# Patient Record
Sex: Female | Born: 1950 | Race: Black or African American | Hispanic: No | Marital: Single | Smoking: Never smoker
Health system: Southern US, Community
[De-identification: ages and names within clinical notes are randomized; demographics above are authoritative.]

## PROBLEM LIST (undated history)

## (undated) DIAGNOSIS — I10 Essential (primary) hypertension: Secondary | ICD-10-CM

## (undated) DIAGNOSIS — B20 Human immunodeficiency virus [HIV] disease: Secondary | ICD-10-CM

---

## 2006-02-14 ENCOUNTER — Emergency Department: Payer: Self-pay | Admitting: Internal Medicine

## 2007-01-26 ENCOUNTER — Emergency Department: Payer: Self-pay | Admitting: Emergency Medicine

## 2007-03-27 ENCOUNTER — Emergency Department: Payer: Self-pay | Admitting: Emergency Medicine

## 2007-03-27 ENCOUNTER — Other Ambulatory Visit: Payer: Self-pay

## 2007-04-03 ENCOUNTER — Emergency Department: Payer: Self-pay | Admitting: Emergency Medicine

## 2007-12-12 ENCOUNTER — Emergency Department: Payer: Self-pay | Admitting: Emergency Medicine

## 2011-06-22 ENCOUNTER — Emergency Department: Payer: Self-pay | Admitting: *Deleted

## 2012-07-08 ENCOUNTER — Emergency Department: Payer: Self-pay | Admitting: Unknown Physician Specialty

## 2012-07-08 LAB — COMPREHENSIVE METABOLIC PANEL
Albumin: 2.5 g/dL — ABNORMAL LOW (ref 3.4–5.0)
Alkaline Phosphatase: 51 U/L (ref 50–136)
BUN: 4 mg/dL — ABNORMAL LOW (ref 7–18)
Calcium, Total: 8.9 mg/dL (ref 8.5–10.1)
Co2: 30 mmol/L (ref 21–32)
EGFR (African American): >60
EGFR (Non-African Amer.): >60
Glucose: 88 mg/dL (ref 65–99)
Osmolality: 265 (ref 275–301)
SGPT (ALT): 30 U/L (ref 12–78)
Sodium: 134 mmol/L — ABNORMAL LOW (ref 136–145)
Total Protein: 7.9 g/dL (ref 6.4–8.2)

## 2012-07-08 LAB — CBC
HGB: 14.2 g/dL (ref 12.0–16.0)
MCH: 33.6 pg (ref 26.0–34.0)
MCHC: 34.9 g/dL (ref 32.0–36.0)
MCV: 96 fL (ref 80–100)
Platelet: 208 10*3/uL (ref 150–440)
RBC: 4.23 10*6/uL (ref 3.80–5.20)
WBC: 4.4 10*3/uL (ref 3.6–11.0)

## 2012-07-08 LAB — LIPASE, BLOOD: Lipase: 239 U/L (ref 73–393)

## 2012-07-14 ENCOUNTER — Emergency Department: Payer: Self-pay | Admitting: Emergency Medicine

## 2012-07-14 LAB — CBC
HGB: 15.5 g/dL (ref 12.0–16.0)
Platelet: 284 10*3/uL (ref 150–440)
RBC: 4.68 10*6/uL (ref 3.80–5.20)

## 2012-07-14 LAB — COMPREHENSIVE METABOLIC PANEL
Alkaline Phosphatase: 55 U/L (ref 50–136)
Co2: 27 mmol/L (ref 21–32)
Creatinine: 0.93 mg/dL (ref 0.60–1.30)
EGFR (African American): >60
EGFR (Non-African Amer.): >60
Glucose: 81 mg/dL (ref 65–99)
Osmolality: 266 (ref 275–301)
Sodium: 135 mmol/L — ABNORMAL LOW (ref 136–145)
Total Protein: 8.7 g/dL — ABNORMAL HIGH (ref 6.4–8.2)

## 2012-11-14 ENCOUNTER — Inpatient Hospital Stay: Payer: Self-pay | Admitting: Internal Medicine

## 2012-11-14 LAB — DRUG SCREEN, URINE
Barbiturates, Ur Screen: NEGATIVE (ref ?–200)
Benzodiazepine, Ur Scrn: NEGATIVE (ref ?–200)
Cocaine Metabolite,Ur ~~LOC~~: POSITIVE (ref ?–300)
MDMA (Ecstasy)Ur Screen: NEGATIVE (ref ?–500)
Opiate, Ur Screen: NEGATIVE (ref ?–300)
Tricyclic, Ur Screen: NEGATIVE (ref ?–1000)

## 2012-11-14 LAB — COMPREHENSIVE METABOLIC PANEL
Alkaline Phosphatase: 60 U/L (ref 50–136)
Anion Gap: 8 (ref 7–16)
Bilirubin,Total: 1.9 mg/dL — ABNORMAL HIGH (ref 0.2–1.0)
Calcium, Total: 9.1 mg/dL (ref 8.5–10.1)
Chloride: 89 mmol/L — ABNORMAL LOW (ref 98–107)
Co2: 33 mmol/L — ABNORMAL HIGH (ref 21–32)
EGFR (Non-African Amer.): >60
Glucose: 105 mg/dL — ABNORMAL HIGH (ref 65–99)
Osmolality: 258 (ref 275–301)
Potassium: 2.2 mmol/L — CL (ref 3.5–5.1)
SGOT(AST): 99 U/L — ABNORMAL HIGH (ref 15–37)
Sodium: 130 mmol/L — ABNORMAL LOW (ref 136–145)

## 2012-11-14 LAB — URINALYSIS, COMPLETE
Bacteria: NONE SEEN
Blood: NEGATIVE
Glucose,UR: NEGATIVE mg/dL (ref 0–75)
Ketone: NEGATIVE
Leukocyte Esterase: NEGATIVE
Nitrite: NEGATIVE
Protein: NEGATIVE
RBC,UR: NONE SEEN /HPF (ref 0–5)
Specific Gravity: 1.004 (ref 1.003–1.030)
WBC UR: <1 /HPF (ref 0–5)

## 2012-11-14 LAB — TROPONIN I: Troponin-I: <0.02 ng/mL

## 2012-11-14 LAB — CBC
MCH: 32.7 pg (ref 26.0–34.0)
MCHC: 35.3 g/dL (ref 32.0–36.0)
RBC: 3.92 10*6/uL (ref 3.80–5.20)
WBC: 3.1 10*3/uL — ABNORMAL LOW (ref 3.6–11.0)

## 2012-11-14 LAB — DIFFERENTIAL
Eosinophil #: 0 10*3/uL (ref 0.0–0.7)
Lymphocyte %: 40 %
Monocyte #: 0.5 x10 3/mm (ref 0.2–0.9)
Neutrophil #: 1.3 10*3/uL — ABNORMAL LOW (ref 1.4–6.5)

## 2012-11-14 LAB — CK TOTAL AND CKMB (NOT AT ARMC): CK-MB: <0.5 ng/mL — ABNORMAL LOW (ref 0.5–3.6)

## 2012-11-15 LAB — COMPREHENSIVE METABOLIC PANEL
Alkaline Phosphatase: 55 U/L (ref 50–136)
Anion Gap: 6 — ABNORMAL LOW (ref 7–16)
Chloride: 94 mmol/L — ABNORMAL LOW (ref 98–107)
EGFR (African American): >60
Osmolality: 262 (ref 275–301)
Potassium: 2.4 mmol/L — CL (ref 3.5–5.1)
SGPT (ALT): 29 U/L (ref 12–78)
Sodium: 132 mmol/L — ABNORMAL LOW (ref 136–145)

## 2012-11-15 LAB — CBC WITH DIFFERENTIAL/PLATELET
Basophil #: 0 10*3/uL (ref 0.0–0.1)
Basophil %: 0.4 %
Eosinophil #: 0.1 10*3/uL (ref 0.0–0.7)
Lymphocyte #: 1.7 10*3/uL (ref 1.0–3.6)
Lymphocyte %: 47.6 %
MCHC: 34.9 g/dL (ref 32.0–36.0)
MCV: 94 fL (ref 80–100)
Monocyte #: 0.7 x10 3/mm (ref 0.2–0.9)
Neutrophil #: 1.1 10*3/uL — ABNORMAL LOW (ref 1.4–6.5)
Neutrophil %: 30.1 %
Platelet: 154 10*3/uL (ref 150–440)
RBC: 3.79 10*6/uL — ABNORMAL LOW (ref 3.80–5.20)
RDW: 13.4 % (ref 11.5–14.5)
WBC: 3.5 10*3/uL — ABNORMAL LOW (ref 3.6–11.0)

## 2012-11-16 LAB — BASIC METABOLIC PANEL
Anion Gap: 6 — ABNORMAL LOW (ref 7–16)
BUN: 2 mg/dL — ABNORMAL LOW (ref 7–18)
Co2: 29 mmol/L (ref 21–32)
Creatinine: 0.88 mg/dL (ref 0.60–1.30)
EGFR (African American): >60
EGFR (Non-African Amer.): >60
Glucose: 98 mg/dL (ref 65–99)
Osmolality: 270 (ref 275–301)
Potassium: 2.8 mmol/L — ABNORMAL LOW (ref 3.5–5.1)
Sodium: 137 mmol/L (ref 136–145)

## 2012-11-16 LAB — PHOSPHORUS: Phosphorus: 1.5 mg/dL — ABNORMAL LOW (ref 2.5–4.9)

## 2012-11-16 LAB — MAGNESIUM: Magnesium: 1.5 mg/dL — ABNORMAL LOW

## 2012-11-17 LAB — COMPREHENSIVE METABOLIC PANEL
Alkaline Phosphatase: 57 U/L (ref 50–136)
BUN: 3 mg/dL — ABNORMAL LOW (ref 7–18)
Bilirubin,Total: 1.5 mg/dL — ABNORMAL HIGH (ref 0.2–1.0)
Chloride: 102 mmol/L (ref 98–107)
Co2: 28 mmol/L (ref 21–32)
EGFR (Non-African Amer.): >60
Glucose: 107 mg/dL — ABNORMAL HIGH (ref 65–99)
Osmolality: 271 (ref 275–301)
Potassium: 3 mmol/L — ABNORMAL LOW (ref 3.5–5.1)
SGOT(AST): 51 U/L — ABNORMAL HIGH (ref 15–37)
Sodium: 137 mmol/L (ref 136–145)
Total Protein: 7.2 g/dL (ref 6.4–8.2)

## 2012-11-17 LAB — PHOSPHORUS: Phosphorus: 1.8 mg/dL — ABNORMAL LOW (ref 2.5–4.9)

## 2012-11-17 LAB — MAGNESIUM: Magnesium: 1.3 mg/dL — ABNORMAL LOW

## 2012-11-18 LAB — PHOSPHORUS: Phosphorus: 1.6 mg/dL — ABNORMAL LOW (ref 2.5–4.9)

## 2012-11-18 LAB — BASIC METABOLIC PANEL
Anion Gap: 6 — ABNORMAL LOW (ref 7–16)
Co2: 26 mmol/L (ref 21–32)
Creatinine: 0.87 mg/dL (ref 0.60–1.30)
EGFR (Non-African Amer.): >60
Sodium: 137 mmol/L (ref 136–145)

## 2012-11-18 LAB — MAGNESIUM: Magnesium: 1.4 mg/dL — ABNORMAL LOW

## 2012-11-18 LAB — DRUG SCREEN, URINE
Amphetamines, Ur Screen: NEGATIVE (ref ?–1000)
Barbiturates, Ur Screen: NEGATIVE (ref ?–200)
Cannabinoid 50 Ng, Ur ~~LOC~~: NEGATIVE (ref ?–50)
Opiate, Ur Screen: POSITIVE (ref ?–300)

## 2012-11-18 LAB — WBCS, STOOL

## 2012-11-19 LAB — CBC WITH DIFFERENTIAL/PLATELET
Eosinophil %: 2.9 %
HCT: 35 % (ref 35.0–47.0)
Lymphocyte #: 1.6 10*3/uL (ref 1.0–3.6)
Lymphocyte %: 48.3 %
MCH: 33.2 pg (ref 26.0–34.0)
Monocyte #: 0.5 x10 3/mm (ref 0.2–0.9)
Monocyte %: 16.6 %
Neutrophil #: 1 10*3/uL — ABNORMAL LOW (ref 1.4–6.5)
Neutrophil %: 31.9 %
Platelet: 137 10*3/uL — ABNORMAL LOW (ref 150–440)
RBC: 3.68 10*6/uL — ABNORMAL LOW (ref 3.80–5.20)

## 2012-11-19 LAB — BASIC METABOLIC PANEL
Anion Gap: 6 — ABNORMAL LOW (ref 7–16)
BUN: 2 mg/dL — ABNORMAL LOW (ref 7–18)
Chloride: 105 mmol/L (ref 98–107)
Co2: 25 mmol/L (ref 21–32)
Creatinine: 0.99 mg/dL (ref 0.60–1.30)
EGFR (African American): >60
EGFR (Non-African Amer.): >60
Glucose: 119 mg/dL — ABNORMAL HIGH (ref 65–99)
Potassium: 3.7 mmol/L (ref 3.5–5.1)
Sodium: 136 mmol/L (ref 136–145)

## 2012-11-19 LAB — PHOSPHORUS: Phosphorus: 1.9 mg/dL — ABNORMAL LOW (ref 2.5–4.9)

## 2012-11-19 LAB — CA 125: CA 125: 9.7 U/mL

## 2012-11-20 LAB — CBC WITH DIFFERENTIAL/PLATELET
Basophil #: 0 10*3/uL (ref 0.0–0.1)
Basophil %: 0.7 %
HGB: 11.8 g/dL — ABNORMAL LOW (ref 12.0–16.0)
Lymphocyte #: 1.9 10*3/uL (ref 1.0–3.6)
MCHC: 34.2 g/dL (ref 32.0–36.0)
MCV: 96 fL (ref 80–100)
Monocyte #: 0.7 x10 3/mm (ref 0.2–0.9)
Monocyte %: 18 %
Neutrophil %: 27.6 %
Platelet: 142 10*3/uL — ABNORMAL LOW (ref 150–440)

## 2012-11-20 LAB — BASIC METABOLIC PANEL
Calcium, Total: 7.9 mg/dL — ABNORMAL LOW (ref 8.5–10.1)
Chloride: 108 mmol/L — ABNORMAL HIGH (ref 98–107)
Co2: 24 mmol/L (ref 21–32)
Creatinine: 0.88 mg/dL (ref 0.60–1.30)
EGFR (African American): >60
Glucose: 90 mg/dL (ref 65–99)
Osmolality: 269 (ref 275–301)
Potassium: 4 mmol/L (ref 3.5–5.1)

## 2012-11-22 LAB — BASIC METABOLIC PANEL
Anion Gap: 9 (ref 7–16)
BUN: 2 mg/dL — ABNORMAL LOW (ref 7–18)
EGFR (African American): >60
Glucose: 79 mg/dL (ref 65–99)
Osmolality: 271 (ref 275–301)
Potassium: 4.4 mmol/L (ref 3.5–5.1)

## 2012-11-26 LAB — BASIC METABOLIC PANEL
BUN: 3 mg/dL — ABNORMAL LOW (ref 7–18)
Calcium, Total: 8.6 mg/dL (ref 8.5–10.1)
Chloride: 106 mmol/L (ref 98–107)
Co2: 25 mmol/L (ref 21–32)
Glucose: 98 mg/dL (ref 65–99)
Osmolality: 272 (ref 275–301)

## 2013-01-05 ENCOUNTER — Emergency Department: Payer: Self-pay | Admitting: Emergency Medicine

## 2013-01-05 LAB — BASIC METABOLIC PANEL
Anion Gap: 8 (ref 7–16)
BUN: 8 mg/dL (ref 7–18)
Calcium, Total: 9.9 mg/dL (ref 8.5–10.1)
Chloride: 100 mmol/L (ref 98–107)
Co2: 27 mmol/L (ref 21–32)
Creatinine: 0.93 mg/dL (ref 0.60–1.30)
EGFR (Non-African Amer.): >60
Osmolality: 268 (ref 275–301)
Potassium: 3 mmol/L — ABNORMAL LOW (ref 3.5–5.1)

## 2013-01-05 LAB — LIPASE, BLOOD: Lipase: 303 U/L (ref 73–393)

## 2013-01-05 LAB — CBC
HGB: 14.2 g/dL (ref 12.0–16.0)
MCH: 32.3 pg (ref 26.0–34.0)
MCHC: 33.9 g/dL (ref 32.0–36.0)
MCV: 95 fL (ref 80–100)
Platelet: 245 10*3/uL (ref 150–440)
RBC: 4.39 10*6/uL (ref 3.80–5.20)
WBC: 5.8 10*3/uL (ref 3.6–11.0)

## 2013-01-05 LAB — TROPONIN I: Troponin-I: <0.02 ng/mL

## 2013-01-06 LAB — DRUG SCREEN, URINE
Barbiturates, Ur Screen: NEGATIVE (ref ?–200)
Cannabinoid 50 Ng, Ur ~~LOC~~: NEGATIVE (ref ?–50)
Cocaine Metabolite,Ur ~~LOC~~: POSITIVE (ref ?–300)
MDMA (Ecstasy)Ur Screen: NEGATIVE (ref ?–500)
Methadone, Ur Screen: NEGATIVE (ref ?–300)
Phencyclidine (PCP) Ur S: NEGATIVE (ref ?–25)

## 2013-01-06 LAB — ETHANOL: Ethanol: 122 mg/dL

## 2013-01-09 ENCOUNTER — Ambulatory Visit: Payer: Self-pay | Admitting: Internal Medicine

## 2013-01-24 ENCOUNTER — Emergency Department: Payer: Self-pay | Admitting: Emergency Medicine

## 2013-01-24 LAB — URINALYSIS, COMPLETE
Bilirubin,UR: NEGATIVE
Ketone: NEGATIVE
Leukocyte Esterase: NEGATIVE
Nitrite: NEGATIVE
Ph: 7 (ref 4.5–8.0)
Protein: NEGATIVE
RBC,UR: 1 /HPF (ref 0–5)
Specific Gravity: 1.006 (ref 1.003–1.030)
Squamous Epithelial: 8
WBC UR: 1 /HPF (ref 0–5)

## 2013-01-24 LAB — COMPREHENSIVE METABOLIC PANEL
Albumin: 2.9 g/dL — ABNORMAL LOW (ref 3.4–5.0)
Alkaline Phosphatase: 68 U/L
BUN: 5 mg/dL — ABNORMAL LOW (ref 7–18)
Calcium, Total: 9.5 mg/dL (ref 8.5–10.1)
Chloride: 101 mmol/L (ref 98–107)
Co2: 31 mmol/L (ref 21–32)
Creatinine: 0.84 mg/dL (ref 0.60–1.30)
EGFR (African American): >60
EGFR (Non-African Amer.): >60
Glucose: 83 mg/dL (ref 65–99)
SGPT (ALT): 34 U/L (ref 12–78)
Sodium: 137 mmol/L (ref 136–145)
Total Protein: 8.6 g/dL — ABNORMAL HIGH (ref 6.4–8.2)

## 2013-01-24 LAB — DRUG SCREEN, URINE
Amphetamines, Ur Screen: NEGATIVE (ref ?–1000)
Barbiturates, Ur Screen: NEGATIVE (ref ?–200)
Benzodiazepine, Ur Scrn: NEGATIVE (ref ?–200)
Cannabinoid 50 Ng, Ur ~~LOC~~: NEGATIVE (ref ?–50)
MDMA (Ecstasy)Ur Screen: NEGATIVE (ref ?–500)
Methadone, Ur Screen: NEGATIVE (ref ?–300)
Opiate, Ur Screen: POSITIVE (ref ?–300)
Phencyclidine (PCP) Ur S: NEGATIVE (ref ?–25)

## 2013-01-24 LAB — CBC
HCT: 43.6 % (ref 35.0–47.0)
HGB: 14.7 g/dL (ref 12.0–16.0)
MCH: 32.3 pg (ref 26.0–34.0)
MCHC: 33.8 g/dL (ref 32.0–36.0)
Platelet: 199 10*3/uL (ref 150–440)

## 2013-02-01 ENCOUNTER — Inpatient Hospital Stay: Payer: Self-pay | Admitting: Internal Medicine

## 2013-02-01 LAB — URINALYSIS, COMPLETE
Bilirubin,UR: NEGATIVE
Hyaline Cast: 1
Ketone: NEGATIVE
Nitrite: NEGATIVE
Squamous Epithelial: 5
WBC UR: 2 /HPF (ref 0–5)

## 2013-02-01 LAB — CBC WITH DIFFERENTIAL/PLATELET
Eosinophil %: 3 %
HCT: 47.7 % — ABNORMAL HIGH (ref 35.0–47.0)
HGB: 16 g/dL (ref 12.0–16.0)
Lymphocyte #: 1 10*3/uL (ref 1.0–3.6)
MCH: 32.5 pg (ref 26.0–34.0)
MCHC: 33.7 g/dL (ref 32.0–36.0)
Neutrophil %: 55.6 %
Platelet: 215 10*3/uL (ref 150–440)
RBC: 4.94 10*6/uL (ref 3.80–5.20)
RDW: 14.1 % (ref 11.5–14.5)
WBC: 4 10*3/uL (ref 3.6–11.0)

## 2013-02-01 LAB — COMPREHENSIVE METABOLIC PANEL
Albumin: 3.2 g/dL — ABNORMAL LOW (ref 3.4–5.0)
Alkaline Phosphatase: 71 U/L
Anion Gap: 7 (ref 7–16)
Calcium, Total: 9.1 mg/dL (ref 8.5–10.1)
Chloride: 98 mmol/L (ref 98–107)
Creatinine: 0.93 mg/dL (ref 0.60–1.30)
EGFR (African American): >60
EGFR (Non-African Amer.): >60
Glucose: 73 mg/dL (ref 65–99)
Osmolality: 265 (ref 275–301)
SGPT (ALT): 31 U/L (ref 12–78)

## 2013-02-01 LAB — RAPID INFLUENZA A&B ANTIGENS

## 2013-02-01 LAB — CK TOTAL AND CKMB (NOT AT ARMC)
CK, Total: 199 U/L (ref 21–215)
CK-MB: <0.5 ng/mL — ABNORMAL LOW (ref 0.5–3.6)

## 2013-02-01 LAB — TROPONIN I: Troponin-I: <0.02 ng/mL

## 2013-02-01 LAB — LIPASE, BLOOD: Lipase: 107 U/L (ref 73–393)

## 2013-02-02 LAB — BASIC METABOLIC PANEL
Calcium, Total: 8.6 mg/dL (ref 8.5–10.1)
Co2: 30 mmol/L (ref 21–32)
EGFR (Non-African Amer.): >60
Glucose: 93 mg/dL (ref 65–99)
Osmolality: 268 (ref 275–301)
Potassium: 2.5 mmol/L — CL (ref 3.5–5.1)

## 2013-02-02 LAB — CBC WITH DIFFERENTIAL/PLATELET
Basophil %: 0.5 %
Eosinophil %: 2.8 %
HCT: 38.8 % (ref 35.0–47.0)
Lymphocyte #: 0.8 10*3/uL — ABNORMAL LOW (ref 1.0–3.6)
Lymphocyte %: 24.7 %
MCV: 96 fL (ref 80–100)
Neutrophil %: 43.6 %
Platelet: 169 10*3/uL (ref 150–440)
RBC: 4.04 10*6/uL (ref 3.80–5.20)

## 2013-02-02 LAB — RAPID INFLUENZA A&B ANTIGENS

## 2013-02-03 LAB — CBC WITH DIFFERENTIAL/PLATELET
Basophil #: 0 10*3/uL (ref 0.0–0.1)
HCT: 40.1 % (ref 35.0–47.0)
Lymphocyte %: 32.6 %
MCH: 33.3 pg (ref 26.0–34.0)
MCHC: 34.6 g/dL (ref 32.0–36.0)
Monocyte %: 24.6 %
Neutrophil #: 1.5 10*3/uL (ref 1.4–6.5)
Neutrophil %: 41.1 %
Platelet: 157 10*3/uL (ref 150–440)
WBC: 3.6 10*3/uL (ref 3.6–11.0)

## 2013-02-03 LAB — BASIC METABOLIC PANEL
Anion Gap: 3 — ABNORMAL LOW (ref 7–16)
BUN: 5 mg/dL — ABNORMAL LOW (ref 7–18)
Calcium, Total: 8.3 mg/dL — ABNORMAL LOW (ref 8.5–10.1)
Co2: 35 mmol/L — ABNORMAL HIGH (ref 21–32)
Creatinine: 1.28 mg/dL (ref 0.60–1.30)
EGFR (Non-African Amer.): 45 — ABNORMAL LOW
Glucose: 92 mg/dL (ref 65–99)
Osmolality: 263 (ref 275–301)
Sodium: 133 mmol/L — ABNORMAL LOW (ref 136–145)

## 2013-02-03 LAB — CLOSTRIDIUM DIFFICILE(ARMC)

## 2013-02-03 LAB — POTASSIUM: Potassium: 2.9 mmol/L — ABNORMAL LOW (ref 3.5–5.1)

## 2013-02-03 LAB — MAGNESIUM: Magnesium: 1.5 mg/dL — ABNORMAL LOW

## 2013-02-04 LAB — COMPREHENSIVE METABOLIC PANEL
Alkaline Phosphatase: 44 U/L — ABNORMAL LOW
BUN: 8 mg/dL (ref 7–18)
Bilirubin,Total: 0.8 mg/dL (ref 0.2–1.0)
Chloride: 99 mmol/L (ref 98–107)
Co2: 31 mmol/L (ref 21–32)
EGFR (Non-African Amer.): 59 — ABNORMAL LOW
Glucose: 91 mg/dL (ref 65–99)
Potassium: 2.8 mmol/L — ABNORMAL LOW (ref 3.5–5.1)
SGOT(AST): 80 U/L — ABNORMAL HIGH (ref 15–37)
Sodium: 134 mmol/L — ABNORMAL LOW (ref 136–145)
Total Protein: 7.1 g/dL (ref 6.4–8.2)

## 2013-02-04 LAB — VANCOMYCIN, TROUGH: Vancomycin, Trough: 8 ug/mL — ABNORMAL LOW (ref 10–20)

## 2013-02-04 LAB — CBC WITH DIFFERENTIAL/PLATELET
Eosinophil %: 0 %
HCT: 39.5 % (ref 35.0–47.0)
HGB: 13.4 g/dL (ref 12.0–16.0)
Lymphocyte %: 30.3 %
MCHC: 34 g/dL (ref 32.0–36.0)
RDW: 13.3 % (ref 11.5–14.5)

## 2013-02-04 LAB — PHOSPHORUS: Phosphorus: 2.5 mg/dL (ref 2.5–4.9)

## 2013-02-05 LAB — BASIC METABOLIC PANEL
Anion Gap: 6 — ABNORMAL LOW (ref 7–16)
Calcium, Total: 8.5 mg/dL (ref 8.5–10.1)
Co2: 32 mmol/L (ref 21–32)
EGFR (African American): >60
EGFR (Non-African Amer.): >60
Glucose: 160 mg/dL — ABNORMAL HIGH (ref 65–99)
Potassium: 2.9 mmol/L — ABNORMAL LOW (ref 3.5–5.1)

## 2013-02-05 LAB — CBC WITH DIFFERENTIAL/PLATELET
Basophil %: 0.1 %
Eosinophil #: 0 10*3/uL (ref 0.0–0.7)
HCT: 42.7 % (ref 35.0–47.0)
HGB: 14.4 g/dL (ref 12.0–16.0)
MCHC: 33.8 g/dL (ref 32.0–36.0)
MCV: 96 fL (ref 80–100)
Monocyte #: 0.5 x10 3/mm (ref 0.2–0.9)
Monocyte %: 11.8 %
Neutrophil #: 2.7 10*3/uL (ref 1.4–6.5)
RBC: 4.46 10*6/uL (ref 3.80–5.20)
RDW: 13.6 % (ref 11.5–14.5)

## 2013-02-06 LAB — BASIC METABOLIC PANEL
BUN: 12 mg/dL (ref 7–18)
Chloride: 90 mmol/L — ABNORMAL LOW (ref 98–107)
Co2: 31 mmol/L (ref 21–32)
Glucose: 143 mg/dL — ABNORMAL HIGH (ref 65–99)
Osmolality: 267 (ref 275–301)

## 2013-02-06 LAB — CREATININE, SERUM
Creatinine: 1.06 mg/dL (ref 0.60–1.30)
EGFR (African American): >60
EGFR (Non-African Amer.): 57 — ABNORMAL LOW

## 2013-02-07 LAB — VANCOMYCIN, TROUGH: Vancomycin, Trough: 2 ug/mL — ABNORMAL LOW (ref 10–20)

## 2013-02-09 LAB — BASIC METABOLIC PANEL
Anion Gap: 5 — ABNORMAL LOW (ref 7–16)
BUN: 15 mg/dL (ref 7–18)
Calcium, Total: 7.9 mg/dL — ABNORMAL LOW (ref 8.5–10.1)
Chloride: 104 mmol/L (ref 98–107)
Co2: 25 mmol/L (ref 21–32)
Creatinine: 0.9 mg/dL (ref 0.60–1.30)
EGFR (African American): >60
EGFR (Non-African Amer.): >60
Glucose: 132 mg/dL — ABNORMAL HIGH (ref 65–99)
Osmolality: 271 (ref 275–301)
Potassium: 4.2 mmol/L (ref 3.5–5.1)
Sodium: 134 mmol/L — ABNORMAL LOW (ref 136–145)

## 2013-02-09 LAB — MAGNESIUM: Magnesium: 2 mg/dL

## 2013-02-10 LAB — BASIC METABOLIC PANEL
Anion Gap: 5 — ABNORMAL LOW (ref 7–16)
BUN: 16 mg/dL (ref 7–18)
CREATININE: 0.86 mg/dL (ref 0.60–1.30)
Calcium, Total: 8.6 mg/dL (ref 8.5–10.1)
Chloride: 102 mmol/L (ref 98–107)
Co2: 27 mmol/L (ref 21–32)
EGFR (Non-African Amer.): >60
GLUCOSE: 150 mg/dL — AB (ref 65–99)
OSMOLALITY: 272 (ref 275–301)
Potassium: 4.8 mmol/L (ref 3.5–5.1)
Sodium: 134 mmol/L — ABNORMAL LOW (ref 136–145)

## 2013-03-12 ENCOUNTER — Ambulatory Visit: Payer: Self-pay | Admitting: Internal Medicine

## 2014-06-01 NOTE — Consult Note (Signed)
Chief Complaint:  Subjective/Chief Complaint patient states abdominal pain is better today, mild nausea, no emesis, still loose stool.   VITAL SIGNS/ANCILLARY NOTES: **Vital Signs.:   09-Oct-14 00:58  Vital Signs Type Q 4hr  Temperature Temperature (F) 97.6  Celsius 36.4  Pulse Pulse 79  Respirations Respirations 18  Systolic BP Systolic BP 99  Diastolic BP (mmHg) Diastolic BP (mmHg) 65  Mean BP 76  Pulse Ox % Pulse Ox % 95  Pulse Ox Activity Level  At rest  Oxygen Delivery Room Air/ 21 %    06:03  Vital Signs Type Routine  Temperature Temperature (F) 97.5  Celsius 36.3  Temperature Source axillary  Pulse Pulse 82  Respirations Respirations 18  Systolic BP Systolic BP 419  Diastolic BP (mmHg) Diastolic BP (mmHg) 72  Mean BP 84  Pulse Ox % Pulse Ox % 94  Pulse Ox Activity Level  At rest  Oxygen Delivery Room Air/ 21 %    08:04  Telemetry pattern Cardiac Rhythm Normal sinus rhythm; pattern reported by Telemetry Clerk; 78    14:15  Vital Signs Type Routine  *Intake and Output.:   09-Oct-14 03:21  Stool  large watery stool    06:20  Stool  small watery    09:54  Stool  large watery stool    10:52  Stool  med loose watery stool   Brief Assessment:  Cardiac Regular   Respiratory clear BS   Gastrointestinal details normal Soft  Nondistended  Bowel sounds normal  minimal diffuse discomfort   Lab Results: Hepatic:  09-Oct-14 06:20   Bilirubin, Total  1.5  Alkaline Phosphatase 57  SGPT (ALT) 23  SGOT (AST)  51  Total Protein, Serum 7.2  Albumin, Serum  2.0  Routine Chem:  09-Oct-14 06:20   Glucose, Serum  107  BUN  3  Creatinine (comp) 0.94  Sodium, Serum 137  Potassium, Serum  3.0  Chloride, Serum 102  CO2, Serum 28  Calcium (Total), Serum  8.0  Osmolality (calc) 271  eGFR (African American) >60  eGFR (Non-African American) >60 (eGFR values <56m/min/1.73 m2 may be an indication of chronic kidney disease (CKD). Calculated eGFR is useful in patients  with stable renal function. The eGFR calculation will not be reliable in acutely ill patients when serum creatinine is changing rapidly. It is not useful in  patients on dialysis. The eGFR calculation may not be applicable to patients at the low and high extremes of body sizes, pregnant women, and vegetarians.)  Anion Gap 7  Phosphorus, Serum  1.8 (Result(s) reported on 17 Nov 2012 at 07:04AM.)  Magnesium, Serum  1.3 (1.8-2.4 THERAPEUTIC RANGE: 4-7 mg/dL TOXIC: > 10 mg/dL  -----------------------)   Radiology Results: [place]    09-Oct-14 14:36, [place] Exam with Transvaginal - NON[place]OB  [place] Ultras[place] Exam with Transvaginal - NON-OB   REASON FOR EXAM:    left ovarian mass  COMMENTS:       PROCEDURE: [place] - [place]PELVIS EXAM W/TRANSVAGINAL  - Nov 17 2012  2:36PM     RESULT: There is a history of previous hysterectomy. Transabdominal and   endovaginal imaging are performed. The kidneys appear to be grossly   normal. Is a complex area in the posterior midline region posterior to   the bladder measuring 3.79 x 2.53 x 3.5 to centimeters no blood flow is   demonstrated within this structure. The ovaries are not demonstrated. Is   a complex structure with internal echoes of  a mosaic a heterogeneous   pattern smooth margins.    IMPRESSION:  Relatively hypoechoic complex mass posterior to the bladder   in the midline as described. Etiology of this is uncertain. Normal     appearing ovaries are not seen. The uterus has been removed. When   compared to the previous CT of 11/14/2012, the calcifications seen   peripherally on the CT is not definitely appreciated. Gynecologic   consultation and followup is recommended.    Dictation Site:2        Verified By: Sundra Aland, M.D., MD   Assessment/Plan:  Assessment/Plan:  Assessment 1) diarrhea, chronic, etiology uncertain.  of note, CT uninformative re lymphadenopathy, biliary disease, but showing heptic steatosis c/w  fatty liver/etoh use, cholelithiasis, evidence of "mesenteritis".   awaiting results of colonoscopy recently done at [facility].  stool cultures negative, o+p pending.  possibility of diarrhea complicated by stribild side effect.   Plan 1) continue course of abx for 7-10 days.   2) stool O+P times 2-3.  The cd 4 count is high enough that many atypical organisms are not as much a consideration 3) awaiting colonoscopy results from [facility] 4) symptomatic treatment with loperamide or lomotil.   Electronic Signatures: Loistine Simas (MD)  (Signed 09-Oct-14 16:12)  Authored: Chief Complaint, VITAL SIGNS/ANCILLARY NOTES, Brief Assessment, Lab Results, Radiology Results, Assessment/Plan   Last Updated: 09-Oct-14 16:12 by Loistine Simas (MD)

## 2014-06-01 NOTE — H&P (Signed)
PATIENT NAME:  [name], GLAUS MR#:  [id number] DATE OF BIRTH:  [birth year]  DATE OF ADMISSION:  11/14/2012  DISCHARGE DIAGNOSIS: 11/22/2012 (Anticipated transfer)  ADMITTING DIAGNOSES: 1.  Syncope. 2.  Diarrhea.  3.  Hypotension.  4.  Hypokalemia.  5.  Hypomagnesemia.  6.  Severe dehydration.  7.  Leukopenia.  8.  Complex pelvic mass status post GYN evaluation.  9.  Human immunodeficiency virus with good control, CD4 count of 650.  10.  History of polysubstance abuse including cocaine.  11.  History of chronic hepatitis B as well as C.  12.  History of abnormal liver function tests. 13.  Abnormal cortisol level but appropriate ACTH stim test.   PERTINENT LABS AND EVALUATIONS: Admitting glucose was 105, BUN 4, creatinine 0.96, sodium 130, potassium 2.2, chloride 89, CO2 33, calcium 9.1. Magnesium 1.3. LFTs showed a total protein of 7.9, albumin of 2.4, bili total 1.9, alk phos 60, AST 99 and ALT 39. Troponin less than 0.05 x 3. TUDS were positive for cocaine. Most recent potassium is 4.4. CBC showed a WBC of 3.1, hemoglobin 12.8 and platelet count 170. The patient's stool for C. diff was negative. Stool comprehensive showed no Salmonella, no shigellae, no RBCs, no WBCs noted. Stool for Crypto spore: None seen. Stool for Cryptosporidium smear as well as isospore smear: None seen. Micro sporidia: Results are still pending. Giardia is negative. CD4 count is 300. Cortisol baseline 5.9. Subsequent with ACTH stim at 30 minutes was 15.6, 60 minutes was 16.8.  HIV-1 RNA by PCR was 640 copies. CMV IgM was less than 30. CT scan of the abdomen showed nonspecific mesenteric fat stranding as can be seen with vascular congestion versus enteritis versus mesenteric panniculitis. Mildly complex cystic left ovarian mass. Differential included ovarian neoplasm. Cholelithiasis and hepatic steatosis was also noted. Pelvis ultrasound showed relatively hypoechoic complex mass posterior to bladder in the midline. Normal  appearing ovaries are not seen. Bladder in the midline.   CONSULTANTS: Dr. Gustavo Lah.   HOSPITAL COURSE: Please refer to H and P done by the admitting physician. The patient is a 64 year old African American female with history of HIV who is currently on treatment with CD4 count of 300. Was brought into the hospital after she had collapsed with fainting x 2 today.  She was complaining of significant weight loss and also has complained of diarrhea that has been going on. She was admitted for further evaluation and treatment. She was noted to have hyponatremia, severe hypokalemia and hypomagnesemia, which were treated. The patient, because of her diarrhea and hypotension, was seen in consultation by GI. She underwent an extensive work-up with studies. No real source was found. The patient had a sigmoidoscopy. The plan was to do a colonoscopy, but anesthesiology stated that they would not do a colonoscopy in light of the patient having cocaine in her system, would not give her anesthesia. Therefore, a sigmoidoscopy was done and the findings on the sigmoidoscopy were negative. No biopsies were taken. The patient continues to have significant diarrhea despite being on empiric antibiotics with Flagyl and Levaquin. No real source is identified. At this time, we have no ID available here; therefore, she is being transferred to [facility] where she has followed up in the past. Her HIV medication has also been held. The patient also was noted to have a pelvic mass. She was seen by GYN. They recommended CA-125 levels. However, these were never ordered. There is an unclear cause, etiology for this mass. This will need  to be further worked up. Due to the patient also having persistent hypotension when the fluids are discontinued, she had a cortisol level checked which was relatively low in light of her hypotension, but ACTH stimulation was normal. At this time, I have made arrangements for her to be transferred to [facility] due to the  patient not showing much improvement.   MEDICATIONS: At the time of transfer. 1.  Tylenol 650 mg q. 4 p.r.n. 2.  Heparin 5000 sub-Q q. 8. 3.  Ambien 5 mg at bedtime p.r.n. 4.  Zofran 4 mg q. 6 p.r.n.  5.  Mag oxide 400 mg q. 8. 6.  Oxycodone 5 mg q. 4 p.r.n.  7.  KCl 40 mEq q. 8. 8.  Potassium phosphate 1 packet q. 12. 9.  Loperamide 2 mg q.i.d., scheduled.  DISCHARGE DIET:  Low fat.  TIME SPENT: 45 minutes. ____________________________ Alisha Mosses Posey Pronto, MD shp:sb D: 11/22/2012 11:52:09 ET T: 11/22/2012 12:15:01 ET JOB#: [id number]  cc: Keller Mikels H. Posey Pronto, MD, <Dictator>

## 2014-06-01 NOTE — H&P (Signed)
PATIENT NAME:  [name], [name] DATE OF BIRTH:  Apr 30, 1950  DATE OF ADMISSION:  11/14/2012  PRIMARY CARE PHYSICIAN: [facility].    HISTORY OF PRESENT ILLNESS:  The patient is a 64 year old African American female whose past medical history is significant for a history of HIV, history of chronic hepatitis B as well as C, liver enzyme abnormalities, leukopenia, who presents to the hospital with complaints of abdominal pain, as well as fainting twice today.  According to the patient, she was doing well up until today. When she was walking to the bathroom, she felt lightheaded as well as dizzy, felt presyncopal and she, in fact, syncopized at least twice today.  She admits to having some cold and hot chills. She feels very tired. She has been losing weight. She does not know exactly how much she lost, but she says that she lost from 250 down to less than 200 in the past few months. She has been having diarrhea, poor oral intake, as well as lower abdominal pain which, according to the patient, started in the Emergency Room. However, looking back into her past medical history, it appears that she, in fact, had some abdominal pains in the past. Admits to having some blurring of vision earlier today prior to syncope. Also admits to cough with whitish thick phlegm, admits to palpitations intermittently and 2 syncopal episodes earlier today, nausea and vomiting intermittently at home, also diarrhea, as mentioned above, for the past few months. In the past, she was constipated. She was given some medications to help her with constipation; however, now she has been having problems with diarrhea. Admits of having some cramps in her hands.   REVIEW OF SYSTEMS: GENERAL:  Denies any fevers, weight gain. No gastritis.  EYES:  Denies any double vision, glaucoma, cataracts.  ENT: Denies any tinnitus, allergies, epistaxis, sinus pain, dentures or difficulty swallowing.  RESPIRATORY:  Denies any wheezes, asthma, COPD.    CARDIOVASCULAR:  Denies any chest pain, orthopnea, arrhythmias, palpitations.   GASTROINTESTINAL:  Denies any hematemesis, rectal bleeding, change in bowel habits.  GENITOURINARY: Denies dysuria, hematuria, frequency or incontinence.  ENDOCRINOLOGY: Denies polydipsia, nocturia, thyroid problems, heat or cold intolerance or thirst.  HEMATOLOGY: Denies anemia, easy bruising, bleeding, swollen glands.   SKIN:  Denies any acne, rashes, change in moles.  MUSCULOSKELETAL: Denies arthritis, cramps, swelling. NEUROLOGIC: No numbness, epilepsy or tremor.  PSYCHIATRIC: Denies anxiety, insomnia.    PHYSICAL EXAMINATION VITAL SIGNS:  On arrival to the hospital: Temperature was 98.1, pulse was 64, respiration rate was 18, blood pressure 108/59, saturation was 95% on room air. However, the patient's vital signs were rechecked and her blood pressure dropped down to 95/55 with a stable pulse of 68 later in the day.  GENERAL: This is a well-developed, well-nourished African American female in moderate distress. She is moaning and groaning, lying on the stretcher.  HEENT: Pupils equal, reactive to light. Extraocular movements are intact. No icterus or conjunctivitis. Has normal hearing. No pharyngeal erythema. Mucosa is very dry. Poor dentition, no upper plate was noted NECK:  No masses. Supple. Nontender. Thyroid not enlarged. No adenopathy. No JVD or carotid bruits bilaterally. Full range of motion.  LUNGS: Clear to auscultation in all fields. No rales, rhonchi, diminished breath sounds. No wheezing. No labored inspirations, increased effort, dullness to percussion or overt respiratory distress.  CARDIOVASCULAR: S1, S2 appreciated. (Dictation Anomaly) Rythm is regular.  CHEST: Nontender to palpation.  EXTREMITIES: 1+ pedal pulses. No lower extremity edema, calf tenderness  or cyanosis was noted.  ABDOMEN: Soft. Tender diffusely, but mostly on the left side. Some voluntary guarding was noted on the left side,  but not on the right. No hepatosplenomegaly or masses were noted. Bowel sounds were present, though diminished.   RECTAL: Deferred.    MUSCLE STRENGTH: Able to move all extremities. No sign of degenerative joint disease. SKIN:  No evidence of rashes, lesions, erythema, nodularity or induration. It was warm and dry to palpation.  LYMPHATIC: No adenopathy in the cervical region.  NEUROLOGIC: Cranial nerves grossly intact. Sensory is intact. No dysarthria or aphasia. The patient is alert. Oriented to time, person and place. Cooperative. Memory is somewhat impaired. She is not confused, however, intermittently she is poorly cooperative and she just moans and does not answer questions.   ELECTROCARDIOGRAM: The patient's EKG showed normal sinus rhythm at 63 beats per minute, normal axis, T wave abnormality; consider anterior ischemia according to EKG criteria. Prolonged QTc to 605 msec. No EKG to compare with.   LABORATORY STUDIES: BMP showed a sodium of 130, potassium 2.2, bicarbonate level elevated to 33, magnesium 1.3, glucose 105; otherwise, BMP was unremarkable. The patient's liver enzymes revealed albumin level of 2.4, which is about the same as it has been in the past. Total bilirubin 1.9. AST elevated to 99. The patient's cardiac enzymes were negative. White blood cell count was low at 3.1, hemoglobin was 12.8 and platelet count 170; differential was not done.   RADIOLOGIC STUDIES:  Chest x-ray, PA and lateral, 11/14/2012, showed bilateral lung base subsegmental atelectasis.   ASSESSMENT AND PLAN: 1.  Hypotension, very likely due to diarrhea. However, cannot rule out related to sepsis; so, will admit the patient to the medical floor, continue IV fluids for now, follow her blood pressure readings, hold her blood pressure medications.  2.  Hypokalemia, supplement IV.  3.  Hypomagnesemia, supplement IV.  4.  Chronic diarrhea. We will get stool cultures as well as Clostridium difficile and (Dictation  Anomaly) cryptosporidium as the patient has a history of human immunodeficiency virus. Will also get CT scan of abdomen done.  5.  Dehydration. We will continue IV fluids. We will continue clear liquid diet.  6.  Leukopenia, chronic, likely related to human immunodeficiency virus per medical records. We will follow.  7.  History of cocaine abuse. Get a urine drug screen.  8.  Questionable pneumonia with abnormal sputum, increasing sputum according to the patient, which is white and thick. We will get sputum cultures and will start the patient on antibiotic therapy with Rocephin and Zithromax.  9.  Human immunodeficiency virus.  We will continue the patient's medications. We will also get a CD4 count, as well as viral load.   TIME SPENT:  One hour.    ____________________________ Katharina Caper, MD rv:cs D: 11/14/2012 16:37:00 ET T: 11/14/2012 18:13:41 ET JOB#: [id number]  cc: Olga Millers, MD, <Dictator>   Diangelo Radel MD ELECTRONICALLY SIGNED 12/27/2012 19:23

## 2014-06-01 NOTE — Consult Note (Signed)
Chief Complaint:  Subjective/Chief Complaint Please see full GI consult and brief consult note./  Patietn seen and examined.  Patietn admittedn with hypotension, electrolyte derangement and diarrhea, with syncopal/presyncopal symptoms.   Patietn is HIV+, hep c and b, with ongoing polysubstance abuse.  Followed at [facility] for infectious dz.  Stated diarrhea is at least 3 months, and had her HIV meds chaged to all-in-one stribild at about that time?   Of note the more common GI s/e of this agent is diarrhea.  Currently on abx, cultures negative. Agree with doing O+P times three for better statistical validity.   She states she had a colonoscopy at [facility] within the past 4-5 months and I have requested records.  Likely the proceedure was due to the diarrhea and knowlege of that evaluation and w/u would be helpful.  Further recs to follow.   VITAL SIGNS/ANCILLARY NOTES: **Vital Signs.:   08-Oct-14 15:07  Vital Signs Type Routine  Temperature Temperature (F) 98.4  Celsius 36.8  Temperature Source axillary  Pulse Pulse 81  Respirations Respirations 20  Systolic BP Systolic BP 465  Diastolic BP (mmHg) Diastolic BP (mmHg) 75  Mean BP 84  Pulse Ox % Pulse Ox % 96  Pulse Ox Activity Level  At rest  Oxygen Delivery Room Air/ 21 %   Brief Assessment:  Cardiac Regular   Respiratory clear BS  intermittant coarse rhonchi bilateral   Gastrointestinal details normal Soft  Nontender  Nondistended  Bowel sounds normal   Lab Results: Hepatic:  06-Oct-14 13:09   Bilirubin, Total  1.9  Alkaline Phosphatase 60  SGPT (ALT) 39  SGOT (AST)  99  Total Protein, Serum 7.9  Albumin, Serum  2.4  07-Oct-14 04:56   Bilirubin, Total  2.1  Alkaline Phosphatase 55  SGPT (ALT) 29  SGOT (AST)  71  Total Protein, Serum 7.3  Albumin, Serum  2.1  Routine Micro:  06-Oct-14 23:29   Micro Text Report STOOL COMPREHENSIVE   COMMENT                   HOLDING FOR POSSIBLE PATHOGEN   COMMENT                   NO PATHOGENIC  E.COLI DETECTED   COMMENT                   NO CAMPYLOBACTER ANTIGEN DETECTED   ANTIBIOTIC                        Micro Text Report CLOS.DIFF ASSAY, RT-PCR   COMMENT                   NEGATIVE-CLOS.DIFFICILE TOXIN NOT DETECTED BY PCR   ANTIBIOTIC                       General Ref:  08-Oct-14 09:13   Cytology, PCP Stain ========== TEST NAME ==========  ========= RESULTS =========  = REFERENCE RANGE =  PCP,SPECIMEN STAIN,CYTO   Routine Chem:  07-Oct-14 04:56   Sodium, Serum  132  Potassium, Serum  2.4  Chloride, Serum  94  CO2, Serum 32  Anion Gap  6  08-Oct-14 06:10   Glucose, Serum 98  BUN  2  Creatinine (comp) 0.88  Sodium, Serum 137  Potassium, Serum  2.8  Chloride, Serum 102  CO2, Serum 29  Calcium (Total), Serum  8.3  Anion Gap  6  Osmolality (  calc) 270  eGFR (African American) >60  eGFR (Non-African American) >60 (eGFR values <55m/min/1.73 m2 may be an indication of chronic kidney disease (CKD). Calculated eGFR is useful in patients with stable renal function. The eGFR calculation will not be reliable in acutely ill patients when serum creatinine is changing rapidly. It is not useful in  patients on dialysis. The eGFR calculation may not be applicable to patients at the low and high extremes of body sizes, pregnant women, and vegetarians.)  Magnesium, Serum  1.5 (1.8-2.4 THERAPEUTIC RANGE: 4-7 mg/dL TOXIC: > 10 mg/dL  -----------------------)  Phosphorus, Serum  1.5 (Result(s) reported on 16 Nov 2012 at [facility])    09:13   Result Comment PCP, CYTOLOGY - CANCEL - REORDERED IN SCM. VERIFIED IN  - ERROR FOR WRONG SPECIMEN.  Result(s) reported on 16 Nov 2012 at 01:28PM.  Urine Drugs:  06-Oct-14 18:25   Cocaine Metabolite, Urine Qual. POSITIVE   Radiology Results: CT:    06-Oct-14 16:39, CT Abdomen and Pelvis Without Contrast  CT Abdomen and Pelvis Without Contrast   REASON FOR EXAM:    (1) hypotension, dehydration, L sided abdominal pain   , 2 months  long diarrhea, w  COMMENTS:       PROCEDURE: CT  - CT ABDOMEN AND PELVIS W0  - Nov 14 2012  4:39PM     RESULT: Indication: Hypotension, dehydration    Comparison: 07/14/2012    Technique: Multiple axial images from the lung bases to the symphysis   pubis were obtained without oral and without intravenous contrast.    Findings:  The lung bases are clear. There is no pleural or pericardial effusions.    No renal, ureteral, or bladder calculi. No obstructive uropathy. No   perinephric stranding is seen. The kidneys are symmetric in size without   evidence for exophytic mass. The bladder is unremarkable.    The liver is diffusely low in attenuation likely secondary to hepatic   steatosis. There are cholelithiasis. The spleen demonstrates no focal   abnormality. The adrenal glands and pancreas are normal. There is a 4.2   cm left ovarian mass of indeterminate etiology.    The unopacified stomach, duodenum, small intestine, and large intestine   are unremarkable, but evaluation is limited by lack of oral contrast.   There is non-specific mesenteric fat stranding. There is no   pneumoperitoneum, pneumatosis, or portal venous gas. There is no     abdominal or pelvic free fluid. There is no lymphadenopathy.     The abdominal aorta is normal in caliber with atherosclerosis. There are   renal collateral vessels present 2    The osseous structures are unremarkable.    IMPRESSION:     1. Nonspecific mesenteric fat stranding as can be seen with vascular   congestion and versus with enteritis versus mesenteric panniculitis.    2. There is a mildly complex cystic left ovarian mass. Differential   considerations include ovarian neoplasm. Recommend GYN consultation.  3. Cholelithiasis.    4. Hepatic steatosis.    Dictation Site: 1        Verified By: HJennette Banker M.D., MD   Assessment/Plan:  Assessment/Plan:  Assessment as above.   Electronic Signatures: SLoistine Simas (MD)  (Signed 08-Oct-14 17:59)  Authored: Chief Complaint, VITAL SIGNS/ANCILLARY NOTES, Brief Assessment, Lab Results, Radiology Results, Assessment/Plan   Last Updated: 08-Oct-14 17:59 by SLoistine Simas(MD)

## 2014-06-01 NOTE — Discharge Summary (Signed)
PATIENT NAME:  [name], [name] MR#:  [id number] DATE OF BIRTH:  12/19/1950  DATE OF ADMISSION:  11/14/2012 DATE OF DISCHARGE:  11/26/2012  Please refer to interim discharge dictation done by Dr. Allena KatzPatel.  DIAGNOSES AT THE TIME OF DISCHARGE:  1. Hypotension due to diarrhea, dehydration.  2. Severe hypokalemia.  3. Hypomagnesemia.  4. Diarrhea with negative stool studies.  5. Human immunodeficiency virus.  6. Hypophosphatemia.  7. Left leg pain.  8. Gastritis.   HOSPITAL COURSE: This is a 64 year old female with a history of HIV. Currently, the CD4 count is 300, came because she had collapse at home, 2 times and the patient noted to have diarrhea ongoing. Admitted to the hospitalist service because of hyponatremia, hypokalemia and hypomagnesemia with diarrhea seen by GI. Extensive workup done with stool studies and sigmoidoscopy. The patient's sigmoidoscope was negative. Stool studies were negative for cryptosporidia, isospore, C. diff; Giardia also is negative. The patient's HIV RNA count 640 by PCR. Abdominal CAT scan is negative, except, some enteritis versus panniculitis. The patient's potassium on admission 2.2, magnesium 1.3, repeat potassium 4.4. The patient's C. diff has been negative. No Salmonella, no Shigella. The patient was given trial dose of Imodium and that helped her diarrhea, but still had hypotension so ACTH stimulation was done, and the patient was started on Florinef for her hypotension. The patient had normal ACTH levels with stimulation. The patient improved with Florinef and did receive a lot of IV fluids because of hypotension. After starting Florinef bp  started to improve slowly so I did decrease her IV fluids. [facility], that has been arranged  by Dr. Auburn BilberryShreyang  Patel.    TIME SPENT ON DISCHARGE PREPARATION: More than 30 minutes.   ____________________________ Katha HammingSnehalatha Merion Caton, MD sk:sg D: 11/30/2012 08:58:00 ET T: 11/30/2012 10:45:38  ET JOB#: [id number]  cc: Katha HammingSnehalatha Elazar Argabright, MD, <Dictator> Katha HammingSNEHALATHA Orlando Devereux MD ELECTRONICALLY SIGNED 12/10/2012 22:16

## 2014-06-01 NOTE — Consult Note (Signed)
Chief Complaint:  Subjective/Chief Complaint Immediately before proceedure, labs were reviewed again, and noted to be very close to neutrapenia 3 days ago with decline from previous.  As such proceeded with careful sedated flex sig, with intent of bx if grossly abnormal.  Evaluation to 50 cm from the anal verge was normal in appearance.  No biopsies taken.   Anesthesia assistance needed due to h/o multidrung abuse.  Discussed with Dr Hilton SinclairWeiting.   Electronic Signatures: Barnetta ChapelSkulskie, Martin (MD)  (Signed 10-Oct-14 16:21)  Authored: Chief Complaint   Last Updated: 10-Oct-14 16:21 by Barnetta ChapelSkulskie, Martin (MD)

## 2014-06-01 NOTE — Consult Note (Signed)
Chief Complaint:  Subjective/Chief Complaint multiple attempts to obtain records of her colonoscopy unsuccessful, and nurse called to get information, [facility].  When patietn asked about this she had nursing call her formeer boyfriend, who stated she has never had a colonoscopy.  As such will arrange for sedated flex sig versus colonsoscopy tomorrow pm for mucosal biopsies and further evaluation.  I have discussed the risks benefits and complications of these proceedures to incluse not limited to bleedign infection perforation and sedation and she wishes to proceed.   Electronic Signatures: Barnetta ChapelSkulskie, Jayci Ellefson (MD)  (Signed 09-Oct-14 17:08)  Authored: Chief Complaint   Last Updated: 09-Oct-14 17:08 by Barnetta ChapelSkulskie, [name] (MD)

## 2014-06-01 NOTE — Consult Note (Signed)
PATIENT NAME:  [name], Terilyn MR#:  [id number] DATE OF BIRTH:  08/15/1950  DATE OF CONSULTATION:  11/16/2012  REFERRING PHYSICIAN:  Alford Highlandichard Wieting, MD CONSULTING PROVIDER:  Keturah Barrehristiane H. Kismet Facemire, NP  REASON FOR CONSULTATION: Evaluation of diarrhea.   HISTORY OF PRESENT ILLNESS: I appreciate the consult for this 64 year old African American woman with a history of HIV, polysubstance abuse, hepatitis B and C for evaluation of diarrhea. The patient is a difficult historian, states many stools daily that are yellow or brown but otherwise unable to further describe. Reports mid abdominal pain at 8/10 that increases with stress and improves when her "man comes home."   Reports weight loss of about 50 pounds. States she is not eating well. States she drinks too much alcohol. Ex-partner is in the room. States at one time she drank a case of beer every p.m. The patient will not quantify how much she currently drinks of alcohol. Did test positive for cocaine on urine drug screen. States she also dips snuff. No other GI complaints.   So far C. difficile and stool culture negative. Other stool occult studies are pending. Evidently per patient report, has not had EGD or colonoscopy at present. She did have findings of left ovarian 4.2 cm mass on CT scan with recommendations for GYN eval. They report the patient has not had any kind of GYN evaluation for over 6 years. Do note mild elevation of AST and ALT. Normal platelet count. CD count of 300. Viral load of 640.   She is unsure of the meds she takes. Her ex-partner is unsure of the medications that she takes. There may be some ibuprofen, Prilosec, and gabapentin. She does normally [facility] by the name of Dr. Berton MountQuinlivan. Her most recent viral load for her HIV was 640. Has a follow-up appointment next month with this physician.  REVIEW OF SYSTEMS: Ten systems reviewed. Significant for lower back pain. Otherwise  unremarkable.   MEDICATIONS: The patient is not entirely sure; however, could be hydrochlorothiazide 25 mg p.o. daily, Nystatin powder to affected area once a day, ramipril 10 mg p.o. daily, Stribild 1 tab daily. This consists of elvitegravir, tenofovir, Cobicistat, emtricitabine, but they are unsure if this is actually what she is taking.   PAST MEDICAL HISTORY: Chronic back pain, hypertension, HIV, C-section, hepatitis B, hepatitis C.   SOCIAL HISTORY: Endorses the use of tobacco, cigarettes, and dipping snuff daily. Alcohol also. Will not quantify. She will not discuss substances, however did test positive for urine drug screen.   FAMILY HISTORY: Unable to determine at this time.   PHYSICAL EXAMINATION:  VITAL SIGNS: Most recent: Temp 98.3, pulse 83, respiratory rate 22, blood pressure 120/73, SaO2 93%.  GENERAL: Somewhat disheveled middle-aged woman lying in bed, well-nourished appearing, in no acute distress.  HEENT: Normocephalic, atraumatic. No redness, drainage, or inflammation to the eyes or the nares.  NECK: Supple. No masses. Nontender.  LUNGS: Respirations eupneic. Lungs CTAB.  CARDIAC: S1, S2, RRR. No MRG. Peripheral pulses 2+ radially. No significant edema.  ABDOMEN: Somewhat rounded. Bowel sounds x4. No guarding. Nontender. Very soft. No rigidity, rebound, tenderness, peritoneal signs, hepatosplenomegaly or other abnormalities noted.  SKIN: Warm, dry, pink. No erythema, lesion or rash.  MUSCULOSKELETAL: MAEW x4. Strength five out of five. Sensation appears to be intact.  NEUROLOGIC: Alert, oriented x3. Cranial nerves II through XII intact.  PSYCHIATRIC: Rather limited judgment. Poor insight to health care behaviors. Cooperative.   MOST RECENT LABORATORIES: Glucose  98, BUN 2, creatinine 0.88, sodium 137, potassium 2.8, chloride 102, GFR greater than 60, phosphorus 1.5, magnesium 1.5, total protein 7.3, albumin 2.1, total bilirubin 2.1, ALP 55, AST 71, ALT 29. WBC 3.5, hemoglobin  12.5, hematocrit 35.7, platelet count 154. Red cells are normocytic. C. diff negative.   Stool culture negative. Cocaine positive. CD4 300 viral load for HIV 640. Stools for ova parasite: Cryptosporidia, CMV. Isospore and microsporidia are pending.   CT of the abdomen and pelvis with findings of 4.2 cm mass to the left ovary, fatty liver; otherwise, unremarkable.   Chest x-ray demonstrating atelectasis.   IMPRESSION AND PLAN:  1.  Diarrhea stool test pending. Of note, diarrhea is listed as a frequent side effect of Stribild.  Broad differential. Further recommendations to follow.  2.  Abdominal pain, broad differential. Possibly functional. Of note, she is completely nontender on exam. Did discuss possible gastrointestinal effects of her substance abuse.  3.  Elevated AST, likely alcohol related. Recommended cessation.  4.  Left ovarian mass, recommend gynecological evaluation.  5.  Poor historian. Will need to obtain records from [facility] in order to establish baseline medications and help determine further evaluation needs.   These services were provided by Vevelyn Pat, MSN, [facility], in collaboration with Barnetta Chapel, M.D. with whom I have discussed this patient in full.   Thank you very much for this consult.    ____________________________ Keturah Barre, NP chl:np D: 11/16/2012 15:11:17 ET T: 11/16/2012 15:44:35 ET JOB#: [id number]  cc: Keturah Barre, NP, <Dictator> Eustaquio Maize Passion Lavin FNP ELECTRONICALLY SIGNED 11/30/2012 13:58

## 2014-06-01 NOTE — H&P (Signed)
PATIENT NAME:  [name], Aquanetta MR#:  [id number] DATE OF BIRTH:  February 07, 1951  DATE OF ADMISSION:  02/01/2013  Addendum  REVIEW OF SYSTEMS:   CONSTITUTIONAL: Positive fever, fatigue, weakness.  EYES: No blurred or double vision, glaucoma or cataracts.  EAR, NOSE, THROAT: No ear pain, hearing loss. Positive snoring. No seasonal allergies.  RESPIRATORY:  Positive cough. No wheezing. No hemoptysis, dyspnea or painful respirations.  CARDIOVASCULAR: No chest pain, orthopnea, edema, arrhythmia, dyspnea on exertion, palpitations or syncope. GASTROINTESTINAL:  Positive nausea, vomiting and diarrhea. Positive abdominal pain, but no melena or ulcers.  GENITOURINARY: No dysuria or hematuria.  ENDOCRINE: No thyroid problems, increased sweating.  HEMATOLOGIC AND LYMPHATICS:  Positive easy bruising.  SKIN: No rash or lesions.  MUSCULOSKELETAL: Positive pain in the shoulders and back.  NEUROLOGIC:  No history of CVA, TIA or seizure.  PSYCHIATRIC: No history of anxiety or depression.   Tobacco dependence. The patient was counseled for tobacco dependence. She smokes 1 cigarette every week. The patient is not interested in quitting that 1 cigarette. The patient was counseled for 3 minutes.   Upon further review, I will not order a CD4 count, as the patient just recently had a CD4 count as mentioned in October. I will go ahead and order d-dimer. If this d-dimer is normal, it is unlikely that the patient does have a PE and her hypoxia may be driven just by atelectasis, and I have also written for an incentive spirometer.   We do not have the patient's full medications actually.   The ones previously dictated may not be correct. I have asked nursing to obtain the med list.   ____________________________ Stalin Gruenberg P. Juliene PinaMody, MD spm:dmm D: 02/01/2013 22:08:27 ET T: 02/01/2013 22:58:26 ET JOB#: [id number]  cc: Lamone Ferrelli P. Juliene PinaMody, MD, <Dictator> Janyth ContesSITAL P Romaldo Saville MD ELECTRONICALLY SIGNED 02/02/2013 2:11

## 2014-06-01 NOTE — Consult Note (Signed)
Chief Complaint:  Subjective/Chief Complaint seen for diarrhea.  patient states still having diarrhea and abdominal pain, but  both are less than last week.   VITAL SIGNS/ANCILLARY NOTES: **Vital Signs.:   13-Oct-14 14:28  Vital Signs Type Routine  Temperature Temperature (F) 98.1  Celsius 36.7  Temperature Source oral  Pulse Pulse 73  Respirations Respirations 22  Systolic BP Systolic BP 528  Diastolic BP (mmHg) Diastolic BP (mmHg) 75  Mean BP 88  Pulse Ox % Pulse Ox % 100  Pulse Ox Activity Level  At rest  Oxygen Delivery Room Air/ 21 %   Brief Assessment:  Cardiac Regular   Respiratory clear BS   Gastrointestinal details normal Soft  Nondistended  Bowel sounds normal  No rebound tenderness  mild diffuse discomfort to palpatient   Lab Results: Routine Chem:  12-Oct-14 05:10   Glucose, Serum 90  BUN  1  Creatinine (comp) 0.88  Sodium, Serum 137  Potassium, Serum 4.0  Chloride, Serum  108  CO2, Serum 24  Calcium (Total), Serum  7.9  Anion Gap  5  Osmolality (calc) 269  eGFR (African American) >60  eGFR (Non-African American) >60 (eGFR values <64m/min/1.73 m2 may be an indication of chronic kidney disease (CKD). Calculated eGFR is useful in patients with stable renal function. The eGFR calculation will not be reliable in acutely ill patients when serum creatinine is changing rapidly. It is not useful in  patients on dialysis. The eGFR calculation may not be applicable to patients at the low and high extremes of body sizes, pregnant women, and vegetarians.)  Routine Hem:  12-Oct-14 05:10   WBC (CBC) 3.7  RBC (CBC)  3.59  Hemoglobin (CBC)  11.8  Hematocrit (CBC)  34.6  Platelet Count (CBC)  142  MCV 96  MCH 33.0  MCHC 34.2  RDW 14.1  Neutrophil % 27.6  Lymphocyte % 50.4  Monocyte % 18.0  Eosinophil % 3.3  Basophil % 0.7  Neutrophil #  1.0  Lymphocyte # 1.9  Monocyte # 0.7  Eosinophil # 0.1  Basophil # 0.0 (Result(s) reported on 20 Nov 2012 at  05:50AM.)   Radiology Results: [place]    09-Oct-14 14:36, [place] Ultrasound Exam with Transvaginal - NON-OB  [place]vis Ultrasound Exam with Transvaginal - NON-OB   REASON FOR EXAM:    left ovarian mass  COMMENTS:       PROCEDURE: [place] - [place]LVIS EXAM W/TRANSVAGINAL  - Nov 17 2012  2:36PM     RESULT: There is a history of previous hysterectomy. Transabdominal and   endovaginal imaging are performed. The kidneys appear to be grossly   normal. Is a complex area in the posterior midline region posterior to   the bladder measuring 3.79 x 2.53 x 3.5 to centimeters no blood flow is   demonstrated within this structure. The ovaries are not demonstrated. Is   a complex structure with internal echoes of a mosaic a heterogeneous   pattern smooth margins.    IMPRESSION:  Relatively hypoechoic complex mass posterior to the bladder   in the midline as described. Etiology of this is uncertain. Normal     appearing ovaries are not seen. The uterus has been removed. When   compared to the previous CT of 11/14/2012, the calcifications seen   peripherally on the CT is not definitely appreciated. Gynecologic   consultation and followup is recommended.    Dictation Site:2        Verified By: GVernice Jefferson  BROWNE, M.D., MD   Assessment/Plan:  Assessment/Plan:  Assessment 1) diarrhea, in the setting of HIV.   multiple stool labs still pending.  mild improvement?   Plan 1) awaiting labs.  following, continue current.   Electronic Signatures: Loistine Simas (MD)  (Signed 13-Oct-14 15:56)  Authored: Chief Complaint, VITAL SIGNS/ANCILLARY NOTES, Brief Assessment, Lab Results, Radiology Results, Assessment/Plan   Last Updated: 13-Oct-14 15:56 by Loistine Simas (MD)

## 2014-06-01 NOTE — Consult Note (Signed)
Brief Consult Note: Diagnosis: Syncope.   Patient was seen by consultant.   Consult note dictated.   Comments: Appreciate consult for 64 y/o [place] woman with history of HIV, polysubstance abuse, Hepatitis B and C, for evaluation of diarrhea. Patient is difficult historian, states many stools daily that are yellow or brown, but otherwise unable to further describe. Reports mid abdominal pain, 8/10, that increases with stress and improves "when her man comes home". Reports weight loss of about 50lb, states she is not eating well. States she drinks "too much" etoh. Ex-partner in room, states at one time she drank a case of beer qpm- patient will not quantify current etoh intake. Did test positive for cocaine on UDS. Also dips snuff.  No other GI complaints. So far c-diff and stool culture negative. Other stool studies pending. Evidently has not had EGD or colonscopy per report at present. Did have findings of left ovarian 4.2cm mass on CT scan with recommendation for gyn eval. They report no gyn eval in over 7375yr. Do note mild elevation of AST on lfts, normal platelet count. CD4 count 300. Viral load of HIV 640.  Of note, her abdomen is soft, nondistened and absolutely nontender to palpation.  Had 4 stools yesterday, one today. Unsure of the meds she takes. Ex- partner reports some intermittent ibuprofen, prilosec, and gabapentin.   Impression and plan: Diarrhea: stool tests pending. Of note, diarrhea is listed as a freqent side effect of Stribild. Broad differential.                                 Abdominal pain: broad diff, possibly functional- of note, she is completely nontender on exam. Did discuss possible GI effects of her substance abuse.                                Elevated AST: likely etoh related: recommend cessation                                Left Ovarian mass: recommend gyn eval                                Poor historian: [facility] to [facility]  baseline. meds, and help determine further evaluation needs.    [facility], next appt next month.  Electronic Signatures: Vevelyn PatLondon, Emoree Sasaki H (NP)  (Signed 08-Oct-14 15:01)  Authored: Brief Consult Note   Last Updated: 08-Oct-14 15:01 by Keturah BarreLondon, Ajeenah Heiny H (NP)

## 2014-06-01 NOTE — Consult Note (Signed)
PATIENT NAME:  [name], [name] MR#:  [id number] DATE OF BIRTH:  05-22-1950  DATE OF CONSULTATION:  11/18/2012  REQUESTING PHYSICIAN:  Dr. Renae GlossWieting   CONSULTING PHYSICIAN:  Suzy Bouchardhomas J. Schermerhorn, MD  REASON FOR CONSULTATION: Pelvic cyst.   HISTORY OF PRESENT ILLNESS: This is a 64 year old gravida 4, para 2 patient admitted to [facility] on 11/14/2012 for history of syncopal episodes, diarrhea and general malaise. The patient was admitted to the hospital and underwent a CT scan that demonstrated a central pelvic mass measuring approximately 4 cm. The patient then underwent pelvic ultrasound dated 11/17/2012 that showed posterior midline complex cyst measuring 3.7 x 2.5 x 3.5 cm with absent blood flow. The patient is status post abdominal hysterectomy and she is not sure if she had her ovaries removed. This was in her 4540s. The patient is unsure of the indication of surgery. She is an extremely poor historian. The patient was sexually active up to 2 months ago. Denied dyspareunia. The patient states that she has been having intermittent abdominal pain, bilateral for some time. Other findings on CT scan showed cholelithiasis. Mesenteric fat stranding, hepatic steatosis and no free fluid noted in the abdominal or pelvic cavity. The patient's pain is mainly on the left. She has been seen by gastroenterology now twice and work-up thus far is negative. She is scheduled for a flexible sigmoidoscopy tonight by Dr. Marva PandaSkulskie.    PAST MEDICAL HISTORY:  Again, HIV, polysubstance abuse, hepatitis B and C, chronic diarrhea.   FAMILY HISTORY: No gynecologic cancers.   MEDICATIONS:  Hydrochlorothiazide, elvitegravir,  cobicistat, emtricitabine.    ALLERGIES: No allergies.   SOCIAL HISTORY:  The patient engages in recreational drugs and is cocaine positive on admission.   PHYSICAL EXAMINATION: GENERAL: Well-developed, well-nourished black female in no acute distress.  VITAL SIGNS:  107/74,  pulse 84 temperature 98.3.  LUNGS: Clear to auscultation.  CARDIOVASCULAR: Regular rate and rhythm.  ABDOMEN: Soft, slight tenderness to palpation left lower quadrant.  BIMANUAL BEDSIDE PELVIC EXAM: Vagina: No lesions. Cuff slightly tender. No definitive mass palpated. Adnexa: No mass, nontender.  RECTOVAGINAL EXAM: Is deferred.   ASSESSMENT:  1.  Left lower quadrant pain. Most consistent with gastrointestinal etiology.  2.  Midline pelvic mass, complex in nature seen on ultrasound and also verified on CT scan appears to be potentially left ovarian in nature. Differential includes malignancy versus benign pathology.   PLAN: We will have the patient undergo a CA-125 level to look for potential epithelial carcinoma of the ovary although this would be less likely given no other significant findings on CT scan and no free fluid noted on a pelvic ultrasound. If within normal limits, would  recommend the patient be worked up as an outpatient once her GI issues resolve. I did talk to her briefly about diagnostic laparoscopy with possible removal of the mass at the time.     ____________________________ Suzy Bouchardhomas J. Schermerhorn, MD tjs:dp D: 11/18/2012 13:31:00 ET T: 11/18/2012 13:57:44 ET JOB#: [id number]  cc: Suzy Bouchardhomas J. Schermerhorn, MD, <Dictator> Herschell Dimesichard J. Renae GlossWieting, MD  Suzy BouchardHOMAS J SCHERMERHORN MD ELECTRONICALLY SIGNED 11/28/2012 22:10

## 2014-06-01 NOTE — Consult Note (Signed)
Chief Complaint:  Subjective/Chief Complaint seen for diarrhea, denies abdominalpain, n/v or shortness of breath.   VITAL SIGNS/ANCILLARY NOTES: **Vital Signs.:   10-Oct-14 13:55  Vital Signs Type Routine  Temperature Temperature (F) 98.4  Celsius 36.8  Temperature Source oral  Pulse Pulse 73  Respirations Respirations 20  Systolic BP Systolic BP 038  Diastolic BP (mmHg) Diastolic BP (mmHg) 69  Mean BP 81  Pulse Ox % Pulse Ox % 95  Pulse Ox Activity Level  At rest  Oxygen Delivery Room Air/ 21 %   Brief Assessment:  Cardiac Regular   Respiratory clear BS   Gastrointestinal details normal Soft  Nondistended  No masses palpable  Bowel sounds normal  No rebound tenderness  tender to palpation lower abdomen and right abdomen.   Lab Results: General Ref:  09-Oct-14 06:20   CMV Antibodies, IgM Quant ========== TEST NAME ==========  ========= RESULTS =========  = REFERENCE RANGE =  CMV ANTIBODIES, IGM QUAN  Cytomegalovirus (CMV) Ab, IgM Cytomegalovirus (CMV) Ab, IgM   [   <30.0 AU/mL          ]          0.0-29.9                   Negative         <30.0                                                Equivocal  30.0 - 34.9                                                Positive         >34.9                A positive result is generally indicative of acute                infection, reactivation or persistent IgM production.               [facility]            No: [id number]           [address] [place], [place] [address]           Lindon Romp, MD         [id number]   Result(s) reported on 18 Nov 2012 at 06:48AM.  Routine Chem:  10-Oct-14 06:19   Magnesium, Serum  1.4 (1.8-2.4 THERAPEUTIC RANGE: 4-7 mg/dL TOXIC: > 10 mg/dL  -----------------------)  Phosphorus, Serum  1.6 (Result(s) reported on 18 Nov 2012 at 06:58AM.)  Glucose, Serum 94  BUN  1  Creatinine (comp) 0.87  Sodium, Serum 137  Potassium, Serum  3.2  Chloride, Serum 105  CO2,  Serum 26  Calcium (Total), Serum  7.6  Anion Gap  6  Osmolality (calc) 269  eGFR (African American) >60  eGFR (Non-African American) >60 (eGFR values <65m/min/1.73 m2 may be an indication of chronic kidney disease (CKD). Calculated eGFR is useful in patients with stable renal function. The eGFR calculation will not be reliable in acutely ill patients when serum creatinine is changing rapidly. It is not useful in  patients on  dialysis. The eGFR calculation may not be applicable to patients at the low and high extremes of body sizes, pregnant women, and vegetarians.)   Radiology Results: [place]:    09-Oct-14 14:36, US Pelvis Ultrasound Exam with Transvaginal - NON-OB  US Pelvis Ultrasound Exam with Transvaginal - NON-OB   REASON FOR EXAM:    left ovarian mass  COMMENTS:       PROCEDURE: [place]  - US PELVIS EXAM W/TRANSVAGINAL  - Nov 17 2012  2:36PM     RESULT: There is a history of previous hysterectomy. Transabdominal and   endovaginal imaging are performed. The kidneys appear to be grossly   normal. Is a complex area in the posterior midline region posterior to   the bladder measuring 3.79 x 2.53 x 3.5 to centimeters no blood flow is   demonstrated within this structure. The ovaries are not demonstrated. Is   a complex structure with internal echoes of a mosaic a heterogeneous   pattern smooth margins.    IMPRESSION:  Relatively hypoechoic complex mass posterior to the bladder   in the midline as described. Etiology of this is uncertain. Normal     appearing ovaries are not seen. The uterus has been removed. When   compared to the previous CT of 11/14/2012, the calcifications seen   peripherally on the CT is not definitely appreciated. Gynecologic   consultation and followup is recommended.    Dictation Site:2        Verified By: Sundra Aland, M.D., MD  CT:    06-Oct-14 16:39, CT Abdomen and Pelvis Without Contrast  CT Abdomen and Pelvis Without Contrast   REASON FOR  EXAM:    (1) hypotension, dehydration, L sided abdominal pain   , 2 months long diarrhea, w  COMMENTS:       PROCEDURE: CT  - CT ABDOMEN AND PELVIS W0  - Nov 14 2012  4:39PM     RESULT: Indication: Hypotension, dehydration    Comparison: 07/14/2012    Technique: Multiple axial images from the lung bases to the symphysis   pubis were obtained without oral and without intravenous contrast.    Findings:  The lung bases are clear. There is no pleural or pericardial effusions.    No renal, ureteral, or bladder calculi. No obstructive uropathy. No   perinephric stranding is seen. The kidneys are symmetric in size without   evidence for exophytic mass. The bladder is unremarkable.    The liver is diffusely low in attenuation likely secondary to hepatic   steatosis. There are cholelithiasis. The spleen demonstrates no focal   abnormality. The adrenal glands and pancreas are normal. There is a 4.2   cm left ovarian mass of indeterminate etiology.    The unopacified stomach, duodenum, small intestine, and large intestine   are unremarkable, but evaluation is limited by lack of oral contrast.   There is non-specific mesenteric fat stranding. There is no   pneumoperitoneum, pneumatosis, or portal venous gas. There is no     abdominal or pelvic free fluid. There is no lymphadenopathy.     The abdominal aorta is normal in caliber with atherosclerosis. There are   renal collateral vessels present 2    The osseous structures are unremarkable.    IMPRESSION:     1. Nonspecific mesenteric fat stranding as can be seen with vascular   congestion and versus with enteritis versus mesenteric panniculitis.    2. There is a mildly complex cystic left ovarian  mass. Differential   considerations include ovarian neoplasm. Recommend GYN consultation.  3. Cholelithiasis.    4. Hepatic steatosis.    Dictation Site: 1        Verified By: Jennette Banker, M.D., MD   Assessment/Plan:   Assessment/Plan:  Assessment 1) diarrhea-ude.  cultures negative, o+p pending.  2) HIV+, CD4 400 3) polysubstance abuse   Plan 1) flex-sig versus colonoscopy today.  I have discussed the risks benefits and complications of colonoscopy to include not limited top bleeding infection perforation and sdation and she wishes to proceed.   Electronic Signatures: Loistine Simas (MD)  (Signed 10-Oct-14 15:37)  Authored: Chief Complaint, VITAL SIGNS/ANCILLARY NOTES, Brief Assessment, Lab Results, Radiology Results, Assessment/Plan   Last Updated: 10-Oct-14 15:37 by Loistine Simas (MD)

## 2014-06-01 NOTE — Discharge Summary (Signed)
PATIENT NAME:  [name], HYUN MR#:  [id number] DATE OF BIRTH:  1950-04-22  DATE OF ADMISSION:  11/14/2012 DATE OF DISCHARGE:  11/22/2012 (Anticipated transfer)  ADMITTING DIAGNOSES: 1.  Syncope. 2.  Diarrhea.  3.  Hypotension.  4.  Hypokalemia.  5.  Hypomagnesemia.  6.  Severe dehydration.  7.  Leukopenia.  8.  Complex pelvic mass status post GYN evaluation.  9.  Human immunodeficiency virus with good control, CD4 count of 650.  10.  History of polysubstance abuse including cocaine.  11.  History of chronic hepatitis B as well as C.  12.  History of abnormal liver function tests. 13.  Abnormal cortisol level but appropriate ACTH stim test.   PERTINENT LABS AND EVALUATIONS: Admitting glucose was 105, BUN 4, creatinine 0.96, sodium 130, potassium 2.2, chloride 89, CO2 33, calcium 9.1. Magnesium 1.3. LFTs showed a total protein of 7.9, albumin of 2.4, bili total 1.9, alk phos 60, AST 99 and ALT 39. Troponin less than 0.05 x 3. TUDS were positive for cocaine. Most recent potassium is 4.4. CBC showed a WBC of 3.1, hemoglobin 12.8 and platelet count 170. The patient's stool for C. diff was negative. Stool comprehensive showed no Salmonella, no shigellae, no RBCs, no WBCs noted. Stool for Crypto spore: None seen. Stool for Cryptosporidium smear as well as isospore smear: None seen. Micro sporidia: Results are still pending. Giardia is negative. CD4 count is 300. Cortisol baseline 5.9. Subsequent with ACTH stim at 30 minutes was 15.6, 60 minutes was 16.8.  HIV-1 RNA by PCR was 640 copies. CMV IgM was less than 30. CT scan of the abdomen showed nonspecific mesenteric fat stranding as can be seen with vascular congestion versus enteritis versus mesenteric panniculitis. Mildly complex cystic left ovarian mass. Differential included ovarian neoplasm. Cholelithiasis and hepatic steatosis was also noted. Pelvis ultrasound showed relatively hypoechoic complex mass posterior to bladder in the midline. Normal  appearing ovaries are not seen. Bladder in the midline.   CONSULTANTS: Dr. Gustavo Lah.   HOSPITAL COURSE: Please refer to H and P done by the admitting physician. The patient is a 64 year old African American female with history of HIV who is currently on treatment with CD4 count of 300. Was brought into the hospital after she had collapsed with fainting x 2 today.  She was complaining of significant weight loss and also has complained of diarrhea that has been going on. She was admitted for further evaluation and treatment. She was noted to have hyponatremia, severe hypokalemia and hypomagnesemia, which were treated. The patient, because of her diarrhea and hypotension, was seen in consultation by GI. She underwent an extensive work-up with studies. No real source was found. The patient had a sigmoidoscopy. The plan was to do a colonoscopy, but anesthesiology stated that they would not do a colonoscopy in light of the patient having cocaine in her system, would not give her anesthesia. Therefore, a sigmoidoscopy was done and the findings on the sigmoidoscopy were negative. No biopsies were taken. The patient continues to have significant diarrhea despite being on empiric antibiotics with Flagyl and Levaquin. No real source is identified. At this time, we have no ID available here; therefore, she is being transferred to [facility] where she has followed up in the past. Her HIV medication has also been held. The patient also was noted to have a pelvic mass. She was seen by GYN. They recommended CA-125 levels. However, these were never ordered. There is an unclear cause, etiology for this mass. This will  need to be further worked up. Due to the patient also having persistent hypotension when the fluids are discontinued, she had a cortisol level checked which was relatively low in light of her hypotension, but ACTH stimulation was normal. At this time, I have made arrangements for her to be transferred to [facility] due to the  patient not showing much improvement.   MEDICATIONS: At the time of transfer. 1.  Tylenol 650 mg q. 4 p.r.n. 2.  Heparin 5000 sub-Q q. 8. 3.  Ambien 5 mg at bedtime p.r.n. 4.  Zofran 4 mg q. 6 p.r.n.  5.  Mag oxide 400 mg q. 8. 6.  Oxycodone 5 mg q. 4 p.r.n.  7.  KCl 40 mEq q. 8. 8.  Potassium phosphate 1 packet q. 12. 9.  Loperamide 2 mg q.i.d., scheduled.  DISCHARGE DIET:  Low fat.  TIME SPENT: 45 minutes.  ____________________________ [name], MD shp:sb D: 11/22/2012 11:52:00 ET T: 11/22/2012 12:15:01 ET JOB#: [id number]  cc: Said Rueb H. Posey Pronto, MD, <Dictator>  Alric Seton MD ELECTRONICALLY SIGNED 12/03/2012 10:26

## 2014-06-01 NOTE — H&P (Signed)
PATIENT NAME:  [name], FORBIS MR#:  [id number] DATE OF BIRTH:  29-Oct-1950  DATE OF ADMISSION:  02/01/2013  PRIMARY CARE PROVIDER: At [facility].   CHIEF COMPLAINT: "I'm sick since yesterday."  HISTORY OF PRESENT ILLNESS: A 64 year old female with HIV, her last CD4 count was 300 in October presents with above complaint.  The patient says that over the past day she has had body aches, coughing, fevers, abdominal pain, nausea, vomiting and diarrhea. It appears actually in October, she had very similar symptoms. At that time, she was hypotensive and she had stool cultures which were negative. She also was noted to have a fever at home of 101. She was noted here to be hypoxic, with room air sats 88%. Chest x-ray did not show any acute infection, and the CT scan did show a tiny questionable pulmonary embolus with atelectasis.   PAST MEDICAL HISTORY:  1.  The patient was admitted from October 16 through November 26, 2012 for hypotension and diarrhea. She was transferred to [facility] due to persistent hypotension. She had stool cultures including cryptosporidia and C. diff and Giardia which were all negative. At that time, she had HIV RNA, which was 640 by PCR. For hypotension, ACTH stimulation was done. She was on Florinef for hypertension but she had normal ACTH levels with stimulation.  2.  HIV with a CD4 count of 300 as per October 2014.  3.  History of chronic hepatitis B and C.  4.  She says she has a diagnosis of hypertension.   MEDICATIONS:  unknown what the patient is taking she does say she is taking BP meds adn meds for HIV  ALLERGIES: No known drug allergies.   PAST SURGICAL HISTORY: None.  SOCIAL HISTORY: The patient says she smokes 1 cigarette every once in a while. Occasional alcohol use.   FAMILY HISTPRY: UNKNOWN  PHYSICAL EXAMINATION:  VITAL SIGNS: Temperature 99. Pulse is 89. Respirations 25, blood pressure 117/72, 94% on room air.  GENERAL: Alert. She has a very odd affect. Does not appear  to be in acute distress. She is coughing.  HEENT: Head is atraumatic. Pupils are normoactive. Sclerae are anicteric. Mucous membranes are dry.  OROPHARYNX: Clear. There are no exudates.  NECK: Supple without JVD, carotid bruit or enlarged thyroid.  CARDIOVASCULAR: Tachycardia. No murmurs, gallops, or rubs. PMI is not displaced.  LUNGS: Clear to auscultation without crackles, rales, rhonchi or wheezing. Normal percussion.  BACK: No CVA or vertebral tenderness.  ABDOMEN: Bowel sounds are positive.  Nontender, nondistended. She has no hepatosplenomegaly. No guarding or rebound.  EXTREMITIES: No clubbing, cyanosis or edema.  NEUROLOGIC: Cranial nerves II through XII are grossly intact. There are no focal deficits.  SKIN: Without rash or lesions.   HISTORY OF PRESENT ILLNESS: White blood cells 4, hemoglobin 16, hematocrit 48. Platelets are 215. Sodium 134, potassium 2.6, chloride 98, bicarb 29, BUN 7, creatinine 0.93, glucose 73, bilirubin 1.9, alk phos 71, ALT 31, AST 53, and total protein is 9.7. Albumin is 3.2. Urinalysis with 2+ LCE with 2 white blood cells. Influenza A and B are negative.   Chest x-ray shows no acute cardiopulmonary disease.   CT PE protocol shows a single tiny questionable pulmonary embolus versus artifact or sequela of remote emboli in left lower pulmonary artery with scattered atelectasis in bilateral extremities.   Abdominal ultrasound shows gallstones without evidence of cholecystitis.   ASSESSMENT AND PLAN: A 64 year old female, with a history of human immunodeficiency virus with CD4 count in October of  300, who presents initially with nausea, vomiting, diarrhea, abdominal pain, body aches. Negative influenza A and B. Had a full gastrointestinal workup during her last hospitalization with the exception of a colonoscopy, apparently, because she had cocaine in her system,  who is found to have hypoxia and a pulmonary embolism.  1.  Hypoxia with fever. The patient was 88% on  room air. She has what seems to be a probable small pulmonary embolism. This is the only etiology of her hypoxia as her chest x-ray is clear with any evidence of infection. I have spoken to the patient about Xarelto including side effects, alternatives, benefits and risks, major side effects including bleeding, dark-colored stools, vomiting blood. The patient wants to start Xarelto, so we will go ahead and start that. Case management has been consulted for disposition.  Her influenza is negative. BLood cx have been ordered. We can consider repeating a CXR in am. 2.  Abdominal pain, nausea and vomiting. Her abdominal ultrasound showed no evidence of acute cholecystitis. It appear that she has had nausea, vomiting, abdominal pain, diarrhea during her last hospitalization ultimately a negative workup. She never had a colonoscopy. It does not appear to be so. She was seen by gastroenterology at that time. We will go ahead and reorder the stool cultures including Clostridium difficile and, if needed, will consult gastroenterology. She did not have a colonoscopy, but she did have a flexible sigmoidoscopy on October 10 which essentially was normal.  3.  History of human immunodeficiency virus. Will continue her medications once we know what she is taking.  4.  Hypertension. We will continue her outpatient medications once we have figured out what medications she is taking.  5. UTI: I will start Rocephin CODE STATUS: FULL CODE STATUS.   TIME SPENT: Approximately 50 minutes.   ____________________________ Donell Beers. Benjie Karvonen, MD spm:np D: 02/01/2013 21:44:23 ET T: 02/01/2013 22:24:38 ET JOB#: [id number]  cc: Reba Hulett P. Benjie Karvonen, MD, <Dictator> [facility] Eulogio Requena P Euan Wandler MD ELECTRONICALLY SIGNED 02/02/2013 2:10

## 2014-06-02 NOTE — Discharge Summary (Signed)
PATIENT NAME:  [name], [name] MR#:  [id number] DATE OF BIRTH:  Jul 03, 1950  PRIMARY CARE PHYSICIAN:  Nonlocal  DATE OF ADMISSION:  02/01/2013  DATE OF DISCHARGE:  02/12/2013  This is an addendum. For detailed Discharge Summary, please refer to the dictation by Dr. Juliene PinaMody.    CONSULTATIONS:  Dr. Sampson GoonFitzgerald, Dr. Belia HemanKasa.    DISCHARGE DIAGNOSES: 1.  Sepsis, PCP pneumonia.  2.  Acute respiratory failure.  3.  Urinary tract infection.  4.  HIV, with CD4 174.  5.  Chronic hepatitis B and C.  6.  Hypertension.   CODE STATUS: FULL CODE.   CONDITION: Stable.   HOME MEDICATIONS: [facility] physician discharge instruction medication reconciliation list. The patient needs prednisone taper for 14 days. In addition, patient needs Bactrim 2 tablets every 6 hours for 14 days. Also, patient needs continued HAART treatment.   INSTRUCTIONS:  Diet: Low sodium diet. Activity as tolerated. Follow up care: Follow with PCP within 1 to 2 weeks. Follow up with Dr. Sampson GoonFitzgerald within 1 to 2 weeks.   HOSPITAL COURSE: 1.  Sepsis with PCP pneumonia. The patient has been treated with Bactrim, which was started on December 28. In addition, patient was on Levaquin, which was discontinued today. Per Dr. Jarrett AblesFitzgerald's recommendation, We will continue Bactrim for additional 14 days, with total of 21 days. We will discontinue Levaquin before discharge.   2.  For acute respiratory failure, patient was on high flow 2 days ago. Has been on oxygen by nasal cannula for the past 2 days, and today patient has O2 saturation at 91%, without oxygen. The patient has no complaints.   Physical examination is unremarkable. Vital signs are stable. The patient is clinically stable, will be discharged to home today. I discussed the patient's discharge plan with patient and nurse.   TIME SPENT: About 36 minutes.    ____________________________ Shaune PollackQing Talma Aguillard, MD qc:mr D: 02/12/2013 15:24:00 ET T: 02/12/2013 19:06:38  ET JOB#: [id number]  cc: Shaune PollackQing Earle Burson, MD, <Dictator> Shaune PollackQING Clotee Schlicker MD ELECTRONICALLY SIGNED 02/13/2013 16:52

## 2014-06-02 NOTE — Consult Note (Signed)
PATIENT NAME:  [name], MANUKYAN MR#:  [id number] DATE OF BIRTH:  [birth year]  DATE OF CONSULTATION:  02/06/2013  REFERRING PHYSICIAN:  Radene Gunning, MD CONSULTING PHYSICIAN:  Stann Mainland. Sampson Goon, MD  REASON FOR CONSULTATION: HIV and pneumonia.   HISTORY OF PRESENT ILLNESS: This is a 64 year old with known HIV for the last 14 years. She gets her care at [facility] and has been on Stribild. She reports a history of intermittent use and her daughter, who is with her in the room, reports that she occasionally uses alcohol, gets drunk and then decides not to take her meds. She was admitted December 24th with shortness of breath, cough, fevers, abdominal pain, nausea, vomiting and diarrhea. When she came to the Emergency Room, she was noted to be hypoxic with sats 88%. CT scan showed a possible question of pulmonary embolism. This was unclear.   Her last CD4 count in October of 2014 was 300.  Based on this, the patient was admitted, treated with broad-spectrum antibiotics; however, her hypoxia has worsened and she is on high-flow oxygen now. She continues to have cough. Her CD4 has come back and is less than 200. The patient has been seen by palliative care and she has stated she would like intubation if  necessary. She does admit to only take her medications probably 2 times a week. She cannot recall when she last saw Dr. Meredith Staggers at [facility].   PAST MEDICAL HISTORY: 1.  HIV diagnosed approximately 14 years ago and she was also diagnosed with pulmonary TB. She states she has been treated for the TB. She has been following off and on at [facility]. Most recently, she has been on Stribild but cannot how long she has been on it.  2.  Prior TB, status post treatment per the patient.   3.  Polysubstance abuse with cocaine and alcohol and potentially opiates.  4.  Chronic hep B and C.  5.  Hypertension.   MEDICATIONS: Currently, she is on:  1.  Stribild.  2.  Tylenol.  3.  Hydrochlorothiazide.  4.  Zofran.  5.   Pantoprazole.  6.  Ramipril.  7.  Tylenol with hydrocodone.  8.  Cepacol. 9.  Ambien.  10.  Levofloxacin.  11.  Magnesium.  12.  Potassium.  13.  Afrin nasal spray.  14.  Bactrim 2 double-strength tablets every 6 hours.  15.  Lovenox.  16.  Methylprednisolone 80 mg q.8.   SOCIAL HISTORY: The patient currently lives alone but her daughter is involved in her care. She uses alcohol and cocaine.   FAMILY HISTORY:  Noncontributory.   REVIEW OF SYSTEMS: Eleven systems reviewed and negative except as per HPI.  Specifically, she has no headache, dysphagia, thrush, diarrhea, abdominal pain or nausea or vomiting. She denies any rash.    PHYSICAL EXAMINATION: VITAL SIGNS: T-max 98.4. She has had no fevers except for 1 on admission, December 24th, at 101.1. Pulse is 81, blood pressure 92/59, pulse ox 86% on high-flow O2.  HEENT: Pupils equal, round, reactive to light and accommodation. Extraocular movements are intact. Sclerae are anicteric. No pallor. Her oropharynx shows no thrush or oral lesions.  NECK: Supple.  HEART: Regular.  LUNGS: Coarse bilateral breath sounds with some mild wheeze.  ABDOMEN: Soft, nontender, nondistended. No hepatosplenomegaly.  EXTREMITIES: No clubbing, cyanosis or edema.  NEURO:  She is alert and oriented x 3. Grossly nonfocal neuro exam.   LABORATORY, DIAGNOSTIC AND RADIOLOGICAL DATA: Her white count on admission is 4.0, hemoglobin 16.0,  platelets 215. Lipase was negative. Basic metabolic panel was negative except for slightly elevated T-bili at 1.9, AST was 53, albumin was 3.2. Troponin was negative. Blood cultures x 2 were negative. Ultrasound of her abdomen showed multiple small mobile gallstones with fatty infiltration of the liver but no cholecystitis. CD4 count was 172/21%.  Strep pneumonia is negative. Legionella urine antigen negative. Stool culture negative. Repeat CT scan done December 28th shows new extensive interstitial ground-glass opacity throughout the  perihilar regions bilaterally as well as in both lower lobes, lingula and the right middle lobe. Some patchy areas of ground-glass opacity in the upper lobe is slightly more on the right. The previously-noted area of consolidation of the anterior segment of the left lower lobe is less prominent than the previous study. There is a new focus of airspace consolidation in the posterior segment of the lower lobe. There is no appreciable thoracic adenopathy.   IMPRESSION: A 64 year old with human immunodeficiency virus poorly controlled, likely due to noncompliance as well as polysubstance abuse with cocaine and alcohol, presenting with a fever and pneumonia symptoms. She is profoundly hypoxic now. CT scan shows diffuse ground-glass opacities. 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 At this point, I would recommend continuing levofloxacin. Would also continue the Bactrim at high dose which was started yesterday. She will need a 21-day course of this. I have adjusted her steroids to 40 mg IV twice a day and can do a 21-day taper for Pneumocystis carinii pneumonia. I have stopped her vancomycin. We will send sputum induced for Pneumocystis carinii pneumonia if the patient is able to tolerate it. If she is intubated, we could proceed with tracheal aspiration for the procedure.   I will contact [facility] regarding her further history. At this point, I would continue the Stribild but given the history of noncompliance, she is at risk of developing recurrent resistance.   Thank you for the consult. I will be glad to follow with you.   ____________________________ Stann Mainlandavid P. Sampson GoonFitzgerald, MD dpf:cs D: 02/06/2013 20:32:00 ET T: 02/06/2013 20:56:10 ET JOB#: [id number]  cc: Stann Mainlandavid P. Sampson GoonFitzgerald, MD, <Dictator> Abdalla Naramore Sampson GoonFITZGERALD MD ELECTRONICALLY SIGNED 02/09/2013 13:01

## 2015-12-04 ENCOUNTER — Other Ambulatory Visit: Payer: Self-pay | Admitting: Otolaryngology

## 2015-12-04 DIAGNOSIS — M542 Cervicalgia: Secondary | ICD-10-CM

## 2015-12-04 DIAGNOSIS — B20 Human immunodeficiency virus [HIV] disease: Secondary | ICD-10-CM

## 2015-12-10 ENCOUNTER — Ambulatory Visit: Admission: RE | Admit: 2015-12-10 | Payer: Medicaid Other | Source: Ambulatory Visit

## 2015-12-12 ENCOUNTER — Other Ambulatory Visit: Payer: Self-pay | Admitting: Otolaryngology

## 2015-12-12 DIAGNOSIS — R221 Localized swelling, mass and lump, neck: Secondary | ICD-10-CM

## 2015-12-12 DIAGNOSIS — M542 Cervicalgia: Secondary | ICD-10-CM

## 2015-12-20 ENCOUNTER — Ambulatory Visit: Payer: Medicaid Other

## 2016-07-04 ENCOUNTER — Encounter: Payer: Self-pay | Admitting: Emergency Medicine

## 2016-07-04 ENCOUNTER — Inpatient Hospital Stay
Admission: EM | Admit: 2016-07-04 | Discharge: 2016-07-13 | DRG: 974 | Disposition: A | Discharge status: Home / Self Care | Payer: Medicaid Other | Attending: Internal Medicine | Admitting: Internal Medicine

## 2016-07-04 ENCOUNTER — Emergency Department: Payer: Medicaid Other

## 2016-07-04 DIAGNOSIS — J189 Pneumonia, unspecified organism: Secondary | ICD-10-CM

## 2016-07-04 DIAGNOSIS — J440 Chronic obstructive pulmonary disease with acute lower respiratory infection: Secondary | ICD-10-CM | POA: Diagnosis present

## 2016-07-04 DIAGNOSIS — E119 Type 2 diabetes mellitus without complications: Secondary | ICD-10-CM | POA: Diagnosis present

## 2016-07-04 DIAGNOSIS — I10 Essential (primary) hypertension: Secondary | ICD-10-CM | POA: Diagnosis present

## 2016-07-04 DIAGNOSIS — E871 Hypo-osmolality and hyponatremia: Secondary | ICD-10-CM | POA: Diagnosis present

## 2016-07-04 DIAGNOSIS — J209 Acute bronchitis, unspecified: Secondary | ICD-10-CM | POA: Diagnosis present

## 2016-07-04 DIAGNOSIS — E669 Obesity, unspecified: Secondary | ICD-10-CM | POA: Diagnosis present

## 2016-07-04 DIAGNOSIS — Z9119 Patient's noncompliance with other medical treatment and regimen: Secondary | ICD-10-CM | POA: Diagnosis not present

## 2016-07-04 DIAGNOSIS — Z79899 Other long term (current) drug therapy: Secondary | ICD-10-CM | POA: Diagnosis not present

## 2016-07-04 DIAGNOSIS — R0600 Dyspnea, unspecified: Secondary | ICD-10-CM

## 2016-07-04 DIAGNOSIS — Z6841 Body Mass Index (BMI) 40.0 and over, adult: Secondary | ICD-10-CM

## 2016-07-04 DIAGNOSIS — T380X5A Adverse effect of glucocorticoids and synthetic analogues, initial encounter: Secondary | ICD-10-CM | POA: Diagnosis not present

## 2016-07-04 DIAGNOSIS — B59 Pneumocystosis: Principal | ICD-10-CM | POA: Diagnosis present

## 2016-07-04 DIAGNOSIS — F329 Major depressive disorder, single episode, unspecified: Secondary | ICD-10-CM | POA: Diagnosis present

## 2016-07-04 DIAGNOSIS — R0781 Pleurodynia: Secondary | ICD-10-CM

## 2016-07-04 DIAGNOSIS — R410 Disorientation, unspecified: Secondary | ICD-10-CM | POA: Diagnosis not present

## 2016-07-04 DIAGNOSIS — J9601 Acute respiratory failure with hypoxia: Secondary | ICD-10-CM | POA: Diagnosis present

## 2016-07-04 DIAGNOSIS — E876 Hypokalemia: Secondary | ICD-10-CM | POA: Diagnosis present

## 2016-07-04 DIAGNOSIS — R0902 Hypoxemia: Secondary | ICD-10-CM

## 2016-07-04 DIAGNOSIS — B20 Human immunodeficiency virus [HIV] disease: Secondary | ICD-10-CM | POA: Diagnosis present

## 2016-07-04 HISTORY — DX: Essential (primary) hypertension: I10

## 2016-07-04 HISTORY — DX: Human immunodeficiency virus (HIV) disease: B20

## 2016-07-04 LAB — COMPREHENSIVE METABOLIC PANEL
ALBUMIN: 3.3 g/dL — AB (ref 3.5–5.0)
ALT: 61 U/L — AB (ref 14–54)
AST: 117 U/L — AB (ref 15–41)
Alkaline Phosphatase: 72 U/L (ref 38–126)
Anion gap: 10 (ref 5–15)
BUN: 15 mg/dL (ref 6–20)
CHLORIDE: 101 mmol/L (ref 101–111)
CO2: 24 mmol/L (ref 22–32)
CREATININE: 1.26 mg/dL — AB (ref 0.44–1.00)
Calcium: 8.8 mg/dL — ABNORMAL LOW (ref 8.9–10.3)
GFR calc Af Amer: 51 mL/min — ABNORMAL LOW (ref >60)
GFR, EST NON AFRICAN AMERICAN: 44 mL/min — AB (ref >60)
Glucose, Bld: 105 mg/dL — ABNORMAL HIGH (ref 65–99)
POTASSIUM: 3 mmol/L — AB (ref 3.5–5.1)
SODIUM: 135 mmol/L (ref 135–145)
Total Bilirubin: 1.7 mg/dL — ABNORMAL HIGH (ref 0.3–1.2)
Total Protein: 8.7 g/dL — ABNORMAL HIGH (ref 6.5–8.1)

## 2016-07-04 LAB — CBC WITH DIFFERENTIAL/PLATELET
BASOS ABS: 0 10*3/uL (ref 0–0.1)
Basophils Relative: 0 %
EOS PCT: 6 %
Eosinophils Absolute: 0.3 10*3/uL (ref 0–0.7)
HEMATOCRIT: 42.1 % (ref 35.0–47.0)
Hemoglobin: 14.5 g/dL (ref 12.0–16.0)
LYMPHS ABS: 1.3 10*3/uL (ref 1.0–3.6)
LYMPHS PCT: 29 %
MCH: 31.3 pg (ref 26.0–34.0)
MCHC: 34.3 g/dL (ref 32.0–36.0)
MCV: 91.2 fL (ref 80.0–100.0)
MONO ABS: 0.8 10*3/uL (ref 0.2–0.9)
Monocytes Relative: 17 %
NEUTROS ABS: 2.2 10*3/uL (ref 1.4–6.5)
Neutrophils Relative %: 48 %
Platelets: 139 10*3/uL — ABNORMAL LOW (ref 150–440)
RBC: 4.62 MIL/uL (ref 3.80–5.20)
RDW: 15.7 % — ABNORMAL HIGH (ref 11.5–14.5)
WBC: 4.6 10*3/uL (ref 3.6–11.0)

## 2016-07-04 LAB — MAGNESIUM: MAGNESIUM: 1.6 mg/dL — AB (ref 1.7–2.4)

## 2016-07-04 LAB — BRAIN NATRIURETIC PEPTIDE: B NATRIURETIC PEPTIDE 5: 22 pg/mL (ref 0.0–100.0)

## 2016-07-04 LAB — FIBRIN DERIVATIVES D-DIMER (ARMC ONLY): Fibrin derivatives D-dimer (ARMC): 588.45 — ABNORMAL HIGH (ref 0.00–499.00)

## 2016-07-04 MED ORDER — IPRATROPIUM-ALBUTEROL 0.5-2.5 (3) MG/3ML IN SOLN
3.0000 mL | Freq: Once | RESPIRATORY_TRACT | Status: AC
  Administered 2016-07-04: 3 mL via RESPIRATORY_TRACT

## 2016-07-04 MED ORDER — IPRATROPIUM-ALBUTEROL 0.5-2.5 (3) MG/3ML IN SOLN
3.0000 mL | Freq: Once | RESPIRATORY_TRACT | Status: AC
  Administered 2016-07-04: 3 mL via RESPIRATORY_TRACT
  Filled 2016-07-04: qty 3

## 2016-07-04 MED ORDER — HYDROCODONE-ACETAMINOPHEN 5-325 MG PO TABS
1.0000 | ORAL_TABLET | Freq: Four times a day (QID) | ORAL | Status: DC | PRN
  Administered 2016-07-04 – 2016-07-07 (×6): 1 via ORAL
  Filled 2016-07-04 (×6): qty 1

## 2016-07-04 MED ORDER — IOPAMIDOL (ISOVUE-370) INJECTION 76%
75.0000 mL | Freq: Once | INTRAVENOUS | Status: AC | PRN
  Administered 2016-07-04: 75 mL via INTRAVENOUS

## 2016-07-04 MED ORDER — ONDANSETRON HCL 4 MG/2ML IJ SOLN: 4.0000 mg | Freq: Four times a day (QID) | INTRAMUSCULAR | Status: DC | PRN

## 2016-07-04 MED ORDER — IPRATROPIUM-ALBUTEROL 0.5-2.5 (3) MG/3ML IN SOLN
RESPIRATORY_TRACT | Status: AC
  Filled 2016-07-04: qty 3

## 2016-07-04 MED ORDER — RAMIPRIL 10 MG PO CAPS
10.0000 mg | ORAL_CAPSULE | Freq: Every evening | ORAL | Status: DC
  Administered 2016-07-04 – 2016-07-12 (×9): 10 mg via ORAL
  Filled 2016-07-04 (×8): qty 1
  Filled 2016-07-04: qty 5

## 2016-07-04 MED ORDER — AZITHROMYCIN 500 MG PO TABS
500.0000 mg | ORAL_TABLET | Freq: Every day | ORAL | Status: AC
  Administered 2016-07-04: 500 mg via ORAL
  Filled 2016-07-04: qty 1

## 2016-07-04 MED ORDER — AZITHROMYCIN 250 MG PO TABS
250.0000 mg | ORAL_TABLET | Freq: Every day | ORAL | Status: DC
  Administered 2016-07-05 – 2016-07-06 (×2): 250 mg via ORAL
  Filled 2016-07-04 (×2): qty 1

## 2016-07-04 MED ORDER — METHYLPREDNISOLONE SODIUM SUCC 40 MG IJ SOLR
40.0000 mg | Freq: Two times a day (BID) | INTRAMUSCULAR | Status: DC
  Administered 2016-07-04 – 2016-07-08 (×8): 40 mg via INTRAVENOUS
  Filled 2016-07-04 (×8): qty 1

## 2016-07-04 MED ORDER — GUAIFENESIN-DM 100-10 MG/5ML PO SYRP
5.0000 mL | ORAL_SOLUTION | ORAL | Status: DC | PRN
  Administered 2016-07-04 – 2016-07-06 (×3): 5 mL via ORAL
  Filled 2016-07-04 (×3): qty 5

## 2016-07-04 MED ORDER — BUDESONIDE 0.5 MG/2ML IN SUSP
0.5000 mg | Freq: Two times a day (BID) | RESPIRATORY_TRACT | Status: DC
  Administered 2016-07-04 – 2016-07-07 (×6): 0.5 mg via RESPIRATORY_TRACT
  Filled 2016-07-04 (×6): qty 2

## 2016-07-04 MED ORDER — ENOXAPARIN SODIUM 40 MG/0.4ML ~~LOC~~ SOLN
40.0000 mg | SUBCUTANEOUS | Status: DC
  Administered 2016-07-04: 21:00:00 40 mg via SUBCUTANEOUS
  Filled 2016-07-04: qty 0.4

## 2016-07-04 MED ORDER — IPRATROPIUM-ALBUTEROL 0.5-2.5 (3) MG/3ML IN SOLN
3.0000 mL | Freq: Four times a day (QID) | RESPIRATORY_TRACT | Status: DC
  Administered 2016-07-04 – 2016-07-05 (×5): 3 mL via RESPIRATORY_TRACT
  Filled 2016-07-04 (×5): qty 3

## 2016-07-04 MED ORDER — DOLUTEGRAVIR SODIUM 50 MG PO TABS
50.0000 mg | ORAL_TABLET | Freq: Every day | ORAL | Status: DC
  Administered 2016-07-05 – 2016-07-07 (×3): 50 mg via ORAL
  Filled 2016-07-04 (×3): qty 1

## 2016-07-04 MED ORDER — POTASSIUM CHLORIDE CRYS ER 20 MEQ PO TBCR
20.0000 meq | EXTENDED_RELEASE_TABLET | Freq: Two times a day (BID) | ORAL | Status: AC
  Administered 2016-07-04 – 2016-07-06 (×4): 20 meq via ORAL
  Filled 2016-07-04 (×4): qty 1

## 2016-07-04 MED ORDER — ACETAMINOPHEN 325 MG PO TABS
650.0000 mg | ORAL_TABLET | Freq: Four times a day (QID) | ORAL | Status: DC | PRN
  Administered 2016-07-04: 650 mg via ORAL
  Filled 2016-07-04: qty 2

## 2016-07-04 MED ORDER — ONDANSETRON HCL 4 MG PO TABS: 4.0000 mg | ORAL_TABLET | Freq: Four times a day (QID) | ORAL | Status: DC | PRN

## 2016-07-04 MED ORDER — HYDROCHLOROTHIAZIDE 25 MG PO TABS
25.0000 mg | ORAL_TABLET | Freq: Every day | ORAL | Status: DC
  Administered 2016-07-05 – 2016-07-08 (×4): 25 mg via ORAL
  Filled 2016-07-04 (×4): qty 1

## 2016-07-04 MED ORDER — ACETAMINOPHEN 650 MG RE SUPP: 650.0000 mg | Freq: Four times a day (QID) | RECTAL | Status: DC | PRN

## 2016-07-04 MED ORDER — DARUNAVIR-COBICISTAT 800-150 MG PO TABS: 1.0000 | ORAL_TABLET | Freq: Every day | ORAL | Status: DC

## 2016-07-04 MED ORDER — AMITRIPTYLINE HCL 10 MG PO TABS
25.0000 mg | ORAL_TABLET | Freq: Every day | ORAL | Status: DC
  Administered 2016-07-04 – 2016-07-12 (×9): 25 mg via ORAL
  Filled 2016-07-04 (×3): qty 3
  Filled 2016-07-04: qty 2
  Filled 2016-07-04 (×2): qty 3
  Filled 2016-07-04: qty 1
  Filled 2016-07-04 (×3): qty 3

## 2016-07-04 NOTE — ED Provider Notes (Signed)
[facility] Emergency Department Provider Note   ____________________________________________   First MD Initiated Contact with Patient 07/04/16 1336     (approximate)  I have reviewed the triage vital signs and the nursing notes.   HISTORY  Chief Complaint Cough    HPI [name] is a 66 y.o. female who complains of 2 days of increasing cough and shortness of breath and feeling bad. She is congested and stuffy nose. She feels hot and cold and thinks she is running a fever off and on. She has multiple medical problems and infectious diseases including HIV and is taking her medicines as directed. She says her chest aches and hurts especially when she coughs.  } Past Medical History:  Diagnosis Date  . HIV (human immunodeficiency virus infection) (HCC)   . Hypertension     There are no active problems to display for this patient.   History reviewed. No pertinent surgical history.  Prior to Admission medications   Not on File    Allergies Patient has no known allergies.  History reviewed. No pertinent family history.  Social History Social History  Substance Use Topics  . Smoking status: Never Smoker  . Smokeless tobacco: Never Used  . Alcohol use Yes    Review of Systems  Constitutional:  fever/chills Eyes: No visual changes. ENT: No sore throat. Cardiovascular:See history of present illness Respiratory:  shortness of breath. Gastrointestinal: No abdominal pain.  No nausea, no vomiting.  No diarrhea.  No constipation. Genitourinary: Negative for dysuria. Musculoskeletal: Negative for back pain. Skin: Negative for rash. Neurological: Negative for headaches, focal weakness or numbness.   ____________________________________________   PHYSICAL EXAM:  VITAL SIGNS: ED Triage Vitals  Enc Vitals Group     BP 07/04/16 1311 129/82     Pulse Rate 07/04/16 1311 95     Resp 07/04/16 1311 20     Temp 07/04/16 1311 99 F (37.2  C)     Temp Source 07/04/16 1311 Oral     SpO2 07/04/16 1311 92 %     Weight 07/04/16 1312 212 lb (96.2 kg)     Height 07/04/16 1312 5\' 1"  (1.549 m)     Head Circumference --      Peak Flow --      Pain Score 07/04/16 1311 8     Pain Loc --      Pain Edu? --      Excl. in GC? --     Constitutional: Alert and oriented. Patient is coughing and moaning and calling Jesus Eyes: Conjunctivae are normal.  Head: Atraumatic. Nose: No congestion/rhinnorhea. Mouth/Throat: Mucous membranes are moist.  Oropharynx non-erythematous. Neck: No stridor. Cardiovascular: Normal rate, regular rhythm. Grossly normal heart sounds.  Good peripheral circulation. Respiratory: Normal respiratory effort.  No retractions. Lungs Some rhonchi Gastrointestinal: Soft and nontender. No distention. No abdominal bruits. No CVA tenderness. Musculoskeletal: No lower extremity tenderness no pitting edema although her lower legs are puffy.  No joint effusions. Neurologic:  Normal speech and language. No gross focal neurologic deficits are appreciated.  Skin:  Skin is warm, dry and intact. No rash noted.   ____________________________________________   LABS (all labs ordered are listed, but only abnormal results are displayed)  Labs Reviewed  COMPREHENSIVE METABOLIC PANEL - Abnormal; Notable for the following:       Result Value   Potassium 3.0 (*)    Glucose, Bld 105 (*)    Creatinine, Ser 1.26 (*)    Calcium 8.8 (*)  Total Protein 8.7 (*)    Albumin 3.3 (*)    AST 117 (*)    ALT 61 (*)    Total Bilirubin 1.7 (*)    GFR calc non Af Amer 44 (*)    GFR calc Af Amer 51 (*)    All other components within normal limits  CBC WITH DIFFERENTIAL/PLATELET - Abnormal; Notable for the following:    RDW 15.7 (*)    Platelets 139 (*)    All other components within normal limits  FIBRIN DERIVATIVES D-DIMER ([facility] ONLY) - Abnormal; Notable for the following:    Fibrin derivatives D-dimer ([facility]) 588.45 (*)    All  other components within normal limits  BRAIN NATRIURETIC PEPTIDE   ____________________________________________  EKG  EKG read and interpreted by me shows normal sinus rhythm rate of 89 left axis no acute ST-T wave changes ____________________________________________  RADIOLOGY  Study Result   CLINICAL DATA:  Cough and congestion for several days  EXAM: CHEST  2 VIEW  COMPARISON:  02/03/2013  FINDINGS: Cardiac shadow is mildly enlarged. Mild platelike atelectasis is noted in the bases bilaterally. Mild vascular congestion with mild interstitial edema is seen. No sizable effusion or infiltrate is noted. Degenerative changes of the thoracic spine are seen.  IMPRESSION: Mild left basilar atelectasis.  Mild vascular congestion interstitial edema.   Electronically Signed   By: Alcide CleverMark  Lukens M.D.    ____________________________________________   PROCEDURES  Procedure(s) performed: Procedures  Critical Care performed:   ____________________________________________   INITIAL IMPRESSION / ASSESSMENT AND PLAN / ED COURSE  Pertinent labs & imaging results that were available during my care of the patient were reviewed by me and considered in my medical decision making (see chart for details).    patient desaturates to 88 and below when off oxygen. Especially if she exerts herself. I've been unable to find a cause for this however as she is D satting in HIV positive we will admit her.   ____________________________________________   FINAL CLINICAL IMPRESSION(S) / ED DIAGNOSES  Final diagnoses:  Dyspnea  Hypoxia  Dyspnea, unspecified type      NEW MEDICATIONS STARTED DURING THIS VISIT:  New Prescriptions   No medications on file     Note:  This document was prepared using Dragon voice recognition software and may include unintentional dictation errors.    Arnaldo NatalMalinda, Graeson Nouri F, MD 07/04/16 [phone]

## 2016-07-04 NOTE — Progress Notes (Signed)
Patient's home medications are stored in the pharmacy

## 2016-07-04 NOTE — Progress Notes (Signed)
Pt states no SOB but severe pleuritic pain from coughing

## 2016-07-04 NOTE — H&P (Signed)
[employer] -  at [facility]   PATIENT NAME: [name]    MR#:  [id number]  DATE OF BIRTH:  02/14/1950  DATE OF ADMISSION:  07/04/2016  PRIMARY CARE PHYSICIAN: [facility]   REQUESTING/REFERRING PHYSICIAN: Dr. Dorothea Glassman  CHIEF COMPLAINT:   Chief Complaint  Patient presents with  . Cough    HISTORY OF PRESENT ILLNESS:  [name]  is a 66 y.o. female with a known history of HIV, hypertension who presents to the hospital due to cough, shortness of breath ongoing for the past few days. Patient says that she has not been feeling well over the past week or so. She's had a cough which isn't productive of clear sputum associated with some exertional dyspnea. She admits to some chills but no documented fever. She admits to sick contacts with her next-door neighbor having a cold. She says that her HIV medications have recently been changed but she cannot recall why. She denies having any weight loss, hemoptysis, chest pains, abdominal pains, headache dizziness, melena hematochezia or any other associated symptoms. Patient presented to the ER and was noted to be in acute respiratory failure with hypoxia. Hospitalist services were contacted further treatment and evaluation.  PAST MEDICAL HISTORY:   Past Medical History:  Diagnosis Date  . HIV (human immunodeficiency virus infection) (HCC)   . Hypertension     PAST SURGICAL HISTORY:  History reviewed. No pertinent surgical history.  SOCIAL HISTORY:   Social History  Substance Use Topics  . Smoking status: Never Smoker  . Smokeless tobacco: Never Used  . Alcohol use Yes    FAMILY HISTORY:   Family History  Problem Relation Age of Onset  . Adopted: Yes    DRUG ALLERGIES:  No Known Allergies  REVIEW OF SYSTEMS:   Review of Systems  Constitutional: Negative for fever and weight loss.  HENT: Negative for congestion, nosebleeds and tinnitus.   Eyes: Negative for blurred  vision, double vision and redness.  Respiratory: Positive for cough, sputum production and shortness of breath. Negative for hemoptysis.   Cardiovascular: Negative for chest pain, orthopnea, leg swelling and PND.  Gastrointestinal: Negative for abdominal pain, diarrhea, melena, nausea and vomiting.  Genitourinary: Negative for dysuria, hematuria and urgency.  Musculoskeletal: Negative for falls and joint pain.  Neurological: Positive for weakness. Negative for dizziness, tingling, sensory change, focal weakness, seizures and headaches.  Endo/Heme/Allergies: Negative for polydipsia. Does not bruise/bleed easily.  Psychiatric/Behavioral: Negative for depression and memory loss. The patient is not nervous/anxious.     MEDICATIONS AT HOME:   Prior to Admission medications   Not on File      VITAL SIGNS:  Blood pressure (!) 171/87, pulse 100, temperature 99 F (37.2 C), temperature source Oral, resp. rate (!) 30, height 5\' 1"  (1.549 m), weight 96.2 kg (212 lb), SpO2 94 %.  PHYSICAL EXAMINATION:  Physical Exam  GENERAL:  66 y.o.-year-old patient lying in bed in mild Resp. Distress.  EYES: Pupils equal, round, reactive to light and accommodation. No scleral icterus. Extraocular muscles intact.  HEENT: Head atraumatic, normocephalic. Oropharynx and nasopharynx clear. No oropharyngeal erythema, moist oral mucosa  NECK:  Supple, no jugular venous distention. No thyroid enlargement, no tenderness.  LUNGS: Prolonged inspiratory and expiratory phase, end expiratory wheezing and rhonchi bilaterally. No use of accessory muscles of respiration.  CARDIOVASCULAR: S1, S2 RRR. No murmurs, rubs, gallops, clicks.  ABDOMEN: Soft, nontender, nondistended. Bowel sounds present. No organomegaly or mass.  EXTREMITIES: No pedal edema,  cyanosis, or clubbing. + 2 pedal & radial pulses b/l.   NEUROLOGIC: Cranial nerves II through XII are intact. No focal Motor or sensory deficits appreciated b/l PSYCHIATRIC: The  patient is alert and oriented x 3. SKIN: No obvious rash, lesion, or ulcer.   LABORATORY PANEL:   CBC  Recent Labs Lab 07/04/16 1413  WBC 4.6  HGB 14.5  HCT 42.1  PLT 139*   ------------------------------------------------------------------------------------------------------------------  Chemistries   Recent Labs Lab 07/04/16 1413  NA 135  K 3.0*  CL 101  CO2 24  GLUCOSE 105*  BUN 15  CREATININE 1.26*  CALCIUM 8.8*  AST 117*  ALT 61*  ALKPHOS 72  BILITOT 1.7*   ------------------------------------------------------------------------------------------------------------------  Cardiac Enzymes No results for input(s): TROPONINI in the last 168 hours. ------------------------------------------------------------------------------------------------------------------  RADIOLOGY:  Dg Chest 2 View  Result Date: 07/04/2016 CLINICAL DATA:  Cough and congestion for several days EXAM: CHEST  2 VIEW COMPARISON:  02/03/2013 FINDINGS: Cardiac shadow is mildly enlarged. Mild platelike atelectasis is noted in the bases bilaterally. Mild vascular congestion with mild interstitial edema is seen. No sizable effusion or infiltrate is noted. Degenerative changes of the thoracic spine are seen. IMPRESSION: Mild left basilar atelectasis. Mild vascular congestion interstitial edema. Electronically Signed   By: Alcide CleverMark  Lukens M.D.   On: 07/04/2016 14:08   Ct Angio Chest Pe W Or Wo Contrast  Result Date: 07/04/2016 CLINICAL DATA:  Cough and congestion for several days EXAM: CT ANGIOGRAPHY CHEST WITH CONTRAST TECHNIQUE: Multidetector CT imaging of the chest was performed using the standard protocol during bolus administration of intravenous contrast. Multiplanar CT image reconstructions and MIPs were obtained to evaluate the vascular anatomy. CONTRAST:  75 mL Isovue 370. COMPARISON:  Plain film from earlier in the same day. FINDINGS: Cardiovascular: Atherosclerotic calcifications of the aorta are  noted without aneurysmal dilatation or dissection. Pulmonary artery demonstrates a normal branching pattern. No filling defects to suggest pulmonary emboli are identified. No significant cardiac enlargement is seen. No coronary calcifications are noted. Mediastinum/Nodes: No significant hilar or mediastinal adenopathy is noted. The thoracic inlet is within normal limits. Lungs/Pleura: Lungs are well aerated bilaterally. No focal infiltrate or sizable effusion is seen. Platelike atelectasis is noted in the left lower lobe similar to that seen on recent plain film examination. No sizable effusion or pneumothorax is noted. Upper Abdomen: Mild fatty infiltration of the liver is seen. The remainder the upper abdomen is within normal limits. Musculoskeletal: Degenerative changes of the thoracic spine are noted. No acute bony abnormality is seen. Review of the MIP images confirms the above findings. IMPRESSION: No evidence of pulmonary emboli. Mild left lower lobe atelectasis similar to that seen on prior plain film examination. No focal confluent infiltrate is seen. Electronically Signed   By: Alcide CleverMark  Lukens M.D.   On: 07/04/2016 15:47     IMPRESSION AND PLAN:   66 year old female with past medical history of HIV, hypertension who presents to the hospital due to shortness of breath and cough and noted to be in acute respiratory failure with hypoxemia.   1. Acute respiratory failure with hypoxia-etiology unclear presently but suspected be secondary to acute bronchitis/pneumonia. -CT chest negative for pulmonary embolism but showing some left lower lobe atelectasis. -We'll continue O2 supplementation and treat patient for underlying bronchitis with steroids, DuoNeb's Pulmicort nebs and a Z-Pak.  2. Acute bronchitis-suspected source of patient's hypoxia. We'll treat the patient with IV steroids, scheduled DuoNeb's, Pulmicort nebs, Z-Pak.  3. History of HIV-we will check viral load,  CD4 count. - cont. HAART.    4. Hypokalemia - will place on oral Potasium supplements and repeat level in a.m.  - check Mg. Level.   5. Abnormal LFT's - etiology unclear.  Follow LFT's.  - check hepatitis profile.   All the records are reviewed and case discussed with ED provider. Management plans discussed with the patient, family and they are in agreement.  CODE STATUS: Full code  TOTAL TIME TAKING CARE OF THIS PATIENT: 45 minutes.    Houston Siren M.D on 07/04/2016 at 4:19 PM  Between 7am to 6pm - Pager - [phone]  After 6pm go to [email or web address] - password [email or web address] [facility]  [facility]  Office  [phone]  CC: Primary care physician; [facility]

## 2016-07-04 NOTE — ED Notes (Signed)
Patient's sats dropped to 85% on room air when ambulating in the room off of O2,

## 2016-07-04 NOTE — ED Triage Notes (Signed)
Pt to ed with c/o cough, congestion x 3 days. Pt with congested cough noted at triage sats 92% on RA.  Pt reports increased sob with ambulation or activity over the past few days. HX of HIV

## 2016-07-05 LAB — CBC
HEMATOCRIT: 41.5 % (ref 35.0–47.0)
HEMOGLOBIN: 14.2 g/dL (ref 12.0–16.0)
MCH: 31.8 pg (ref 26.0–34.0)
MCHC: 34.1 g/dL (ref 32.0–36.0)
MCV: 93.1 fL (ref 80.0–100.0)
Platelets: 124 10*3/uL — ABNORMAL LOW (ref 150–440)
RBC: 4.46 MIL/uL (ref 3.80–5.20)
RDW: 15.7 % — ABNORMAL HIGH (ref 11.5–14.5)
WBC: 3 10*3/uL — ABNORMAL LOW (ref 3.6–11.0)

## 2016-07-05 LAB — COMPREHENSIVE METABOLIC PANEL
ALBUMIN: 3.2 g/dL — AB (ref 3.5–5.0)
ALT: 58 U/L — ABNORMAL HIGH (ref 14–54)
ANION GAP: 12 (ref 5–15)
AST: 120 U/L — ABNORMAL HIGH (ref 15–41)
Alkaline Phosphatase: 60 U/L (ref 38–126)
BUN: 20 mg/dL (ref 6–20)
CO2: 23 mmol/L (ref 22–32)
Calcium: 8.8 mg/dL — ABNORMAL LOW (ref 8.9–10.3)
Chloride: 100 mmol/L — ABNORMAL LOW (ref 101–111)
Creatinine, Ser: 1.48 mg/dL — ABNORMAL HIGH (ref 0.44–1.00)
GFR calc Af Amer: 42 mL/min — ABNORMAL LOW (ref >60)
GFR calc non Af Amer: 37 mL/min — ABNORMAL LOW (ref >60)
GLUCOSE: 284 mg/dL — AB (ref 65–99)
POTASSIUM: 3.2 mmol/L — AB (ref 3.5–5.1)
SODIUM: 135 mmol/L (ref 135–145)
Total Bilirubin: 1.1 mg/dL (ref 0.3–1.2)
Total Protein: 8.6 g/dL — ABNORMAL HIGH (ref 6.5–8.1)

## 2016-07-05 MED ORDER — ENOXAPARIN SODIUM 40 MG/0.4ML ~~LOC~~ SOLN
40.0000 mg | Freq: Two times a day (BID) | SUBCUTANEOUS | Status: DC
  Administered 2016-07-05 – 2016-07-13 (×16): 40 mg via SUBCUTANEOUS
  Filled 2016-07-05 (×16): qty 0.4

## 2016-07-05 MED ORDER — NYSTATIN 100000 UNIT/ML MT SUSP
5.0000 mL | Freq: Four times a day (QID) | OROMUCOSAL | Status: DC
  Administered 2016-07-05 – 2016-07-13 (×30): 500000 [IU] via ORAL
  Filled 2016-07-05 (×31): qty 5

## 2016-07-05 MED ORDER — MAGNESIUM OXIDE 400 (241.3 MG) MG PO TABS
800.0000 mg | ORAL_TABLET | ORAL | Status: AC
  Administered 2016-07-05 (×2): 800 mg via ORAL
  Filled 2016-07-05 (×2): qty 2

## 2016-07-05 NOTE — Progress Notes (Signed)
Anticoagulation monitoring(Lovenox):  65yo  F ordered Lovenox 40 mg Q24h  Filed Weights   07/04/16 1312  Weight: 212 lb (96.2 kg)   BMI 40.1   Lab Results  Component Value Date   CREATININE 1.48 (H) 07/05/2016   CREATININE 1.26 (H) 07/04/2016   CREATININE 0.86 02/10/2013   Estimated Creatinine Clearance: 41.3 mL/min (A) (by C-G formula based on SCr of 1.48 mg/dL (H)). Hemoglobin & Hematocrit     Component Value Date/Time   HGB 14.2 07/05/2016 0430   HGB 14.4 02/05/2013 0608   HCT 41.5 07/05/2016 0430   HCT 42.7 02/05/2013 0608     Per Protocol for Patient with estCrcl > 30 ml/min and BMI > 40, will transition to Lovenox 40 mg Q12h.      Bari MantisKristin Zemirah Krasinski PharmD Clinical Pharmacist 07/05/2016

## 2016-07-05 NOTE — Progress Notes (Signed)
O2 sat 89 on 2 L. Increased to 3 L with 92% sat.

## 2016-07-05 NOTE — Progress Notes (Signed)
[facility] - [place] at [facility]   PATIENT NAME: [name]    MR#:  [id number]  DATE OF BIRTH:  [birth year]  SUBJECTIVE:  CHIEF COMPLAINT:   Chief Complaint  Patient presents with  . Cough   Still has cough, congestion and wheezing with SOB. Afebrile. Weak  REVIEW OF SYSTEMS:    Review of Systems  Constitutional: Positive for malaise/fatigue. Negative for chills and fever.  HENT: Negative for sore throat.   Eyes: Negative for blurred vision, double vision and pain.  Respiratory: Positive for cough, shortness of breath and wheezing. Negative for hemoptysis.   Cardiovascular: Negative for chest pain, palpitations, orthopnea and leg swelling.  Gastrointestinal: Negative for abdominal pain, constipation, diarrhea, heartburn, nausea and vomiting.  Genitourinary: Negative for dysuria and hematuria.  Musculoskeletal: Negative for back pain and joint pain.  Skin: Negative for rash.  Neurological: Positive for weakness. Negative for sensory change, speech change, focal weakness and headaches.  Endo/Heme/Allergies: Does not bruise/bleed easily.  Psychiatric/Behavioral: Negative for depression. The patient is not nervous/anxious.     DRUG ALLERGIES:  No Known Allergies  VITALS:  Blood pressure (!) 101/46, pulse 88, temperature 98 F (36.7 C), temperature source Oral, resp. rate 20, height 5\' 1"  (1.549 m), weight 96.2 kg (212 lb), SpO2 95 %.  PHYSICAL EXAMINATION:   Physical Exam  GENERAL:  66 y.o.-year-old patient lying in the bed with no acute distress.  EYES: Pupils equal, round, reactive to light and accommodation. No scleral icterus. Extraocular muscles intact.  HEENT: Head atraumatic, normocephalic. Oropharynx and nasopharynx clear.  NECK:  Supple, no jugular venous distention. No thyroid enlargement, no tenderness.  LUNGS: Bilateral wheezing and poor air entry CARDIOVASCULAR: S1, S2 normal. No murmurs, rubs, or gallops.  ABDOMEN: Soft, nontender,  nondistended. Bowel sounds present. No organomegaly or mass.  EXTREMITIES: No cyanosis, clubbing or edema b/l.    NEUROLOGIC: Cranial nerves II through XII are intact. No focal Motor or sensory deficits b/l.   PSYCHIATRIC: The patient is alert and oriented x 3.  SKIN: No obvious rash, lesion, or ulcer.   LABORATORY PANEL:   CBC  Recent Labs Lab 07/05/16 0430  WBC 3.0*  HGB 14.2  HCT 41.5  PLT 124*   ------------------------------------------------------------------------------------------------------------------ Chemistries   Recent Labs Lab 07/04/16 1413 07/05/16 0430  NA 135 135  K 3.0* 3.2*  CL 101 100*  CO2 24 23  GLUCOSE 105* 284*  BUN 15 20  CREATININE 1.26* 1.48*  CALCIUM 8.8* 8.8*  MG 1.6*  --   AST 117* 120*  ALT 61* 58*  ALKPHOS 72 60  BILITOT 1.7* 1.1   ------------------------------------------------------------------------------------------------------------------  Cardiac Enzymes No results for input(s): TROPONINI in the last 168 hours. ------------------------------------------------------------------------------------------------------------------  RADIOLOGY:  Dg Chest 2 View  Result Date: 07/04/2016 CLINICAL DATA:  Cough and congestion for several days EXAM: CHEST  2 VIEW COMPARISON:  02/03/2013 FINDINGS: Cardiac shadow is mildly enlarged. Mild platelike atelectasis is noted in the bases bilaterally. Mild vascular congestion with mild interstitial edema is seen. No sizable effusion or infiltrate is noted. Degenerative changes of the thoracic spine are seen. IMPRESSION: Mild left basilar atelectasis. Mild vascular congestion interstitial edema. Electronically Signed   By: Alcide Clever M.D.   On: 07/04/2016 14:08   Ct Angio Chest Pe W Or Wo Contrast  Result Date: 07/04/2016 CLINICAL DATA:  Cough and congestion for several days EXAM: CT ANGIOGRAPHY CHEST WITH CONTRAST TECHNIQUE: Multidetector CT imaging of the chest was performed using the standard  protocol  during bolus administration of intravenous contrast. Multiplanar CT image reconstructions and MIPs were obtained to evaluate the vascular anatomy. CONTRAST:  75 mL Isovue 370. COMPARISON:  Plain film from earlier in the same day. FINDINGS: Cardiovascular: Atherosclerotic calcifications of the aorta are noted without aneurysmal dilatation or dissection. Pulmonary artery demonstrates a normal branching pattern. No filling defects to suggest pulmonary emboli are identified. No significant cardiac enlargement is seen. No coronary calcifications are noted. Mediastinum/Nodes: No significant hilar or mediastinal adenopathy is noted. The thoracic inlet is within normal limits. Lungs/Pleura: Lungs are well aerated bilaterally. No focal infiltrate or sizable effusion is seen. Platelike atelectasis is noted in the left lower lobe similar to that seen on recent plain film examination. No sizable effusion or pneumothorax is noted. Upper Abdomen: Mild fatty infiltration of the liver is seen. The remainder the upper abdomen is within normal limits. Musculoskeletal: Degenerative changes of the thoracic spine are noted. No acute bony abnormality is seen. Review of the MIP images confirms the above findings. IMPRESSION: No evidence of pulmonary emboli. Mild left lower lobe atelectasis similar to that seen on prior plain film examination. No focal confluent infiltrate is seen. Electronically Signed   By: Alcide CleverMark  Lukens M.D.   On: 07/04/2016 15:47     ASSESSMENT AND PLAN:   66 year old female with past medical history of HIV, hypertension who presents to the hospital due to shortness of breath and cough and noted to be in acute respiratory failure with hypoxemia.   * Acute respiratory failure with hypoxia due to acute bronchitis -CT chest negative for pulmonary embolism but showing some left lower lobe atelectasis. -IV steroids, Antibiotics - Scheduled Nebulizers - Inhalers -Wean O2 as tolerated - Consult  pulmonary if no improvement  * History of HIV-we will check viral load, CD4 count. - cont. HAART.   * Hypokalemia  Replace orally  * Abnormal LFT's , mild Follow labs  All the records are reviewed and case discussed with Care Management/Social Workerr. Management plans discussed with the patient, family and they are in agreement.  CODE STATUS: FULL CODE  DVT Prophylaxis: SCDs  TOTAL TIME TAKING CARE OF THIS PATIENT: 30 minutes.   POSSIBLE D/C IN 2-3 DAYS, DEPENDING ON CLINICAL CONDITION.  Milagros LollSudini, Lessa Huge R M.D on 07/05/2016 at 9:45 AM  Between 7am to 6pm - Pager - [phone]  After 6pm go to [email or web address] - password [email or web address]  [facility]  Office  [phone]  CC: Primary care physician; [facility]  Note: This dictation was prepared with Dragon dictation along with smaller phrase technology. Any transcriptional errors that result from this process are unintentional.

## 2016-07-06 LAB — CBC WITH DIFFERENTIAL/PLATELET
BASOS PCT: 0 %
Basophils Absolute: 0 10*3/uL (ref 0–0.1)
EOS ABS: 0 10*3/uL (ref 0–0.7)
Eosinophils Relative: 0 %
HCT: 40 % (ref 35.0–47.0)
Hemoglobin: 13.5 g/dL (ref 12.0–16.0)
Lymphocytes Relative: 7 %
Lymphs Abs: 0.7 10*3/uL — ABNORMAL LOW (ref 1.0–3.6)
MCH: 31.6 pg (ref 26.0–34.0)
MCHC: 33.7 g/dL (ref 32.0–36.0)
MCV: 93.6 fL (ref 80.0–100.0)
MONO ABS: 0.8 10*3/uL (ref 0.2–0.9)
MONOS PCT: 8 %
Neutro Abs: 9.1 10*3/uL — ABNORMAL HIGH (ref 1.4–6.5)
Neutrophils Relative %: 85 %
Platelets: 130 10*3/uL — ABNORMAL LOW (ref 150–440)
RBC: 4.28 MIL/uL (ref 3.80–5.20)
RDW: 15.5 % — AB (ref 11.5–14.5)
WBC: 10.6 10*3/uL (ref 3.6–11.0)

## 2016-07-06 LAB — HELPER T-LYMPH-CD4 (ARMC ONLY)
% CD 4 POS. LYMPH.: 12.4 % — AB (ref 30.8–58.5)
Absolute CD 4 Helper: 87 /uL — ABNORMAL LOW (ref 359–1519)
Basophils Absolute: 0 10*3/uL (ref 0.0–0.2)
Basos: 0 %
EOS (ABSOLUTE): 0 10*3/uL (ref 0.0–0.4)
Eos: 0 %
Hematocrit: 41.4 % (ref 34.0–46.6)
Hemoglobin: 13.8 g/dL (ref 11.1–15.9)
IMMATURE GRANS (ABS): 0 10*3/uL (ref 0.0–0.1)
IMMATURE GRANULOCYTES: 0 %
LYMPHS: 24 %
Lymphocytes Absolute: 0.7 10*3/uL (ref 0.7–3.1)
MCH: 31.1 pg (ref 26.6–33.0)
MCHC: 33.3 g/dL (ref 31.5–35.7)
MCV: 93 fL (ref 79–97)
Monocytes Absolute: 0.1 10*3/uL (ref 0.1–0.9)
Monocytes: 4 %
NEUTROS PCT: 72 %
Neutrophils Absolute: 2.1 10*3/uL (ref 1.4–7.0)
Platelets: 122 10*3/uL — ABNORMAL LOW (ref 150–379)
RBC: 4.44 x10E6/uL (ref 3.77–5.28)
RDW: 15.7 % — AB (ref 12.3–15.4)
WBC: 3 10*3/uL — ABNORMAL LOW (ref 3.4–10.8)

## 2016-07-06 LAB — COMPREHENSIVE METABOLIC PANEL
ALT: 46 U/L (ref 14–54)
ANION GAP: 7 (ref 5–15)
AST: 84 U/L — ABNORMAL HIGH (ref 15–41)
Albumin: 3 g/dL — ABNORMAL LOW (ref 3.5–5.0)
Alkaline Phosphatase: 57 U/L (ref 38–126)
BILIRUBIN TOTAL: 1 mg/dL (ref 0.3–1.2)
BUN: 25 mg/dL — ABNORMAL HIGH (ref 6–20)
CO2: 27 mmol/L (ref 22–32)
Calcium: 9.4 mg/dL (ref 8.9–10.3)
Chloride: 98 mmol/L — ABNORMAL LOW (ref 101–111)
Creatinine, Ser: 1.32 mg/dL — ABNORMAL HIGH (ref 0.44–1.00)
GFR calc Af Amer: 49 mL/min — ABNORMAL LOW (ref >60)
GFR calc non Af Amer: 42 mL/min — ABNORMAL LOW (ref >60)
GLUCOSE: 352 mg/dL — AB (ref 65–99)
POTASSIUM: 3.7 mmol/L (ref 3.5–5.1)
SODIUM: 132 mmol/L — AB (ref 135–145)
TOTAL PROTEIN: 7.9 g/dL (ref 6.5–8.1)

## 2016-07-06 LAB — HEPATITIS PANEL, ACUTE
HCV AB: 0.6 {s_co_ratio} (ref 0.0–0.9)
HEP B S AG: NEGATIVE
Hep A IgM: NEGATIVE
Hep B C IgM: NEGATIVE

## 2016-07-06 LAB — HIV-1 RNA QUANT-NO REFLEX-BLD
HIV 1 RNA Quant: 200 copies/mL
LOG10 HIV-1 RNA: 2.301 {Log_copies}/mL

## 2016-07-06 MED ORDER — IPRATROPIUM-ALBUTEROL 0.5-2.5 (3) MG/3ML IN SOLN: 3.0000 mL | Freq: Four times a day (QID) | RESPIRATORY_TRACT | Status: DC | PRN

## 2016-07-06 MED ORDER — IPRATROPIUM-ALBUTEROL 0.5-2.5 (3) MG/3ML IN SOLN
3.0000 mL | Freq: Three times a day (TID) | RESPIRATORY_TRACT | Status: DC
  Administered 2016-07-06 – 2016-07-13 (×22): 3 mL via RESPIRATORY_TRACT
  Filled 2016-07-06 (×22): qty 3

## 2016-07-06 NOTE — Progress Notes (Signed)
[facility] - [place] at [facility]   PATIENT NAME: [name]    MR#:  [id number]  DATE OF BIRTH:  1950-09-07  SUBJECTIVE:  CHIEF COMPLAINT:   Chief Complaint  Patient presents with  . Cough   Still has cough, shortness of breath and wheezing. Pain in right lower chest with coughing. Afebrile. On 3 L oxygen.  Significant shortness of breath on minimal movement.  REVIEW OF SYSTEMS:    Review of Systems  Constitutional: Positive for malaise/fatigue. Negative for chills and fever.  HENT: Negative for sore throat.   Eyes: Negative for blurred vision, double vision and pain.  Respiratory: Positive for cough, shortness of breath and wheezing. Negative for hemoptysis.   Cardiovascular: Negative for chest pain, palpitations, orthopnea and leg swelling.  Gastrointestinal: Negative for abdominal pain, constipation, diarrhea, heartburn, nausea and vomiting.  Genitourinary: Negative for dysuria and hematuria.  Musculoskeletal: Negative for back pain and joint pain.  Skin: Negative for rash.  Neurological: Positive for weakness. Negative for sensory change, speech change, focal weakness and headaches.  Endo/Heme/Allergies: Does not bruise/bleed easily.  Psychiatric/Behavioral: Negative for depression. The patient is not nervous/anxious.     DRUG ALLERGIES:  No Known Allergies  VITALS:  Blood pressure (!) 111/59, pulse 93, temperature 97.6 F (36.4 C), temperature source Oral, resp. rate (!) 22, height 5\' 1"  (1.549 m), weight 96.2 kg (212 lb), SpO2 90 %.  PHYSICAL EXAMINATION:   Physical Exam  GENERAL:  66 y.o.-year-old patient lying in the bed with no acute distress.  EYES: Pupils equal, round, reactive to light and accommodation. No scleral icterus. Extraocular muscles intact.  HEENT: Head atraumatic, normocephalic. Oropharynx and nasopharynx clear.  NECK:  Supple, no jugular venous distention. No thyroid enlargement, no tenderness.  LUNGS: Bilateral  wheezing and poor air entry CARDIOVASCULAR: S1, S2 normal. No murmurs, rubs, or gallops.  ABDOMEN: Soft, nontender, nondistended. Bowel sounds present. No organomegaly or mass.  EXTREMITIES: No cyanosis, clubbing or edema b/l.    NEUROLOGIC: Cranial nerves II through XII are intact. No focal Motor or sensory deficits b/l.   PSYCHIATRIC: The patient is alert and oriented x 3.  SKIN: No obvious rash, lesion, or ulcer.   LABORATORY PANEL:   CBC  Recent Labs Lab 07/06/16 0454  WBC 10.6  HGB 13.5  HCT 40.0  PLT 130*   ------------------------------------------------------------------------------------------------------------------ Chemistries   Recent Labs Lab 07/04/16 1413  07/06/16 0454  NA 135  < > 132*  K 3.0*  < > 3.7  CL 101  < > 98*  CO2 24  < > 27  GLUCOSE 105*  < > 352*  BUN 15  < > 25*  CREATININE 1.26*  < > 1.32*  CALCIUM 8.8*  < > 9.4  MG 1.6*  --   --   AST 117*  < > 84*  ALT 61*  < > 46  ALKPHOS 72  < > 57  BILITOT 1.7*  < > 1.0  < > = values in this interval not displayed. ------------------------------------------------------------------------------------------------------------------  Cardiac Enzymes No results for input(s): TROPONINI in the last 168 hours. ------------------------------------------------------------------------------------------------------------------  RADIOLOGY:  Dg Chest 2 View  Result Date: 07/04/2016 CLINICAL DATA:  Cough and congestion for several days EXAM: CHEST  2 VIEW COMPARISON:  02/03/2013 FINDINGS: Cardiac shadow is mildly enlarged. Mild platelike atelectasis is noted in the bases bilaterally. Mild vascular congestion with mild interstitial edema is seen. No sizable effusion or infiltrate is noted. Degenerative changes of the thoracic spine are seen.  IMPRESSION: Mild left basilar atelectasis. Mild vascular congestion interstitial edema. Electronically Signed   By: Alcide CleverMark  Lukens M.D.   On: 07/04/2016 14:08   Ct Angio Chest  Pe W Or Wo Contrast  Result Date: 07/04/2016 CLINICAL DATA:  Cough and congestion for several days EXAM: CT ANGIOGRAPHY CHEST WITH CONTRAST TECHNIQUE: Multidetector CT imaging of the chest was performed using the standard protocol during bolus administration of intravenous contrast. Multiplanar CT image reconstructions and MIPs were obtained to evaluate the vascular anatomy. CONTRAST:  75 mL Isovue 370. COMPARISON:  Plain film from earlier in the same day. FINDINGS: Cardiovascular: Atherosclerotic calcifications of the aorta are noted without aneurysmal dilatation or dissection. Pulmonary artery demonstrates a normal branching pattern. No filling defects to suggest pulmonary emboli are identified. No significant cardiac enlargement is seen. No coronary calcifications are noted. Mediastinum/Nodes: No significant hilar or mediastinal adenopathy is noted. The thoracic inlet is within normal limits. Lungs/Pleura: Lungs are well aerated bilaterally. No focal infiltrate or sizable effusion is seen. Platelike atelectasis is noted in the left lower lobe similar to that seen on recent plain film examination. No sizable effusion or pneumothorax is noted. Upper Abdomen: Mild fatty infiltration of the liver is seen. The remainder the upper abdomen is within normal limits. Musculoskeletal: Degenerative changes of the thoracic spine are noted. No acute bony abnormality is seen. Review of the MIP images confirms the above findings. IMPRESSION: No evidence of pulmonary emboli. Mild left lower lobe atelectasis similar to that seen on prior plain film examination. No focal confluent infiltrate is seen. Electronically Signed   By: Alcide CleverMark  Lukens M.D.   On: 07/04/2016 15:47     ASSESSMENT AND PLAN:   66 year old female with past medical history of HIV, hypertension who presents to the hospital due to shortness of breath and cough and noted to be in acute respiratory failure with hypoxemia.   * Acute respiratory failure with  hypoxia due to acute bronchitis -CT chest negative for pulmonary embolism but showing some left lower lobe atelectasis. -IV steroids, Antibiotics - Scheduled Nebulizers - Inhalers -Wean O2 as tolerated - Consult pulmonary if no improvement Continues to have wheezing and shortness of breath. On 3 L oxygen.  * History of HIV-we will check viral load, CD4 count Ordered on admission and pending  - cont. HAART.   * Hypokalemia  Replace orally  * Abnormal LFT's , minimal elevation Follow labs. Improving CT scan of the chest did show fatty liver which could be the likely cause.  All the records are reviewed and case discussed with Care Management/Social Workerr. Management plans discussed with the patient, family and they are in agreement.  CODE STATUS: FULL CODE  DVT Prophylaxis: SCDs  TOTAL TIME TAKING CARE OF THIS PATIENT: 30 minutes.   POSSIBLE D/C IN 2-3 DAYS, DEPENDING ON CLINICAL CONDITION.  Milagros LollSudini, Ozell Juhasz R M.D on 07/06/2016 at 10:45 AM  Between 7am to 6pm - Pager - [phone]  After 6pm go to [email or web address] - password [email or web address]  [facility] [place] [facility]  Office  [phone]  CC: Primary care physician; [facility]  Note: This dictation was prepared with Dragon dictation along with smaller phrase technology. Any transcriptional errors that result from this process are unintentional.

## 2016-07-07 ENCOUNTER — Inpatient Hospital Stay: Payer: Medicaid Other

## 2016-07-07 MED ORDER — DARUNAVIR-COBICISTAT 800-150 MG PO TABS
1.0000 | ORAL_TABLET | Freq: Every day | ORAL | Status: DC
  Administered 2016-07-07 – 2016-07-08 (×2): 1 via ORAL
  Filled 2016-07-07 (×3): qty 1

## 2016-07-07 MED ORDER — BICTEGRAVIR-EMTRICITAB-TENOFOV 50-200-25 MG PO TABS
1.0000 | ORAL_TABLET | Freq: Every day | ORAL | Status: DC
  Administered 2016-07-07: 17:00:00 1 via ORAL
  Filled 2016-07-07 (×2): qty 1

## 2016-07-07 MED ORDER — DARUNAVIR-COBICISTAT 800-150 MG PO TABS: 1.0000 | ORAL_TABLET | Freq: Every day | ORAL | Status: DC

## 2016-07-07 MED ORDER — SULFAMETHOXAZOLE-TRIMETHOPRIM 400-80 MG/5ML IV SOLN
15.0000 mg/kg/d | Freq: Three times a day (TID) | INTRAVENOUS | Status: DC
  Administered 2016-07-07 – 2016-07-09 (×7): 481.6 mg via INTRAVENOUS
  Filled 2016-07-07 (×9): qty 30.1

## 2016-07-07 NOTE — Progress Notes (Signed)
PT Attempt Note  Patient Details Name: Alisha ClapJuanita Davis MRN: [id number] DOB: Aug 23, 1950   Attempt Treatment:    Reason Eval/Treat Not Completed: Pain limiting ability to participate. Chart reviewed. Attempted to see patient however she continues to refuse despite repeated  encouragement from therapist. 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 RN notified of refusal. RN reports that pt was recently given pain medication. Will attempt on later date/time as pt is willing to participate.  Sharalyn InkJason D Huprich PT, DPT   Huprich,Jason 07/07/2016, 9:48 AM

## 2016-07-07 NOTE — Progress Notes (Signed)
Patient ID: [name], female   DOB: [birth year], 66 y.o.   MRN: [id number]  [employer] PROGRESS NOTE  [name] WUJ:[id number] DOB: May 18, 1950 DOA: 07/04/2016 PCP: Center, [facility]  HPI/Subjective: Patient having pain in her right ribs and goes into coughing fits and has severe pain. Cough is nonproductive. Patient is wheezing.  Objective: Vitals:   07/06/16 1927 07/07/16 0400  BP: (!) 144/80 137/77  Pulse: 95 88  Resp: 19 (!) 22  Temp: 98.2 F (36.8 C) 97.7 F (36.5 C)    Filed Weights   07/04/16 1312  Weight: 96.2 kg (212 lb)    ROS: Review of Systems  Constitutional: Negative for chills and fever.  Eyes: Negative for blurred vision.  Respiratory: Positive for cough and shortness of breath.   Cardiovascular: Positive for chest pain.  Gastrointestinal: Positive for nausea. Negative for abdominal pain, constipation, diarrhea and vomiting.  Genitourinary: Negative for dysuria.  Musculoskeletal: Negative for joint pain.  Neurological: Negative for dizziness and headaches.   Exam: Physical Exam  Constitutional: She is oriented to person, place, and time.  HENT:  Nose: No mucosal edema.  Mouth/Throat: No oropharyngeal exudate or posterior oropharyngeal edema.  Eyes: Conjunctivae, EOM and lids are normal. Pupils are equal, round, and reactive to light.  Neck: No JVD present. Carotid bruit is not present. No edema present. No thyroid mass and no thyromegaly present.  Cardiovascular: S1 normal and S2 normal.  Exam reveals no gallop.   No murmur heard. Pulses:      Dorsalis pedis pulses are 2+ on the right side, and 2+ on the left side.  Respiratory: No respiratory distress. She has decreased breath sounds in the right upper field, the right middle field, the right lower field, the left upper field, the left middle field and the left lower field. She has wheezes in the right upper field, the right middle field, the right lower field, the left  upper field, the left middle field and the left lower field. She has no rhonchi. She has no rales.  GI: Soft. Bowel sounds are normal. There is no tenderness.  Musculoskeletal:       Right ankle: She exhibits swelling.       Left ankle: She exhibits swelling.  Lymphadenopathy:    She has no cervical adenopathy.  Neurological: She is alert and oriented to person, place, and time. No cranial nerve deficit.  Skin: Skin is warm. No rash noted. Nails show no clubbing.  Psychiatric: She has a normal mood and affect.      Data Reviewed: Basic Metabolic Panel:  Recent Labs Lab 07/04/16 1413 07/05/16 0430 07/06/16 0454  NA 135 135 132*  K 3.0* 3.2* 3.7  CL 101 100* 98*  CO2 24 23 27   GLUCOSE 105* 284* 352*  BUN 15 20 25*  CREATININE 1.26* 1.48* 1.32*  CALCIUM 8.8* 8.8* 9.4  MG 1.6*  --   --    Liver Function Tests:  Recent Labs Lab 07/04/16 1413 07/05/16 0430 07/06/16 0454  AST 117* 120* 84*  ALT 61* 58* 46  ALKPHOS 72 60 57  BILITOT 1.7* 1.1 1.0  PROT 8.7* 8.6* 7.9  ALBUMIN 3.3* 3.2* 3.0*   CBC:  Recent Labs Lab 07/04/16 1413 07/05/16 0430 07/06/16 0454  WBC 4.6 3.0*  3.0* 10.6  NEUTROABS 2.2 2.1 9.1*  HGB 14.5 14.2 13.5  HCT 42.1 41.5  41.4 40.0  MCV 91.2 93.1  93 93.6  PLT 139* 124*  122* 130*  BNP (last 3 results)  Recent Labs  07/04/16 1413  BNP 22.0     Studies: Dg Ribs Unilateral Right  Result Date: 07/07/2016 CLINICAL DATA:  Right lower anterior rib pain when coughing. EXAM: RIGHT RIBS - 2 VIEW COMPARISON:  Chest CT from 3 days prior FINDINGS: No fracture or other bone lesions are seen involving the ribs. Streaky opacity at the right base consistent with atelectasis. Layering gallstones. IMPRESSION: 1. Negative right rib series. 2. Atelectasis at the right base. 3. Cholelithiasis. Electronically Signed   By: Marnee SpringJonathon  Watts M.D.   On: 07/07/2016 10:26    Scheduled Meds: . amitriptyline  25 mg Oral QHS  . azithromycin  250 mg Oral Daily   . budesonide (PULMICORT) nebulizer solution  0.5 mg Nebulization BID  . dolutegravir  50 mg Oral Daily  . enoxaparin (LOVENOX) injection  40 mg Subcutaneous Q12H  . hydrochlorothiazide  25 mg Oral Daily  . ipratropium-albuterol  3 mL Nebulization TID  . methylPREDNISolone (SOLU-MEDROL) injection  40 mg Intravenous Q12H  . nystatin  5 mL Oral QID  . ramipril  10 mg Oral QPM    Assessment/Plan:  1. Acute respiratory failure with hypoxia. CT scan of the chest negative for pulmonary embolism and negative for pneumonia. Continue IV Solu-Medrol and empiric antibiotic. We'll get ID consultation. Sputum culture for PCP 2. 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 Continue heart treatment. ID consultation 3. Hypokalemia. This has been replaced 4. Increased liver function tests. Trending better 5. Essential hypertension on hydrochlorothiazide and ramipril 6. Depression on amitriptyline 7. Rib pain. X-ray negative for fracture. Could be costochondritis. Steroids will help  Code Status:     Code Status Orders        Start     Ordered   07/04/16 1832  Full code  Continuous     07/04/16 1831    Code Status History    Date Active Date Inactive Code Status Order ID Comments User Context   This patient has a current code status but no historical code status.     Disposition Plan: Evaluate daily on bronchospasm on the lungs  Time spent: 28 minutes  Alford HighlandWIETING, [place]  [employer]

## 2016-07-07 NOTE — Consult Note (Signed)
[facility] Infectious Disease     Reason for Consult HIV, PNA    Referring Physician: Karlton Lemon Date of Admission:  07/04/2016   Active Problems:   Acute respiratory failure with hypoxia ([place])   HPI: [name] is a 66 y.o. female with long standing poorly controlled HIV due to noncompliance and development of resistance, with most recent CD4 ct 202 earlier this month who was recently restarted on a new ART therapy, now with cough, SOB and hypoxia.  She on admit had no fever, wbc 4.6, CXR with interstitial process. CT angio neg for PE, some scattered opacities. CD4 87/12.4 % VL 200   Past Medical History:  Diagnosis Date  . HIV (human immunodeficiency virus infection) ([place])   . Hypertension    History reviewed. No pertinent surgical history. Social History  Substance Use Topics  . Smoking status: Never Smoker  . Smokeless tobacco: Never Used  . Alcohol use Yes   Family History  Problem Relation Age of Onset  . Adopted: Yes    Allergies: No Known Allergies  Current antibiotics: Antibiotics Given (last 72 hours)    Date/Time Action Medication Dose   07/04/16 1856 Given   azithromycin (ZITHROMAX) tablet 500 mg 500 mg   07/05/16 1055 Given   dolutegravir (TIVICAY) tablet 50 mg 50 mg   07/05/16 1740 Given   azithromycin (ZITHROMAX) tablet 250 mg 250 mg   07/06/16 1040 Given   dolutegravir (TIVICAY) tablet 50 mg 50 mg   07/06/16 1745 Given   azithromycin (ZITHROMAX) tablet 250 mg 250 mg   07/07/16 0836 Given   dolutegravir (TIVICAY) tablet 50 mg 50 mg      MEDICATIONS: . amitriptyline  25 mg Oral QHS  . azithromycin  250 mg Oral Daily  . budesonide (PULMICORT) nebulizer solution  0.5 mg Nebulization BID  . dolutegravir  50 mg Oral Daily  . enoxaparin (LOVENOX) injection  40 mg Subcutaneous Q12H  . hydrochlorothiazide  25 mg Oral Daily  . ipratropium-albuterol  3 mL Nebulization TID  . methylPREDNISolone (SOLU-MEDROL) injection  40 mg Intravenous Q12H  .  nystatin  5 mL Oral QID  . ramipril  10 mg Oral QPM    Review of Systems - 11 systems reviewed and negative per HPI   OBJECTIVE: Temp:  [97.7 F (36.5 C)-98.2 F (36.8 C)] 97.7 F (36.5 C) (05/29 0400) Pulse Rate:  [88-103] 88 (05/29 0400) Resp:  [19-22] 22 (05/29 0400) BP: (137-159)/(63-80) 137/77 (05/29 0400) SpO2:  [91 %-93 %] 93 % (05/29 0400) Physical Exam  Constitutional:  oriented to person, place, and time. Obese, on O2, some resp distress HENT: [place]/AT, PERRLA, no scleral icterus Mouth/Throat: Oropharynx is clear and moist. No oropharyngeal exudate.  Cardiovascular: Normal rate, regular rhythm and normal heart sounds. Pulmonary/Chest: poor air movement, + exp wheese, Neck = supple, no nuchal rigidity Abdominal: Soft. Bowel sounds are normal.  exhibits no distension. There is no tenderness.  Lymphadenopathy: no cervical adenopathy. No axillary adenopathy Neurological: alert and oriented to person, place, and time.  Skin: Skin is warm and dry. No rash noted. No erythema.  Psychiatric: a normal mood and affect.  behavior is normal.    LABS: Results for orders placed or performed during the hospital encounter of 07/04/16 (from the past 48 hour(s))  CBC with Differential/Platelet     Status: Abnormal   Collection Time: 07/06/16  4:54 AM  Result Value Ref Range   WBC 10.6 3.6 - 11.0 K/uL   RBC 4.28 3.80 -  5.20 MIL/uL   Hemoglobin 13.5 12.0 - 16.0 g/dL   HCT 40.0 35.0 - 47.0 %   MCV 93.6 80.0 - 100.0 fL   MCH 31.6 26.0 - 34.0 pg   MCHC 33.7 32.0 - 36.0 g/dL   RDW 15.5 (H) 11.5 - 14.5 %   Platelets 130 (L) 150 - 440 K/uL   Neutrophils Relative % 85 %   Neutro Abs 9.1 (H) 1.4 - 6.5 K/uL   Lymphocytes Relative 7 %   Lymphs Abs 0.7 (L) 1.0 - 3.6 K/uL   Monocytes Relative 8 %   Monocytes Absolute 0.8 0.2 - 0.9 K/uL   Eosinophils Relative 0 %   Eosinophils Absolute 0.0 0 - 0.7 K/uL   Basophils Relative 0 %   Basophils Absolute 0.0 0 - 0.1 K/uL  Comprehensive metabolic  panel     Status: Abnormal   Collection Time: 07/06/16  4:54 AM  Result Value Ref Range   Sodium 132 (L) 135 - 145 mmol/L   Potassium 3.7 3.5 - 5.1 mmol/L   Chloride 98 (L) 101 - 111 mmol/L   CO2 27 22 - 32 mmol/L   Glucose, Bld 352 (H) 65 - 99 mg/dL   BUN 25 (H) 6 - 20 mg/dL   Creatinine, Ser 1.32 (H) 0.44 - 1.00 mg/dL   Calcium 9.4 8.9 - 10.3 mg/dL   Total Protein 7.9 6.5 - 8.1 g/dL   Albumin 3.0 (L) 3.5 - 5.0 g/dL   AST 84 (H) 15 - 41 U/L   ALT 46 14 - 54 U/L   Alkaline Phosphatase 57 38 - 126 U/L   Total Bilirubin 1.0 0.3 - 1.2 mg/dL   GFR calc non Af Amer 42 (L) >60 mL/min   GFR calc Af Amer 49 (L) >60 mL/min    Comment: (NOTE) The eGFR has been calculated using the CKD EPI equation. This calculation has not been validated in all clinical situations. eGFR's persistently <60 mL/min signify possible Chronic Kidney Disease.    Anion gap 7 5 - 15   No components found for: ESR, C REACTIVE PROTEIN MICRO: No results found for this or any previous visit (from the past 720 hour(s)).  IMAGING: Dg Chest 2 View  Result Date: 07/04/2016 CLINICAL DATA:  Cough and congestion for several days EXAM: CHEST  2 VIEW COMPARISON:  02/03/2013 FINDINGS: Cardiac shadow is mildly enlarged. Mild platelike atelectasis is noted in the bases bilaterally. Mild vascular congestion with mild interstitial edema is seen. No sizable effusion or infiltrate is noted. Degenerative changes of the thoracic spine are seen. IMPRESSION: Mild left basilar atelectasis. Mild vascular congestion interstitial edema. Electronically Signed   By: Inez Catalina M.D.   On: 07/04/2016 14:08   Dg Ribs Unilateral Right  Result Date: 07/07/2016 CLINICAL DATA:  Right lower anterior rib pain when coughing. EXAM: RIGHT RIBS - 2 VIEW COMPARISON:  Chest CT from 3 days prior FINDINGS: No fracture or other bone lesions are seen involving the ribs. Streaky opacity at the right base consistent with atelectasis. Layering gallstones.  IMPRESSION: 1. Negative right rib series. 2. Atelectasis at the right base. 3. Cholelithiasis. Electronically Signed   By: Monte Fantasia M.D.   On: 07/07/2016 10:26   Ct Angio Chest Pe W Or Wo Contrast  Result Date: 07/04/2016 CLINICAL DATA:  Cough and congestion for several days EXAM: CT ANGIOGRAPHY CHEST WITH CONTRAST TECHNIQUE: Multidetector CT imaging of the chest was performed using the standard protocol during bolus administration of intravenous contrast. Multiplanar  CT image reconstructions and MIPs were obtained to evaluate the vascular anatomy. CONTRAST:  75 mL Isovue 370. COMPARISON:  Plain film from earlier in the same day. FINDINGS: Cardiovascular: Atherosclerotic calcifications of the aorta are noted without aneurysmal dilatation or dissection. Pulmonary artery demonstrates a normal branching pattern. No filling defects to suggest pulmonary emboli are identified. No significant cardiac enlargement is seen. No coronary calcifications are noted. Mediastinum/Nodes: No significant hilar or mediastinal adenopathy is noted. The thoracic inlet is within normal limits. Lungs/Pleura: Lungs are well aerated bilaterally. No focal infiltrate or sizable effusion is seen. Platelike atelectasis is noted in the left lower lobe similar to that seen on recent plain film examination. No sizable effusion or pneumothorax is noted. Upper Abdomen: Mild fatty infiltration of the liver is seen. The remainder the upper abdomen is within normal limits. Musculoskeletal: Degenerative changes of the thoracic spine are noted. No acute bony abnormality is seen. Review of the MIP images confirms the above findings. IMPRESSION: No evidence of pulmonary emboli. Mild left lower lobe atelectasis similar to that seen on prior plain film examination. No focal confluent infiltrate is seen. Electronically Signed   By: Inez Catalina M.D.   On: 07/04/2016 15:47    Tests: (1) Helper T-Lymph-CD4 ([id number])   Absolute CD 4 Helper [L]  203  /uL                     [phone]   % CD 4 Pos. Lymph.   [L]  10.7 %                      30.8-58.5   WBC                       3.5 x10E3/uL                3.4-10.8   RBC                       4.67 x10E6/uL               3.77-5.28   Hemoglobin                14.2 g/dL                   11.1-15.9   Hematocrit                41.9 %                      34.0-46.6   MCV                       90 fL                       79-97   MCH                       30.4 pg                     26.6-33.0   MCHC                      33.9 g/dL                   31.5-35.7   RDW                  [  H]  15.7 %                      12.3-15.4   Platelets                 150 x10E3/uL                150-379   Neutrophils               22 %                        Not Estab.   Lymphs                    55 %                        Not Estab.   Monocytes                 18 %                        Not Estab.   Eos                       5 %                         Not Estab.   Basos                     0 %                         Not Estab.  Neutrophils (Absolute)                        [L]  0.8 x10E3/uL                1.4-7.0   Lymphs (Absolute)         1.9 x10E3/uL                0.7-3.1   Monocytes(Absolute)       0.6 x10E3/uL                0.1-0.9   Eos (Absolute)            0.2 x10E3/uL                0.0-0.4   Baso (Absolute)           0.0 x10E3/uL                0.0-0.2  Assessment:   Elverda Wendel is a 66 y.o. female  With poorly controlled HIV, recent CD4 203, restarted within last few weeks on ART with prezcobix and darunavir, now admitted with cough, sob, hypoxia. Concern for PCP since not on bactrim at baseline.   Recommendations Check resp PCR panel Start bactrim Can dc azithromycin Cont steroids Restart ART with biktarvy and prezcobix.  Can dc once stable and off O2 Thank you very much for allowing me to participate in the care of this patient. Please call with questions.   Cheral Marker. Ola Spurr, MD

## 2016-07-08 LAB — URINE DRUG SCREEN, QUALITATIVE (ARMC ONLY)
Amphetamines, Ur Screen: NOT DETECTED
BARBITURATES, UR SCREEN: NOT DETECTED
BENZODIAZEPINE, UR SCRN: NOT DETECTED
CANNABINOID 50 NG, UR ~~LOC~~: NOT DETECTED
Cocaine Metabolite,Ur ~~LOC~~: NOT DETECTED
MDMA (Ecstasy)Ur Screen: NOT DETECTED
Methadone Scn, Ur: NOT DETECTED
Opiate, Ur Screen: POSITIVE — AB
PHENCYCLIDINE (PCP) UR S: NOT DETECTED
Tricyclic, Ur Screen: POSITIVE — AB

## 2016-07-08 LAB — GLUCOSE, CAPILLARY
Glucose-Capillary: 361 mg/dL — ABNORMAL HIGH (ref 65–99)
Glucose-Capillary: 414 mg/dL — ABNORMAL HIGH (ref 65–99)
Glucose-Capillary: 390 mg/dL — ABNORMAL HIGH (ref 65–99)

## 2016-07-08 LAB — RESPIRATORY PANEL BY PCR
ADENOVIRUS-RVPPCR: NOT DETECTED
Bordetella pertussis: NOT DETECTED
CORONAVIRUS NL63-RVPPCR: NOT DETECTED
CORONAVIRUS OC43-RVPPCR: NOT DETECTED
Chlamydophila pneumoniae: NOT DETECTED
Coronavirus 229E: NOT DETECTED
Coronavirus HKU1: NOT DETECTED
INFLUENZA A-RVPPCR: NOT DETECTED
Influenza B: NOT DETECTED
METAPNEUMOVIRUS-RVPPCR: NOT DETECTED
Mycoplasma pneumoniae: NOT DETECTED
PARAINFLUENZA VIRUS 1-RVPPCR: NOT DETECTED
PARAINFLUENZA VIRUS 4-RVPPCR: NOT DETECTED
Parainfluenza Virus 2: NOT DETECTED
Parainfluenza Virus 3: NOT DETECTED
RHINOVIRUS / ENTEROVIRUS - RVPPCR: NOT DETECTED
Respiratory Syncytial Virus: NOT DETECTED

## 2016-07-08 MED ORDER — QUETIAPINE FUMARATE 25 MG PO TABS
25.0000 mg | ORAL_TABLET | Freq: Every day | ORAL | Status: DC
  Administered 2016-07-08 – 2016-07-12 (×5): 25 mg via ORAL
  Filled 2016-07-08 (×5): qty 1

## 2016-07-08 MED ORDER — HALOPERIDOL LACTATE 5 MG/ML IJ SOLN
1.0000 mg | Freq: Four times a day (QID) | INTRAMUSCULAR | Status: DC | PRN
  Filled 2016-07-08: qty 1

## 2016-07-08 MED ORDER — DEXTROSE 5 % IV SOLN
1.0000 g | INTRAVENOUS | Status: DC
  Administered 2016-07-08 – 2016-07-12 (×5): 1 g via INTRAVENOUS
  Filled 2016-07-08 (×6): qty 10

## 2016-07-08 MED ORDER — BICTEGRAVIR-EMTRICITAB-TENOFOV 50-200-25 MG PO TABS
1.0000 | ORAL_TABLET | Freq: Every day | ORAL | Status: DC
  Administered 2016-07-08: 1 via ORAL
  Filled 2016-07-08 (×2): qty 1

## 2016-07-08 MED ORDER — INSULIN ASPART 100 UNIT/ML ~~LOC~~ SOLN
0.0000 [IU] | Freq: Every day | SUBCUTANEOUS | Status: DC
  Administered 2016-07-08 – 2016-07-10 (×2): 5 [IU] via SUBCUTANEOUS
  Filled 2016-07-08 (×2): qty 5

## 2016-07-08 MED ORDER — METHYLPREDNISOLONE SODIUM SUCC 40 MG IJ SOLR
40.0000 mg | Freq: Every day | INTRAMUSCULAR | Status: DC
  Administered 2016-07-09: 09:00:00 40 mg via INTRAVENOUS
  Filled 2016-07-08: qty 1

## 2016-07-08 MED ORDER — INSULIN ASPART 100 UNIT/ML ~~LOC~~ SOLN
0.0000 [IU] | Freq: Three times a day (TID) | SUBCUTANEOUS | Status: DC
  Administered 2016-07-08: 18:00:00 9 [IU] via SUBCUTANEOUS
  Administered 2016-07-09 (×2): 3 [IU] via SUBCUTANEOUS
  Administered 2016-07-09: 9 [IU] via SUBCUTANEOUS
  Administered 2016-07-10: 13:00:00 3 [IU] via SUBCUTANEOUS
  Administered 2016-07-10: 08:00:00 1 [IU] via SUBCUTANEOUS
  Administered 2016-07-10: 2 [IU] via SUBCUTANEOUS
  Administered 2016-07-11: 12:00:00 9 [IU] via SUBCUTANEOUS
  Administered 2016-07-11: 2 [IU] via SUBCUTANEOUS
  Filled 2016-07-08: qty 9
  Filled 2016-07-08: qty 2
  Filled 2016-07-08: qty 1
  Filled 2016-07-08: qty 3
  Filled 2016-07-08: qty 2
  Filled 2016-07-08 (×2): qty 3
  Filled 2016-07-08 (×2): qty 9

## 2016-07-08 NOTE — Progress Notes (Signed)
[facility] Date of Admission:  07/04/2016     ID: Danton ClapJuanita Davis is a 66 y.o. female with HIV, PNA Active Problems:   Acute respiratory failure with hypoxia (H[facility])   Subjective: Still with cough, wheeze, no fevers. Still with pain on R side   ROS  Eleven systems are reviewed and negative except per hpi  Medications:  Antibiotics Given (last 72 hours)    Date/Time Action Medication Dose Rate   07/05/16 1740 Given   azithromycin (ZITHROMAX) tablet 250 mg 250 mg    07/06/16 1040 Given   dolutegravir (TIVICAY) tablet 50 mg 50 mg    07/06/16 1745 Given   azithromycin (ZITHROMAX) tablet 250 mg 250 mg    07/07/16 0021 New Bag/Given  [loss of PIV access]   sulfamethoxazole-trimethoprim (BACTRIM) 481.6 mg in dextrose 5 % 500 mL IVPB 481.6 mg 353.4 mL/hr   07/07/16 0836 Given   dolutegravir (TIVICAY) tablet 50 mg 50 mg    07/07/16 1641 New Bag/Given   sulfamethoxazole-trimethoprim (BACTRIM) 481.6 mg in dextrose 5 % 500 mL IVPB 481.6 mg 353.4 mL/hr   07/07/16 1641 Given   darunavir-cobicistat (PREZCOBIX) 800-150 MG per tablet 1 tablet 1 tablet    07/07/16 1653 Given   bictegravir-emtricitabine-tenofovir AF (BIKTARVY) 50-200-25 MG per tablet 1 tablet 1 tablet    07/08/16 0021 New Bag/Given   sulfamethoxazole-trimethoprim (BACTRIM) 481.6 mg in dextrose 5 % 500 mL IVPB 481.6 mg 353.4 mL/hr   07/08/16 0916 Given   darunavir-cobicistat (PREZCOBIX) 800-150 MG per tablet 1 tablet 1 tablet    07/08/16 0917 New Bag/Given   sulfamethoxazole-trimethoprim (BACTRIM) 481.6 mg in dextrose 5 % 500 mL IVPB 481.6 mg 353.4 mL/hr     . amitriptyline  25 mg Oral QHS  . bictegravir-emtricitabine-tenofovir AF  1 tablet Oral Daily  . darunavir-cobicistat  1 tablet Oral Q breakfast  . enoxaparin (LOVENOX) injection  40 mg Subcutaneous Q12H  . hydrochlorothiazide  25 mg Oral Daily  . insulin aspart  0-5 Units Subcutaneous QHS  . insulin aspart  0-9 Units Subcutaneous  TID WC  . ipratropium-albuterol  3 mL Nebulization TID  . [START ON 07/09/2016] methylPREDNISolone (SOLU-MEDROL) injection  40 mg Intravenous Daily  . nystatin  5 mL Oral QID  . QUEtiapine  25 mg Oral QHS  . ramipril  10 mg Oral QPM   Objective: Vital signs in last 24 hours: Temp:  [97.7 F (36.5 C)] 97.7 F (36.5 C) (05/30 1158) Pulse Rate:  [86-95] 86 (05/30 1158) Resp:  [18-22] 18 (05/30 1158) BP: (124-144)/(62-82) 131/69 (05/30 1158) SpO2:  [86 %-93 %] 89 % (05/30 1158) Constitutional:  oriented to person, place, and time. Obese, on O2, some resp distress HENT: [place]/AT, PERRLA, no scleral icterus Mouth/Throat: Oropharynx is clear and moist. No oropharyngeal exudate.  Cardiovascular: Normal rate, regular rhythm and normal heart sounds. Pulmonary/Chest: poor air movement, + exp wheese, Neck = supple, no nuchal rigidity Abdominal: Soft. Bowel sounds are normal.  exhibits no distension. There is no tenderness.  Lymphadenopathy: no cervical adenopathy. No axillary adenopathy Neurological: alert and oriented to person, place, and time.  Skin: Skin is warm and dry. No rash noted. No erythema.  Psychiatric: a normal mood and affect.  behavior is normal.   Lab Results  Recent Labs  07/06/16 0454  WBC 10.6  HGB 13.5  HCT 40.0  NA 132*  K 3.7  CL 98*  CO2 27  BUN 25*  CREATININE 1.32*    Microbiology: Results  for orders placed or performed during the hospital encounter of 07/04/16  Respiratory Panel by PCR     Status: None   Collection Time: 07/07/16  4:57 PM  Result Value Ref Range Status   Adenovirus NOT DETECTED NOT DETECTED Final   Coronavirus 229E NOT DETECTED NOT DETECTED Final   Coronavirus HKU1 NOT DETECTED NOT DETECTED Final   Coronavirus NL63 NOT DETECTED NOT DETECTED Final   Coronavirus OC43 NOT DETECTED NOT DETECTED Final   Metapneumovirus NOT DETECTED NOT DETECTED Final   Rhinovirus / Enterovirus NOT DETECTED NOT DETECTED Final   Influenza A NOT DETECTED NOT  DETECTED Final   Influenza B NOT DETECTED NOT DETECTED Final   Parainfluenza Virus 1 NOT DETECTED NOT DETECTED Final   Parainfluenza Virus 2 NOT DETECTED NOT DETECTED Final   Parainfluenza Virus 3 NOT DETECTED NOT DETECTED Final   Parainfluenza Virus 4 NOT DETECTED NOT DETECTED Final   Respiratory Syncytial Virus NOT DETECTED NOT DETECTED Final   Bordetella pertussis NOT DETECTED NOT DETECTED Final   Chlamydophila pneumoniae NOT DETECTED NOT DETECTED Final   Mycoplasma pneumoniae NOT DETECTED NOT DETECTED Final    Comment: Performed at [facility], [address] [place], [place] [address]    Studies/Results: Dg Ribs Unilateral Right  Result Date: 07/07/2016 CLINICAL DATA:  Right lower anterior rib pain when coughing. EXAM: RIGHT RIBS - 2 VIEW COMPARISON:  Chest CT from 3 days prior FINDINGS: No fracture or other bone lesions are seen involving the ribs. Streaky opacity at the right base consistent with atelectasis. Layering gallstones. IMPRESSION: 1. Negative right rib series. 2. Atelectasis at the right base. 3. Cholelithiasis. Electronically Signed   By: Marnee Spring M.D.   On: 07/07/2016 10:26   Assessment/Plan: [name] is a 66 y.o. female  With poorly controlled HIV, recent CD4 203, restarted within last few weeks on ART with prezcobix and darunavir, now admitted with cough, sob, hypoxia. Concern for PCP since not on bactrim at baseline.  Still cough and hypoxia progressive. Resp PCR negative She has hx of cocaine use but denies any recent smoking of cocaine. Recommendations Cont bactrim Add ceftriaxone  Received 3 days of  azithromycin Cont steroids Cont ART with biktarvy and prezcobix.  Check urine legionella and urine tox screen  Thank you very much for the consult. Will follow with you.  Jarmel Linhardt P   07/08/2016, 3:07 PM

## 2016-07-08 NOTE — Plan of Care (Signed)
Problem: Education: Goal: Knowledge of [facility] Education information/materials will improve Outcome: Progressing VSS, free of falls during shift.  Reported abdominal pain 8/10, improved to 4/10 w/ Norco 5-325mg  x1.  PIV painful to flush, removed and new PIV placed by IV team.  No other needs overnight.  Bed in low position, call bell within reach.  WCTM.

## 2016-07-08 NOTE — Evaluation (Signed)
Physical Therapy Evaluation Patient Details Name: [name] MRN: [id number] DOB: 11-19-1950 Today's Date: 07/08/2016   History of Present Illness  [name]  is a 66 y.o. female with a known history of HIV, hypertension who presents to the hospital due to cough, shortness of breath ongoing for the past few days. Patient says that she has not been feeling well over the past week or so. She's had a cough which isn't productive of clear sputum associated with some exertional dyspnea. She admits to some chills but no documented fever. She admits to sick contacts with her next-door neighbor having a cold. She says that her HIV medications have recently been changed but she cannot recall why. She denies having any weight loss, hemoptysis, chest pains, abdominal pains, headache dizziness, melena hematochezia or any other associated symptoms. Patient presented to the ER and was noted to be in acute respiratory failure with hypoxia. Hospitalist services were contacted further treatment and evaluation. Pt is now admitted for respiratory failure secondary to acute bronchitis, hypokalemia, and elevated LFTs. CT angio was negative for PE.   Clinical Impression  Pt admitted with above diagnosis. Pt currently with functional limitations due to the deficits listed below (see PT Problem List).  Pt is very limited today due to respiratory status. She performs bed mobility independent with bed rails and HOB elevated. Sit to stand is CGA only. However once in standing SaO2 obtained and it is 74%. Pt sits back down on EOB and after 5 minutes SaO2 has still not exceeded 85% with notable wheezing from patient and difficulty breathing. Pt returned to supine and SaO2 recovers to 93% after 2 minutes. She is on 4L/min supplemental O2 throughout entirety of session. Pt is unsafe to return home given her activity limitation related to her respiratory status. In her current state she will need SNF placement. Pt adamantly  refuses. Hopefully her breathing will improve and discharge disposition can be updated. Pt will benefit from skilled PT services to address deficits in strength, balance, and mobility in order to return to full function at home.      Follow Up Recommendations SNF;Other (comment) (Due to respiratory status, pt refusing)    Equipment Recommendations  None recommended by PT    Recommendations for Other Services       Precautions / Restrictions Precautions Precautions: Fall Restrictions Weight Bearing Restrictions: No      Mobility  Bed Mobility Overal bed mobility: Modified Independent             General bed mobility comments: HOB elevated and use of bed rails. Increased time to perform  Transfers Overall transfer level: Needs assistance Equipment used: None Transfers: Sit to/from Stand Sit to Stand: Min guard         General transfer comment: Pt able to transfer with CGA only. Demonstrates fair stability in standing. Once in standing SaO2 obtained and it is 74%. Pt sits back down on EOB and after 5 minutes SaO2 has still not exceeded 85% with notable wheezing from patient and difficulty breathing. Pt returned to supine and SaO2 recovers to 93% after 2 minutes. She is on 4L/min supplemental O2 throughout entirety of session.   Ambulation/Gait             General Gait Details: Deferred on this date due to respiratory distress and desaturation with bed mobility/transfers  Stairs            Wheelchair Mobility    Modified Rankin (Stroke Patients Only)  Balance Overall balance assessment: Needs assistance Sitting-balance support: No upper extremity supported Sitting balance-Leahy Scale: Good     Standing balance support: No upper extremity supported Standing balance-Leahy Scale: Fair                               Pertinent Vitals/Pain Pain Assessment: No/denies pain    Home Living Family/patient expects to be discharged to::  Private residence Living Arrangements: Alone Available Help at Discharge: Friend(s) Type of Home: Apartment Home Access: Level entry     Home Layout: One level Home Equipment: Shower seat;Grab bars - tub/shower (no cane or walker)      Prior Function Level of Independence: Needs assistance   Gait / Transfers Assistance Needed: Independent ambulation without assistive device. Pt reports 1 fall in the last 6 months  ADL's / Homemaking Assistance Needed: Independent with ADLs, assist with IADLs        Hand Dominance   Dominant Hand: Right    Extremity/Trunk Assessment   Upper Extremity Assessment Upper Extremity Assessment: Overall WFL for tasks assessed    Lower Extremity Assessment Lower Extremity Assessment: Overall WFL for tasks assessed       Communication   Communication: No difficulties  Cognition Arousal/Alertness: Awake/alert Behavior During Therapy: Restless Overall Cognitive Status: Within Functional Limits for tasks assessed                                        General Comments      Exercises     Assessment/Plan    PT Assessment Patient needs continued PT services  PT Problem List Decreased activity tolerance;Cardiopulmonary status limiting activity       PT Treatment Interventions DME instruction;Gait training;Stair training;Functional mobility training;Therapeutic activities;Therapeutic exercise;Neuromuscular re-education;Balance training;Patient/family education    PT Goals (Current goals can be found in the Care Plan section)  Acute Rehab PT Goals Patient Stated Goal: "I want to go home." PT Goal Formulation: With patient Time For Goal Achievement: 07/22/16 Potential to Achieve Goals: Fair    Frequency Min 2X/week   Barriers to discharge Decreased caregiver support Lives alone    Co-evaluation               AM-PAC PT "6 Clicks" Daily Activity  Outcome Measure Difficulty turning over in bed (including  adjusting bedclothes, sheets and blankets)?: A Little Difficulty moving from lying on back to sitting on the side of the bed? : A Little Difficulty sitting down on and standing up from a chair with arms (e.g., wheelchair, bedside commode, etc,.)?: A Little Help needed moving to and from a bed to chair (including a wheelchair)?: A Little Help needed walking in hospital room?: A Little Help needed climbing 3-5 steps with a railing? : A Little 6 Click Score: 18    End of Session Equipment Utilized During Treatment: Gait belt Activity Tolerance: Treatment limited secondary to medical complications (Comment) Patient left: in bed;with call bell/phone within reach;with bed alarm set Nurse Communication: Mobility status;Other (comment) (SaO2 readings) PT Visit Diagnosis: Other abnormalities of gait and mobility (R26.89)    Time: [id number] PT Time Calculation (min) (ACUTE ONLY): 20 min   Charges:   PT Evaluation $PT Eval Moderate Complexity: 1 Procedure     PT G Codes:       Sharalyn InkJason D Huprich PT, DPT    Huprich,Jason 07/08/2016,  10:49 AM

## 2016-07-08 NOTE — Progress Notes (Signed)
Patient ID: [name], female   DOB: [birth year], 66 y.o.   MRN: [id number]  [facility] PROGRESS NOTE  [name] XBJ:[id number] DOB: [birth year] DOA: 07/04/2016 PCP: [facility]  HPI/Subjective: Patient was telling a story about a wild pig on the loose in [place]. I was unable to see this story in the news. I was able to focus her and asked some questions. She is still coughing and short of breath and wheezing.  Objective: Vitals:   07/08/16 0908 07/08/16 1158  BP: 124/62 131/69  Pulse: 89 86  Resp: (!) 22 18  Temp:  97.7 F (36.5 C)    Filed Weights   07/04/16 1312  Weight: 96.2 kg (212 lb)    ROS: Review of Systems  Unable to perform ROS: Acuity of condition  Respiratory: Positive for cough, shortness of breath and wheezing.   Cardiovascular: Positive for chest pain.  Gastrointestinal: Negative for abdominal pain.   Exam: Physical Exam  HENT:  Nose: No mucosal edema.  Mouth/Throat: No oropharyngeal exudate or posterior oropharyngeal edema.  Eyes: Conjunctivae, EOM and lids are normal. Pupils are equal, round, and reactive to light.  Neck: No JVD present. Carotid bruit is not present. No edema present. No thyroid mass and no thyromegaly present.  Cardiovascular: S1 normal and S2 normal.  Exam reveals no gallop.   No murmur heard. Pulses:      Dorsalis pedis pulses are 2+ on the right side, and 2+ on the left side.  Respiratory: No respiratory distress. She has decreased breath sounds in the right middle field, the right lower field, the left middle field and the left lower field. She has wheezes in the right middle field, the right lower field, the left middle field and the left lower field. She has no rhonchi. She has no rales.  GI: Soft. Bowel sounds are normal. There is no tenderness.  Musculoskeletal:       Right ankle: She exhibits swelling.       Left ankle: She exhibits swelling.  Lymphadenopathy:    She has no cervical  adenopathy.  Neurological: She is alert. No cranial nerve deficit.  Skin: Skin is warm. No rash noted. Nails show no clubbing.  Psychiatric: She has a normal mood and affect.      Data Reviewed: Basic Metabolic Panel:  Recent Labs Lab 07/04/16 1413 07/05/16 0430 07/06/16 0454  NA 135 135 132*  K 3.0* 3.2* 3.7  CL 101 100* 98*  CO2 24 23 27   GLUCOSE 105* 284* 352*  BUN 15 20 25*  CREATININE 1.26* 1.48* 1.32*  CALCIUM 8.8* 8.8* 9.4  MG 1.6*  --   --    Liver Function Tests:  Recent Labs Lab 07/04/16 1413 07/05/16 0430 07/06/16 0454  AST 117* 120* 84*  ALT 61* 58* 46  ALKPHOS 72 60 57  BILITOT 1.7* 1.1 1.0  PROT 8.7* 8.6* 7.9  ALBUMIN 3.3* 3.2* 3.0*   CBC:  Recent Labs Lab 07/04/16 1413 07/05/16 0430 07/06/16 0454  WBC 4.6 3.0*  3.0* 10.6  NEUTROABS 2.2 2.1 9.1*  HGB 14.5 14.2 13.5  HCT 42.1 41.5  41.4 40.0  MCV 91.2 93.1  93 93.6  PLT 139* 124*  122* 130*   BNP (last 3 results)  Recent Labs  07/04/16 1413  BNP 22.0      Recent Results (from the past 240 hour(s))  Respiratory Panel by PCR     Status: None   Collection Time: 07/07/16  4:57 PM  Result Value Ref Range Status   Adenovirus NOT DETECTED NOT DETECTED Final   Coronavirus 229E NOT DETECTED NOT DETECTED Final   Coronavirus HKU1 NOT DETECTED NOT DETECTED Final   Coronavirus NL63 NOT DETECTED NOT DETECTED Final   Coronavirus OC43 NOT DETECTED NOT DETECTED Final   Metapneumovirus NOT DETECTED NOT DETECTED Final   Rhinovirus / Enterovirus NOT DETECTED NOT DETECTED Final   Influenza A NOT DETECTED NOT DETECTED Final   Influenza B NOT DETECTED NOT DETECTED Final   Parainfluenza Virus 1 NOT DETECTED NOT DETECTED Final   Parainfluenza Virus 2 NOT DETECTED NOT DETECTED Final   Parainfluenza Virus 3 NOT DETECTED NOT DETECTED Final   Parainfluenza Virus 4 NOT DETECTED NOT DETECTED Final   Respiratory Syncytial Virus NOT DETECTED NOT DETECTED Final   Bordetella pertussis NOT DETECTED NOT  DETECTED Final   Chlamydophila pneumoniae NOT DETECTED NOT DETECTED Final   Mycoplasma pneumoniae NOT DETECTED NOT DETECTED Final    Comment: Performed at [facility], [address] [place], [place] [address]     Studies: Dg Ribs Unilateral Right  Result Date: 07/07/2016 CLINICAL DATA:  Right lower anterior rib pain when coughing. EXAM: RIGHT RIBS - 2 VIEW COMPARISON:  Chest CT from 3 days prior FINDINGS: No fracture or other bone lesions are seen involving the ribs. Streaky opacity at the right base consistent with atelectasis. Layering gallstones. IMPRESSION: 1. Negative right rib series. 2. Atelectasis at the right base. 3. Cholelithiasis. Electronically Signed   By: Marnee SpringJonathon  Watts M.D.   On: 07/07/2016 10:26    Scheduled Meds: . amitriptyline  25 mg Oral QHS  . bictegravir-emtricitabine-tenofovir AF  1 tablet Oral Daily  . darunavir-cobicistat  1 tablet Oral Q breakfast  . enoxaparin (LOVENOX) injection  40 mg Subcutaneous Q12H  . hydrochlorothiazide  25 mg Oral Daily  . insulin aspart  0-5 Units Subcutaneous QHS  . insulin aspart  0-9 Units Subcutaneous TID WC  . ipratropium-albuterol  3 mL Nebulization TID  . [START ON 07/09/2016] methylPREDNISolone (SOLU-MEDROL) injection  40 mg Intravenous Daily  . nystatin  5 mL Oral QID  . ramipril  10 mg Oral QPM   Continuous Infusions: . sulfamethoxazole-trimethoprim Stopped (07/08/16 1110)    Assessment/Plan:  1. Acute respiratory failure with hypoxia. CT scan of the chest was negative for pulmonary embolism and pneumonia. Decrease dose of Solu-Medrol down to 40 mg daily. Appreciate infectious disease consultation and antibiotics switched over to Bactrim for the possibility of PCP infection. Now on 4 L of oxygen 2. Acute hospital delirium likely secondary to steroids. Decrease steroids down to 40 mg daily. When necessary Haldol. Low-dose Seroquel at night for sleep 3. History of HIV. CD4 count 87 which would be consistent with  AIDS. Appreciate ID consultation and continue haart treatment. 4. Increased liver function tests. Trending in the right direction 5. Essential hypertension on Hydrocort thiazide and ramipril 6. Hypokalemia this has been replaced 7. Depression on amitriptyline 8.   Rib pain- could be costochondritis.  Code Status:     Code Status Orders        Start     Ordered   07/04/16 1832  Full code  Continuous     07/04/16 1831    Code Status History    Date Active Date Inactive Code Status Order ID Comments User Context   This patient has a current code status but no historical code status.      Disposition Plan: Need to be breathing better  prior to disposition  Consultants:  Infectious disease  Antibiotics:  Bactrim  Time spent: 25 minutes  Alford Highland  [employer]

## 2016-07-09 ENCOUNTER — Inpatient Hospital Stay: Payer: Medicaid Other

## 2016-07-09 LAB — BASIC METABOLIC PANEL
ANION GAP: 8 (ref 5–15)
BUN: 23 mg/dL — ABNORMAL HIGH (ref 6–20)
CO2: 30 mmol/L (ref 22–32)
Calcium: 9.3 mg/dL (ref 8.9–10.3)
Chloride: 90 mmol/L — ABNORMAL LOW (ref 101–111)
Creatinine, Ser: 1.2 mg/dL — ABNORMAL HIGH (ref 0.44–1.00)
GFR, EST AFRICAN AMERICAN: 55 mL/min — AB (ref >60)
GFR, EST NON AFRICAN AMERICAN: 47 mL/min — AB (ref >60)
Glucose, Bld: 211 mg/dL — ABNORMAL HIGH (ref 65–99)
POTASSIUM: 3.3 mmol/L — AB (ref 3.5–5.1)
SODIUM: 128 mmol/L — AB (ref 135–145)

## 2016-07-09 LAB — GLUCOSE, CAPILLARY
GLUCOSE-CAPILLARY: 252 mg/dL — AB (ref 65–99)
GLUCOSE-CAPILLARY: 212 mg/dL — AB (ref 65–99)
GLUCOSE-CAPILLARY: 361 mg/dL — AB (ref 65–99)
GLUCOSE-CAPILLARY: 422 mg/dL — AB (ref 65–99)
GLUCOSE-CAPILLARY: 161 mg/dL — AB (ref 65–99)
Glucose-Capillary: 226 mg/dL — ABNORMAL HIGH (ref 65–99)

## 2016-07-09 LAB — CBC
HCT: 41.6 % (ref 35.0–47.0)
Hemoglobin: 14.1 g/dL (ref 12.0–16.0)
MCH: 31.8 pg (ref 26.0–34.0)
MCHC: 33.9 g/dL (ref 32.0–36.0)
MCV: 94 fL (ref 80.0–100.0)
PLATELETS: 156 10*3/uL (ref 150–440)
RBC: 4.43 MIL/uL (ref 3.80–5.20)
RDW: 15.6 % — ABNORMAL HIGH (ref 11.5–14.5)
WBC: 6.3 10*3/uL (ref 3.6–11.0)

## 2016-07-09 LAB — LEGIONELLA PNEUMOPHILA SEROGP 1 UR AG: L. PNEUMOPHILA SEROGP 1 UR AG: NEGATIVE

## 2016-07-09 LAB — EXPECTORATED SPUTUM ASSESSMENT W GRAM STAIN, RFLX TO RESP C

## 2016-07-09 LAB — EXPECTORATED SPUTUM ASSESSMENT W REFEX TO RESP CULTURE

## 2016-07-09 MED ORDER — BICTEGRAVIR-EMTRICITAB-TENOFOV 50-200-25 MG PO TABS
1.0000 | ORAL_TABLET | Freq: Every day | ORAL | Status: DC
  Administered 2016-07-09 – 2016-07-13 (×5): 1 via ORAL
  Filled 2016-07-09 (×5): qty 1

## 2016-07-09 MED ORDER — DARUNAVIR-COBICISTAT 800-150 MG PO TABS
1.0000 | ORAL_TABLET | Freq: Every day | ORAL | Status: DC
  Administered 2016-07-09 – 2016-07-13 (×5): 1 via ORAL
  Filled 2016-07-09 (×5): qty 1

## 2016-07-09 MED ORDER — SODIUM CHLORIDE 0.9 % IV SOLN
INTRAVENOUS | Status: DC
  Administered 2016-07-09: 11:00:00 via INTRAVENOUS

## 2016-07-09 MED ORDER — METHYLPREDNISOLONE SODIUM SUCC 40 MG IJ SOLR
40.0000 mg | Freq: Two times a day (BID) | INTRAMUSCULAR | Status: DC
  Administered 2016-07-09 – 2016-07-11 (×4): 40 mg via INTRAVENOUS
  Filled 2016-07-09 (×4): qty 1

## 2016-07-09 MED ORDER — POTASSIUM CHLORIDE CRYS ER 20 MEQ PO TBCR
40.0000 meq | EXTENDED_RELEASE_TABLET | Freq: Once | ORAL | Status: AC
  Administered 2016-07-09: 40 meq via ORAL
  Filled 2016-07-09: qty 2

## 2016-07-09 MED ORDER — SULFAMETHOXAZOLE-TRIMETHOPRIM 800-160 MG PO TABS
2.0000 | ORAL_TABLET | Freq: Three times a day (TID) | ORAL | Status: DC
  Administered 2016-07-09 – 2016-07-13 (×12): 2 via ORAL
  Filled 2016-07-09 (×23): qty 2

## 2016-07-09 NOTE — Progress Notes (Signed)
Physical Therapy Treatment Patient Details Name: [name] MRN: [id number] DOB: 03/03/1950 Today's Date: 07/09/2016    History of Present Illness [name]  is a 66 y.o. female with a known history of HIV, hypertension who presents to the hospital due to cough, shortness of breath ongoing for the past few days. Patient says that she has not been feeling well over the past week or so. She's had a cough which isn't productive of clear sputum associated with some exertional dyspnea. She admits to some chills but no documented fever. She admits to sick contacts with her next-door neighbor having a cold. She says that her HIV medications have recently been changed but she cannot recall why. She denies having any weight loss, hemoptysis, chest pains, abdominal pains, headache dizziness, melena hematochezia or any other associated symptoms. Patient presented to the ER and was noted to be in acute respiratory failure with hypoxia. Hospitalist services were contacted further treatment and evaluation. Pt is now admitted for respiratory failure secondary to acute bronchitis, hypokalemia, and elevated LFTs. CT angio was negative for PE.     PT Comments    Pt is impulsive and generally unsafe t/o the session.  She had multiple stagger steps and 1 near fall walking w/o AD, encouraged pt to definitely use walker for now as she is not safe.  Pt on O2 t/o session, sats in mid 90s on arrival, dropped to low 90s getting to sitting and with exercises, down to low 80s with limited ambulation.     Follow Up Recommendations  SNF;Other (comment)     Equipment Recommendations       Recommendations for Other Services       Precautions / Restrictions Precautions Precautions: Fall Restrictions Weight Bearing Restrictions: No    Mobility  Bed Mobility Overal bed mobility: Modified Independent                Transfers Overall transfer level: Modified independent Equipment used: None Transfers:  Sit to/from Stand Sit to Stand: Min guard         General transfer comment: Pt able to get to standing, somewhat impulsive, poor safety awareness  Ambulation/Gait Ambulation/Gait assistance: Min assist Ambulation Distance (Feet): 25 Feet Assistive device: None       General Gait Details: Pt wanted to walk w/o AD, she was not safe and generally showed poor awareness, poor decision making and had 1 large and mutliple small stagger steps/near falls.  O2 dropped from low 90s to low 80s (on O2) with the effort.    Stairs            Wheelchair Mobility    Modified Rankin (Stroke Patients Only)       Balance           Standing balance support: No upper extremity supported Standing balance-Leahy Scale: Fair (poor decision-making)                              Cognition Arousal/Alertness: Awake/alert Behavior During Therapy: Restless Overall Cognitive Status: Within Functional Limits for tasks assessed                                        Exercises General Exercises - Lower Extremity Long Arc Quad: Strengthening;10 reps;Both Heel Slides: Strengthening;10 reps;Both Hip ABduction/ADduction: Strengthening;10 reps;Both Hip Flexion/Marching: Strengthening;10 reps;Both    General  Comments        Pertinent Vitals/Pain Pain Assessment: No/denies pain    Home Living                      Prior Function            PT Goals (current goals can now be found in the care plan section) Progress towards PT goals: Progressing toward goals    Frequency    Min 2X/week      PT Plan Current plan remains appropriate    Co-evaluation              AM-PAC PT "6 Clicks" Daily Activity  Outcome Measure  Difficulty turning over in bed (including adjusting bedclothes, sheets and blankets)?: A Little Difficulty moving from lying on back to sitting on the side of the bed? : A Little Difficulty sitting down on and standing up  from a chair with arms (e.g., wheelchair, bedside commode, etc,.)?: A Little Help needed moving to and from a bed to chair (including a wheelchair)?: A Little Help needed walking in hospital room?: A Lot Help needed climbing 3-5 steps with a railing? : A Lot 6 Click Score: 16    End of Session Equipment Utilized During Treatment: Gait belt Activity Tolerance: Treatment limited secondary to medical complications (Comment);Patient limited by fatigue Patient left: with call bell/phone within reach;with bed alarm set   PT Visit Diagnosis: Other abnormalities of gait and mobility (R26.89)     Time: [id number] PT Time Calculation (min) (ACUTE ONLY): 26 min  Charges:  $Gait Training: 8-22 mins $Therapeutic Exercise: 8-22 mins                    G Codes:       Malachi ProGalen R Lova Urbieta, DPT 07/09/2016, 5:17 PM

## 2016-07-09 NOTE — Progress Notes (Signed)
ID E NOTE Per chart review pt still on 3-4 L o2. No fevers. cxr with slight worsening of infiltrate.   Assessment/Plan: Danton ClapJuanita Vincent is a 66 y.o. female  With poorly controlled HIV, recent CD4 203, restarted within last few weeks on ART with prezcobix and darunavir, now admitted with cough, sob, hypoxia. Concern for PCP since not on bactrim at baseline.  Still cough and hypoxia progressive. Resp PCR negative She has hx of cocaine use but denies any recent smoking of cocaine. Utox neg cocaine Recommendations Cont bactrim Cont ceftriaxone  Received 3 days of  azithromycin Cont steroids but increase to 40 bid for 5 days and  Then 40 qd for 5 days then 20 qd for 11 days to complee recommended 21 day course for possible PCP  Cont ART with biktarvy and prezcobix.  Check urine legionella PCP stain and sputum culture.   Thank you very much for the consult. Will follow with you.  Desmond Szabo P   07/09/2016, 9:25 AM

## 2016-07-09 NOTE — Progress Notes (Signed)
Inpatient Diabetes Program Recommendations  AACE/ADA: New Consensus Statement on Inpatient Glycemic Control (2015)  Target Ranges:  Prepandial:   less than 140 mg/dL      Peak postprandial:   less than 180 mg/dL (1-2 hours)      Critically ill patients:  140 - 180 mg/dL   Lab Results  Component Value Date   GLUCAP 361 (H) 07/09/2016    Review of Glycemic Control:  Results for [name], [name] (MRN [id number]) as of 07/09/2016 12:37  Ref. Range 07/08/2016 20:46 07/09/2016 07:31 07/09/2016 08:51 07/09/2016 11:52 07/09/2016 11:54  Glucose-Capillary Latest Ref Range: 65 - 99 mg/dL 782390 (H) 956252 (H) 213212 (H) 422 (H) 361 (H)   Diabetes history: Type 2 diabetes  Outpatient Diabetes medications: None Current orders for Inpatient glycemic control:  Solumedrol 40 mg IV q 12 hours Novolog sensitive tid with meals and HS Inpatient Diabetes Program Recommendations:   Please consider adding Levemir 18 units daily.  Also consider adding Novolog meal coverage 4 units tid with meals. -while patient is on steroids.  Thanks, Beryl MeagerJenny Heddy Vidana, RN, BC-ADM Inpatient Diabetes Coordinator Pager [phone] (8a-5p)

## 2016-07-09 NOTE — [place] FL2 (Signed)
[place] MEDICAID FL2 LEVEL OF CARE SCREENING TOOL     IDENTIFICATION  Patient Name: [name] Birthdate: 07/19/1950 Sex: female Admission Date (Current Location): 07/04/2016  [facility] [place] [facility] [place]   Facility and Address:         Provider Number: [id number]  Attending Physician Name and Address:  Alford HighlandWieting, Richard, MD  Relative Name and Phone Number:       Current Level of Care: Hospital Recommended Level of Care: Skilled Nursing Facility Prior Approval Number:    Date Approved/Denied:   PASRR Number:    Discharge Plan: SNF    Current Diagnoses: Patient Active Problem List   Diagnosis Date Noted  . Acute respiratory failure with hypoxia (HCC) 07/04/2016    Orientation RESPIRATION BLADDER Height & Weight     Self, Time, Situation, Place  Normal Continent Weight: 212 lb (96.2 kg) Height:  5\' 1"  (154.9 cm)  BEHAVIORAL SYMPTOMS/MOOD NEUROLOGICAL BOWEL NUTRITION STATUS      Continent Diet (Regular Diet, Thin Liquids)  AMBULATORY STATUS COMMUNICATION OF NEEDS Skin   Limited Assist Verbally Normal                       Personal Care Assistance Level of Assistance  Bathing, Feeding, Dressing Bathing Assistance: Limited assistance Feeding assistance: Independent Dressing Assistance: Limited assistance     Functional Limitations Info  Sight, Hearing, Speech Sight Info: Adequate Hearing Info: Adequate Speech Info: Adequate    SPECIAL CARE FACTORS FREQUENCY  PT (By licensed PT)                    Contractures Contractures Info: Not present    Additional Factors Info  Insulin Sliding Scale, Psychotropic     Psychotropic Info: Medications:  Seroquel Insulin Sliding Scale Info: 4x/day       Current Medications (07/09/2016):  This is the current hospital active medication list Current Facility-Administered Medications  Medication Dose Route Frequency Provider Last Rate Last Dose  . 0.9 %  sodium chloride infusion    Intravenous Continuous Wieting, Richard, MD      . acetaminophen (TYLENOL) tablet 650 mg  650 mg Oral Q6H PRN Houston SirenSainani, Vivek J, MD   650 mg at 07/04/16 1856   Or  . acetaminophen (TYLENOL) suppository 650 mg  650 mg Rectal Q6H PRN Houston SirenSainani, Vivek J, MD      . amitriptyline (ELAVIL) tablet 25 mg  25 mg Oral QHS Houston SirenSainani, Vivek J, MD   25 mg at 07/08/16 2102  . darunavir-cobicistat (PREZCOBIX) 800-150 MG per tablet 1 tablet  1 tablet Oral Q breakfast Ancil BoozerStone, Taylor P, RPH   1 tablet at 07/09/16 [id number]   And  . bictegravir-emtricitabine-tenofovir AF (BIKTARVY) 50-200-25 MG per tablet 1 tablet  1 tablet Oral Q breakfast Ancil BoozerStone, Taylor P, RPH   1 tablet at 07/09/16 [id number]  . cefTRIAXone (ROCEPHIN) 1 g in dextrose 5 % 50 mL IVPB  1 g Intravenous Q24H Mick SellFitzgerald, David P, MD   Stopped at 07/08/16 1938  . enoxaparin (LOVENOX) injection 40 mg  40 mg Subcutaneous Q12H Milagros LollSudini, Srikar, MD   40 mg at 07/09/16 0912  . guaiFENesin-dextromethorphan (ROBITUSSIN DM) 100-10 MG/5ML syrup 5 mL  5 mL Oral Q4H PRN Houston SirenSainani, Vivek J, MD   5 mL at 07/06/16 2002  . haloperidol lactate (HALDOL) injection 1 mg  1 mg Intravenous Q6H PRN Alford HighlandWieting, Richard, MD      . HYDROcodone-acetaminophen (NORCO/VICODIN) 5-325 MG per tablet 1 tablet  1 tablet Oral Q6H PRN Houston Siren, MD   1 tablet at 07/07/16 2213  . insulin aspart (novoLOG) injection 0-5 Units  0-5 Units Subcutaneous QHS Alford Highland, MD   5 Units at 07/08/16 2103  . insulin aspart (novoLOG) injection 0-9 Units  0-9 Units Subcutaneous TID WC Alford Highland, MD   3 Units at 07/09/16 0911  . ipratropium-albuterol (DUONEB) 0.5-2.5 (3) MG/3ML nebulizer solution 3 mL  3 mL Nebulization TID Milagros Loll, MD   3 mL at 07/09/16 0738  . ipratropium-albuterol (DUONEB) 0.5-2.5 (3) MG/3ML nebulizer solution 3 mL  3 mL Nebulization Q6H PRN Sudini, Srikar, MD      . methylPREDNISolone sodium succinate (SOLU-MEDROL) 40 mg/mL injection 40 mg  40 mg Intravenous Q12H Mick Sell,  MD      . nystatin (MYCOSTATIN) 100000 UNIT/ML suspension 500,000 Units  5 mL Oral QID Milagros Loll, MD   500,000 Units at 07/09/16 0911  . ondansetron (ZOFRAN) tablet 4 mg  4 mg Oral Q6H PRN Houston Siren, MD       Or  . ondansetron (ZOFRAN) injection 4 mg  4 mg Intravenous Q6H PRN Houston Siren, MD      . QUEtiapine (SEROQUEL) tablet 25 mg  25 mg Oral QHS Alford Highland, MD   25 mg at 07/08/16 2102  . ramipril (ALTACE) capsule 10 mg  10 mg Oral QPM Houston Siren, MD   10 mg at 07/08/16 1754  . sulfamethoxazole-trimethoprim (BACTRIM DS,SEPTRA DS) 800-160 MG per tablet 2 tablet  2 tablet Oral TID Mick Sell, MD         Discharge Medications: Please see discharge summary for a list of discharge medications.  Relevant Imaging Results:  Relevant Lab Results:   Additional Information SSN:  [id number]  Dede Query, LCSW

## 2016-07-09 NOTE — Progress Notes (Signed)
Patient ID: [name], female   DOB: [birth year], 66 y.o.   MRN: [id number]  [employer] [facility] PROGRESS NOTE  [name] NWG:[id number] DOB: 03-03-1950 DOA: 07/04/2016 PCP: [facility]  HPI/Subjective: Nursing staff states that she's been acting appropriate while have been in there. Patient still having coughing fits. The patient stated she slept last night. She still mentioned that the pigs that she described yesterday were going to school today. Nursing staff said that she has not been talking about that with her.  Objective: Vitals:   07/09/16 0859 07/09/16 1316  BP: (!) 144/60 128/69  Pulse: 88 90  Resp: 16 16  Temp: 97.4 F (36.3 C) 97.5 F (36.4 C)    Filed Weights   07/04/16 1312  Weight: 96.2 kg (212 lb)    ROS: Review of Systems  Unable to perform ROS: Acuity of condition  Respiratory: Positive for cough, shortness of breath and wheezing.   Cardiovascular: Positive for chest pain.  Gastrointestinal: Negative for abdominal pain.   Exam: Physical Exam  HENT:  Nose: No mucosal edema.  Mouth/Throat: No oropharyngeal exudate or posterior oropharyngeal edema.  Eyes: Conjunctivae, EOM and lids are normal. Pupils are equal, round, and reactive to light.  Neck: No JVD present. Carotid bruit is not present. No edema present. No thyroid mass and no thyromegaly present.  Cardiovascular: S1 normal and S2 normal.  Exam reveals no gallop.   No murmur heard. Pulses:      Dorsalis pedis pulses are 2+ on the right side, and 2+ on the left side.  Respiratory: No respiratory distress. She has decreased breath sounds in the right middle field, the right lower field, the left middle field and the left lower field. She has wheezes in the right middle field, the right lower field, the left middle field and the left lower field. She has no rhonchi. She has no rales.  GI: Soft. Bowel sounds are normal. There is no tenderness.  Musculoskeletal:       Right  ankle: She exhibits swelling.       Left ankle: She exhibits swelling.  Lymphadenopathy:    She has no cervical adenopathy.  Neurological: She is alert. No cranial nerve deficit.  Skin: Skin is warm. No rash noted. Nails show no clubbing.  Psychiatric: She has a normal mood and affect.      Data Reviewed: Basic Metabolic Panel:  Recent Labs Lab 07/04/16 1413 07/05/16 0430 07/06/16 0454 07/09/16 0608  NA 135 135 132* 128*  K 3.0* 3.2* 3.7 3.3*  CL 101 100* 98* 90*  CO2 24 23 27 30   GLUCOSE 105* 284* 352* 211*  BUN 15 20 25* 23*  CREATININE 1.26* 1.48* 1.32* 1.20*  CALCIUM 8.8* 8.8* 9.4 9.3  MG 1.6*  --   --   --    Liver Function Tests:  Recent Labs Lab 07/04/16 1413 07/05/16 0430 07/06/16 0454  AST 117* 120* 84*  ALT 61* 58* 46  ALKPHOS 72 60 57  BILITOT 1.7* 1.1 1.0  PROT 8.7* 8.6* 7.9  ALBUMIN 3.3* 3.2* 3.0*   CBC:  Recent Labs Lab 07/04/16 1413 07/05/16 0430 07/06/16 0454 07/09/16 0608  WBC 4.6 3.0*  3.0* 10.6 6.3  NEUTROABS 2.2 2.1 9.1*  --   HGB 14.5 14.2 13.5 14.1  HCT 42.1 41.5  41.4 40.0 41.6  MCV 91.2 93.1  93 93.6 94.0  PLT 139* 124*  122* 130* 156   BNP (last 3 results)  Recent Labs  07/04/16  1413  BNP 22.0      Recent Results (from the past 240 hour(s))  Respiratory Panel by PCR     Status: None   Collection Time: 07/07/16  4:57 PM  Result Value Ref Range Status   Adenovirus NOT DETECTED NOT DETECTED Final   Coronavirus 229E NOT DETECTED NOT DETECTED Final   Coronavirus HKU1 NOT DETECTED NOT DETECTED Final   Coronavirus NL63 NOT DETECTED NOT DETECTED Final   Coronavirus OC43 NOT DETECTED NOT DETECTED Final   Metapneumovirus NOT DETECTED NOT DETECTED Final   Rhinovirus / Enterovirus NOT DETECTED NOT DETECTED Final   Influenza A NOT DETECTED NOT DETECTED Final   Influenza B NOT DETECTED NOT DETECTED Final   Parainfluenza Virus 1 NOT DETECTED NOT DETECTED Final   Parainfluenza Virus 2 NOT DETECTED NOT DETECTED Final    Parainfluenza Virus 3 NOT DETECTED NOT DETECTED Final   Parainfluenza Virus 4 NOT DETECTED NOT DETECTED Final   Respiratory Syncytial Virus NOT DETECTED NOT DETECTED Final   Bordetella pertussis NOT DETECTED NOT DETECTED Final   Chlamydophila pneumoniae NOT DETECTED NOT DETECTED Final   Mycoplasma pneumoniae NOT DETECTED NOT DETECTED Final    Comment: Performed at [facility], [address], [place], [place] [address]  Culture, expectorated sputum-assessment     Status: None   Collection Time: 07/08/16 12:48 PM  Result Value Ref Range Status   Specimen Description EXPECTORATED SPUTUM  Final   Special Requests Immunocompromised  Final   Sputum evaluation THIS SPECIMEN IS ACCEPTABLE FOR SPUTUM CULTURE  Final   Report Status 07/09/2016 FINAL  Final     Studies: Dg Chest 2 View  Result Date: 07/09/2016 CLINICAL DATA:  Pneumonia. EXAM: CHEST  2 VIEW COMPARISON:  Radiographs of Jul 04, 2016. FINDINGS: Stable cardiomediastinal silhouette. No pneumothorax is noted. Mildly increased bibasilar opacities are noted concerning for atelectasis or infiltrates, right greater than left. Minimal right pleural effusion may be present. Bony thorax is unremarkable. IMPRESSION: Mildly increased bibasilar atelectasis or infiltrates are noted, right greater than left. Electronically Signed   By: Lupita Raider, M.D.   On: 07/09/2016 08:43    Scheduled Meds: . amitriptyline  25 mg Oral QHS  . darunavir-cobicistat  1 tablet Oral Q breakfast   And  . bictegravir-emtricitabine-tenofovir AF  1 tablet Oral Q breakfast  . enoxaparin (LOVENOX) injection  40 mg Subcutaneous Q12H  . insulin aspart  0-5 Units Subcutaneous QHS  . insulin aspart  0-9 Units Subcutaneous TID WC  . ipratropium-albuterol  3 mL Nebulization TID  . methylPREDNISolone (SOLU-MEDROL) injection  40 mg Intravenous Q12H  . nystatin  5 mL Oral QID  . potassium chloride  40 mEq Oral Once  . QUEtiapine  25 mg Oral QHS  . ramipril  10 mg  Oral QPM  . sulfamethoxazole-trimethoprim  2 tablet Oral TID   Continuous Infusions: . sodium chloride 50 mL/hr at 07/09/16 1056  . cefTRIAXone (ROCEPHIN)  IV Stopped (07/08/16 1938)    Assessment/Plan:  1. Acute respiratory failure with hypoxia. CT scan of the chest was negative for pulmonary embolism and pneumonia. As per ID. Solu-Medrol 40 mg twice a day and will need a 21 day course. Appreciate infectious disease consultation and antibiotics switched over to Bactrim for the possibility of PCP infection. Now on 4 L of oxygen 2. Hyponatremia. Could be secondary to Bactrim. Could also be secondary to Hytrin or thiazide. Hydrochlorothiazide stopped and IV fluids started. 3. Acute hospital delirium likely secondary to steroids.  As per ID will need steroids twice a day for 5 days. When necessary Haldol. Low-dose Seroquel at night for sleep 4. 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 Appreciate ID consultation and continue haart treatment. 5. Increased liver function tests. Trending in the right direction 6. Essential hypertension on Hydrocort thiazide and ramipril 7. Hypokalemia this has been replaced 8. Depression on amitriptyline 8.   Rib pain- could be costochondritis.  Code Status:     Code Status Orders        Start     Ordered   07/04/16 1832  Full code  Continuous     07/04/16 1831    Code Status History    Date Active Date Inactive Code Status Order ID Comments User Context   This patient has a current code status but no historical code status.      Disposition Plan: Need to be breathing better prior to disposition  Consultants:  Infectious disease  Antibiotics:  Bactrim  Time spent: 25 minutes. Tried to leave message for her daughter Dorothy PufferBernita at [phone]  Alford HighlandWIETING, Catalina Salasar  [employer]

## 2016-07-09 NOTE — Plan of Care (Signed)
Problem: Education: Goal: Knowledge of [place] [facility] information/materials will improve Outcome: Progressing BP 175/73, SpO2 85% on 3LNC during shift, [facility]  1hr recheck, 137/67, 98% 3LNC.  No interventions needed.  Denies pain.  Requested sleep aid, received scheduled PO Seroquel 25mg , no additional interventions needed, asleep majority of shift.  Bed in low position, bed alarm on.  Call bell within reach, WCTM.

## 2016-07-10 LAB — GLUCOSE, CAPILLARY
GLUCOSE-CAPILLARY: 178 mg/dL — AB (ref 65–99)
GLUCOSE-CAPILLARY: 266 mg/dL — AB (ref 65–99)
Glucose-Capillary: 139 mg/dL — ABNORMAL HIGH (ref 65–99)
Glucose-Capillary: 217 mg/dL — ABNORMAL HIGH (ref 65–99)
Glucose-Capillary: 325 mg/dL — ABNORMAL HIGH (ref 65–99)

## 2016-07-10 LAB — BASIC METABOLIC PANEL
Anion gap: 7 (ref 5–15)
BUN: 20 mg/dL (ref 6–20)
CALCIUM: 8.9 mg/dL (ref 8.9–10.3)
CO2: 27 mmol/L (ref 22–32)
CREATININE: 1.17 mg/dL — AB (ref 0.44–1.00)
Chloride: 95 mmol/L — ABNORMAL LOW (ref 101–111)
GFR calc Af Amer: 56 mL/min — ABNORMAL LOW (ref >60)
GFR calc non Af Amer: 49 mL/min — ABNORMAL LOW (ref >60)
GLUCOSE: 170 mg/dL — AB (ref 65–99)
Potassium: 3.9 mmol/L (ref 3.5–5.1)
Sodium: 129 mmol/L — ABNORMAL LOW (ref 135–145)

## 2016-07-10 LAB — HEMOGLOBIN A1C
HEMOGLOBIN A1C: 7.6 % — AB (ref 4.8–5.6)
Mean Plasma Glucose: 171 mg/dL

## 2016-07-10 MED ORDER — METHYLPREDNISOLONE SODIUM SUCC 40 MG IJ SOLR: 20.0000 mg | INTRAMUSCULAR | Status: DC

## 2016-07-10 MED ORDER — METHYLPREDNISOLONE SODIUM SUCC 40 MG IJ SOLR: 40.0000 mg | INTRAMUSCULAR | Status: DC

## 2016-07-10 NOTE — Progress Notes (Signed)
Physical Therapy Treatment Patient Details Name: Danton ClapJuanita Royse MRN: [id number] DOB: 1951-02-07 Today's Date: 07/10/2016    History of Present Illness 66 y.o. female with a known history of HIV, hypertension who presents to the hospital due to cough, shortness of breath ongoing for the past few days. Patient says that she has not been feeling well over the past week or so. She's had a cough which isn't productive of clear sputum associated with some exertional dyspnea. She admits to some chills but no documented fever. She admits to sick contacts with her next-door neighbor having a cold. She says that her HIV medications have recently been changed but she cannot recall why. She denies having any weight loss, hemoptysis, chest pains, abdominal pains, headache dizziness, melena hematochezia or any other associated symptoms. Patient presented to the ER and was noted to be in acute respiratory failure with hypoxia. Hospitalist services were contacted further treatment and evaluation. Pt is now admitted for respiratory failure secondary to acute bronchitis, hypokalemia, and elevated LFTs. CT angio was negative for PE.     PT Comments    Pt did much better ambulating with walker today vs no AD last PT session.  She is still somewhat impulsive and unsteady, but ultimately was able to walk ~80 ft.  She was on 3, then 4 liters during ambulation with sats dropping from ~90 to low 80s relatively quickly.  Pt had some fatigue and shortness of breath, but is feeling better than last session. Pt continues to need regular cuing to stay on task.   Follow Up Recommendations  SNF (per progress continues to have poor safety awareness O2 sats)     Equipment Recommendations       Recommendations for Other Services       Precautions / Restrictions Precautions Precautions: Fall Restrictions Weight Bearing Restrictions: No    Mobility  Bed Mobility Overal bed mobility: Modified Independent                 Transfers Overall transfer level: Modified independent Equipment used: Rolling walker (2 wheeled) Transfers: Sit to/from Stand Sit to Stand: Min guard         General transfer comment: Pt able to get to standing w/o assist, definitely more steady and safer than yesterday w/o AD.  Ambulation/Gait Ambulation/Gait assistance: Min guard Ambulation Distance (Feet): 80 Feet Assistive device: Rolling walker (2 wheeled)       General Gait Details: Pt consisderably safer with walker and did not have any LOBs though she did need reminders to keep walker in appropriate positions.  She was on 3 - 4 liters O2 t/o the effort and despite focused breathing cues dropped to the low 80s relatively quickly despite feeling okay and only minimally SOB.    Stairs            Wheelchair Mobility    Modified Rankin (Stroke Patients Only)       Balance     Sitting balance-Leahy Scale: Good     Standing balance support: Bilateral upper extremity supported Standing balance-Leahy Scale: Fair                              Cognition Arousal/Alertness: Awake/alert Behavior During Therapy: Restless;Impulsive Overall Cognitive Status: Within Functional Limits for tasks assessed  Exercises General Exercises - Lower Extremity Ankle Circles/Pumps: AROM;10 reps Long Arc Quad: Strengthening;10 reps;Both Heel Slides: Strengthening;10 reps;Both Hip ABduction/ADduction: Strengthening;10 reps;Both    General Comments        Pertinent Vitals/Pain Pain Assessment: No/denies pain    Home Living                      Prior Function            PT Goals (current goals can now be found in the care plan section) Progress towards PT goals: Progressing toward goals    Frequency    Min 2X/week      PT Plan Current plan remains appropriate    Co-evaluation              AM-PAC PT "6 Clicks" Daily  Activity  Outcome Measure  Difficulty turning over in bed (including adjusting bedclothes, sheets and blankets)?: None Difficulty moving from lying on back to sitting on the side of the bed? : None Difficulty sitting down on and standing up from a chair with arms (e.g., wheelchair, bedside commode, etc,.)?: None Help needed moving to and from a bed to chair (including a wheelchair)?: None Help needed walking in hospital room?: A Little Help needed climbing 3-5 steps with a railing? : A Lot 6 Click Score: 21    End of Session Equipment Utilized During Treatment: Gait belt Activity Tolerance: Patient limited by fatigue Patient left: with chair alarm set;with call bell/phone within reach   PT Visit Diagnosis: Muscle weakness (generalized) (M62.81);Difficulty in walking, not elsewhere classified (R26.2);Other abnormalities of gait and mobility (R26.89)     Time: 4098-1191 PT Time Calculation (min) (ACUTE ONLY): 31 min  Charges:                       G Codes:       Malachi Pro, DPT 07/10/2016, 3:14 PM

## 2016-07-10 NOTE — Progress Notes (Signed)
[facility] CLINIC INFECTIOUS DISEASE PROGRESS NOTE Date of Admission:  07/04/2016     ID: [name] is a 66 y.o. female with HIV, PNA Active Problems:   Acute respiratory failure with hypoxia (HCC)   Subjective: Still with cough, wheeze, no fevers. Still with pain on R side   ROS  Eleven systems are reviewed and negative except per hpi  Medications:  Antibiotics Given (last 72 hours)    Date/Time Action Medication Dose Rate   07/07/16 1641 New Bag/Given   sulfamethoxazole-trimethoprim (BACTRIM) 481.6 mg in dextrose 5 % 500 mL IVPB 481.6 mg 353.4 mL/hr   07/07/16 1641 Given   darunavir-cobicistat (PREZCOBIX) 800-150 MG per tablet 1 tablet 1 tablet    07/07/16 1653 Given   bictegravir-emtricitabine-tenofovir AF (BIKTARVY) 50-200-25 MG per tablet 1 tablet 1 tablet    07/08/16 0021 New Bag/Given   sulfamethoxazole-trimethoprim (BACTRIM) 481.6 mg in dextrose 5 % 500 mL IVPB 481.6 mg 353.4 mL/hr   07/08/16 0916 Given   darunavir-cobicistat (PREZCOBIX) 800-150 MG per tablet 1 tablet 1 tablet    07/08/16 0917 New Bag/Given   sulfamethoxazole-trimethoprim (BACTRIM) 481.6 mg in dextrose 5 % 500 mL IVPB 481.6 mg 353.4 mL/hr   07/08/16 1700 New Bag/Given   sulfamethoxazole-trimethoprim (BACTRIM) 481.6 mg in dextrose 5 % 500 mL IVPB 481.6 mg 353.4 mL/hr   07/08/16 1752 Given   bictegravir-emtricitabine-tenofovir AF (BIKTARVY) 50-200-25 MG per tablet 1 tablet 1 tablet    07/08/16 1908 New Bag/Given   cefTRIAXone (ROCEPHIN) 1 g in dextrose 5 % 50 mL IVPB 1 g 100 mL/hr   07/09/16 0043 New Bag/Given   sulfamethoxazole-trimethoprim (BACTRIM) 481.6 mg in dextrose 5 % 500 mL IVPB 481.6 mg 353.4 mL/hr   07/09/16 1308 New Bag/Given   sulfamethoxazole-trimethoprim (BACTRIM) 481.6 mg in dextrose 5 % 500 mL IVPB 481.6 mg 353.4 mL/hr   07/09/16 0911 Given   darunavir-cobicistat (PREZCOBIX) 800-150 MG per tablet 1 tablet 1 tablet    07/09/16 0912 Given   bictegravir-emtricitabine-tenofovir AF  (BIKTARVY) 50-200-25 MG per tablet 1 tablet 1 tablet    07/09/16 1709 New Bag/Given   cefTRIAXone (ROCEPHIN) 1 g in dextrose 5 % 50 mL IVPB 1 g 100 mL/hr   07/09/16 1715 Given   sulfamethoxazole-trimethoprim (BACTRIM DS,SEPTRA DS) 800-160 MG per tablet 2 tablet 2 tablet    07/09/16 2141 Given   sulfamethoxazole-trimethoprim (BACTRIM DS,SEPTRA DS) 800-160 MG per tablet 2 tablet 2 tablet    07/10/16 0820 Given   darunavir-cobicistat (PREZCOBIX) 800-150 MG per tablet 1 tablet 1 tablet    07/10/16 0820 Given   bictegravir-emtricitabine-tenofovir AF (BIKTARVY) 50-200-25 MG per tablet 1 tablet 1 tablet    07/10/16 0820 Given   sulfamethoxazole-trimethoprim (BACTRIM DS,SEPTRA DS) 800-160 MG per tablet 2 tablet 2 tablet      . amitriptyline  25 mg Oral QHS  . darunavir-cobicistat  1 tablet Oral Q breakfast   And  . bictegravir-emtricitabine-tenofovir AF  1 tablet Oral Q breakfast  . enoxaparin (LOVENOX) injection  40 mg Subcutaneous Q12H  . insulin aspart  0-5 Units Subcutaneous QHS  . insulin aspart  0-9 Units Subcutaneous TID WC  . ipratropium-albuterol  3 mL Nebulization TID  . [START ON 07/12/2016] methylPREDNISolone (SOLU-MEDROL) injection  40 mg Intravenous Q24H   Followed by  . [START ON 07/17/2016] methylPREDNISolone (SOLU-MEDROL) injection  20 mg Intravenous Q24H  . methylPREDNISolone (SOLU-MEDROL) injection  40 mg Intravenous Q12H  . nystatin  5 mL Oral QID  . QUEtiapine  25 mg Oral QHS  .  ramipril  10 mg Oral QPM  . sulfamethoxazole-trimethoprim  2 tablet Oral TID   Objective: Vital signs in last 24 hours: Temp:  [97.6 F (36.4 C)-98.8 F (37.1 C)] 98.8 F (37.1 C) (06/01 1351) Pulse Rate:  [80-90] 90 (06/01 1351) Resp:  [18-24] 18 (06/01 1351) BP: (116-169)/(61-76) 116/71 (06/01 1351) SpO2:  [90 %-96 %] 91 % (06/01 1351) Constitutional:  oriented to person, place, and time. Obese, on O2, some resp distress HENT: [place]/AT, PERRLA, no scleral icterus Mouth/Throat: Oropharynx is  clear and moist. No oropharyngeal exudate.  Cardiovascular: Normal rate, regular rhythm and normal heart sounds. Pulmonary/Chest: poor air movement, + exp wheese, Neck = supple, no nuchal rigidity Abdominal: Soft. Bowel sounds are normal.  exhibits no distension. There is no tenderness.  Lymphadenopathy: no cervical adenopathy. No axillary adenopathy Neurological: alert and oriented to person, place, and time.  Skin: Skin is warm and dry. No rash noted. No erythema.  Psychiatric: a normal mood and affect.  behavior is normal.   Lab Results  Recent Labs  07/09/16 0608 07/10/16 0608  WBC 6.3  --   HGB 14.1  --   HCT 41.6  --   NA 128* 129*  K 3.3* 3.9  CL 90* 95*  CO2 30 27  BUN 23* 20  CREATININE 1.20* 1.17*    Microbiology: Results for orders placed or performed during the hospital encounter of 07/04/16  Respiratory Panel by PCR     Status: None   Collection Time: 07/07/16  4:57 PM  Result Value Ref Range Status   Adenovirus NOT DETECTED NOT DETECTED Final   Coronavirus 229E NOT DETECTED NOT DETECTED Final   Coronavirus HKU1 NOT DETECTED NOT DETECTED Final   Coronavirus NL63 NOT DETECTED NOT DETECTED Final   Coronavirus OC43 NOT DETECTED NOT DETECTED Final   Metapneumovirus NOT DETECTED NOT DETECTED Final   Rhinovirus / Enterovirus NOT DETECTED NOT DETECTED Final   Influenza A NOT DETECTED NOT DETECTED Final   Influenza B NOT DETECTED NOT DETECTED Final   Parainfluenza Virus 1 NOT DETECTED NOT DETECTED Final   Parainfluenza Virus 2 NOT DETECTED NOT DETECTED Final   Parainfluenza Virus 3 NOT DETECTED NOT DETECTED Final   Parainfluenza Virus 4 NOT DETECTED NOT DETECTED Final   Respiratory Syncytial Virus NOT DETECTED NOT DETECTED Final   Bordetella pertussis NOT DETECTED NOT DETECTED Final   Chlamydophila pneumoniae NOT DETECTED NOT DETECTED Final   Mycoplasma pneumoniae NOT DETECTED NOT DETECTED Final    Comment: Performed at [facility], [address]  [place], [place] [address]  Culture, expectorated sputum-assessment     Status: None   Collection Time: 07/08/16 12:48 PM  Result Value Ref Range Status   Specimen Description EXPECTORATED SPUTUM  Final   Special Requests Immunocompromised  Final   Sputum evaluation THIS SPECIMEN IS ACCEPTABLE FOR SPUTUM CULTURE  Final   Report Status 07/09/2016 FINAL  Final  Culture, respiratory (NON-Expectorated)     Status: None (Preliminary result)   Collection Time: 07/08/16 12:48 PM  Result Value Ref Range Status   Specimen Description EXPECTORATED SPUTUM  Final   Special Requests Immunocompromised Reflexed from [id number]  Final   Gram Stain   Final    MODERATE WBC PRESENT,BOTH PMN AND MONONUCLEAR RARE GRAM POSITIVE COCCI IN PAIRS RARE GRAM POSITIVE RODS    Culture   Final    TOO YOUNG TO READ Performed at [facility], [address] [place], [place] [address]  Report Status PENDING  Incomplete    Studies/Results: Dg Chest 2 View  Result Date: 07/09/2016 CLINICAL DATA:  Pneumonia. EXAM: CHEST  2 VIEW COMPARISON:  Radiographs of Jul 04, 2016. FINDINGS: Stable cardiomediastinal silhouette. No pneumothorax is noted. Mildly increased bibasilar opacities are noted concerning for atelectasis or infiltrates, right greater than left. Minimal right pleural effusion may be present. Bony thorax is unremarkable. IMPRESSION: Mildly increased bibasilar atelectasis or infiltrates are noted, right greater than left. Electronically Signed   By: Lupita Raider, M.D.   On: 07/09/2016 08:43   Assessment/Plan: [name] is a 66 y.o. female with poorly controlled HIV, recent outpatient CD4 203, restarted within last few weeks on ART with prezcobix and doultagravir, now admitted with cough, sob, hypoxia. Concern for PCP since not on bactrim at baseline. Resp PCR negative.  Urine legionella negative. She has hx of cocaine use but denies any recent smoking of cocaine and Tox screen negative. She has been quite ill  until today but finally seems slightly improved and sats improving - now on 2L O2.   Recommendations Cont bactrim but on 5/31 we changed from IV to oral as was getting 1500 fluid with the IV bactrim. Will need 21 day course of this as well as the steroids as below.   Cont ceftriaxone - sputum cx is pending.  Is s/p 3 days of  azithromycin Cont steroids at  40 bid for 5 days and then 40 qd for 5 days then 20 qd for 11 days to complee recommended 21 day course for possible PCP  Cont ART with biktarvy and prezcobix.  Thank you very much for the consult. Will follow with you.  Andres Bantz P   07/10/2016, 4:06 PM

## 2016-07-10 NOTE — Progress Notes (Addendum)
Patient ID: [name], female   DOB: 1950/03/04, 66 y.o.   MRN: [id number]   [employer] PROGRESS NOTE  [name] WUJ:[id number] DOB: 1950-05-29 DOA: 07/04/2016 PCP: [facility]  HPI/Subjective: I walked in the room the patient was sleeping and had her oxygen off. She was placed back on 2 L of oxygen.  Patient feeling a little bit better today. She isn't coughing continuously like she has been.  Objective: Vitals:   07/10/16 0839 07/10/16 1351  BP: 135/61 116/71  Pulse: 82 90  Resp: (!) 22 18  Temp: 97.6 F (36.4 C) 98.8 F (37.1 C)    Filed Weights   07/04/16 1312  Weight: 96.2 kg (212 lb)    ROS: Review of Systems  Constitutional: Negative for chills and fever.  Eyes: Negative for blurred vision.  Respiratory: Positive for cough, shortness of breath and wheezing.   Cardiovascular: Negative for chest pain.  Gastrointestinal: Negative for abdominal pain, constipation, diarrhea, nausea and vomiting.  Genitourinary: Negative for dysuria.  Musculoskeletal: Negative for joint pain.  Neurological: Negative for dizziness and headaches.   Exam: Physical Exam  HENT:  Nose: No mucosal edema.  Mouth/Throat: No oropharyngeal exudate or posterior oropharyngeal edema.  Eyes: Conjunctivae, EOM and lids are normal. Pupils are equal, round, and reactive to light.  Neck: No JVD present. Carotid bruit is not present. No edema present. No thyroid mass and no thyromegaly present.  Cardiovascular: S1 normal and S2 normal.  Exam reveals no gallop.   No murmur heard. Pulses:      Dorsalis pedis pulses are 2+ on the right side, and 2+ on the left side.  Respiratory: No respiratory distress. She has decreased breath sounds in the right lower field and the left lower field. She has wheezes in the right middle field, the right lower field, the left middle field and the left lower field. She has no rhonchi. She has no rales.  GI: Soft. Bowel sounds are normal.  There is no tenderness.  Musculoskeletal:       Right ankle: She exhibits swelling.       Left ankle: She exhibits swelling.  Lymphadenopathy:    She has no cervical adenopathy.  Neurological: She is alert. No cranial nerve deficit.  Skin: Skin is warm. No rash noted. Nails show no clubbing.  Psychiatric: She has a normal mood and affect.      Data Reviewed: Basic Metabolic Panel:  Recent Labs Lab 07/04/16 1413 07/05/16 0430 07/06/16 0454 07/09/16 0608 07/10/16 0608  NA 135 135 132* 128* 129*  K 3.0* 3.2* 3.7 3.3* 3.9  CL 101 100* 98* 90* 95*  CO2 24 23 27 30 27   GLUCOSE 105* 284* 352* 211* 170*  BUN 15 20 25* 23* 20  CREATININE 1.26* 1.48* 1.32* 1.20* 1.17*  CALCIUM 8.8* 8.8* 9.4 9.3 8.9  MG 1.6*  --   --   --   --    Liver Function Tests:  Recent Labs Lab 07/04/16 1413 07/05/16 0430 07/06/16 0454  AST 117* 120* 84*  ALT 61* 58* 46  ALKPHOS 72 60 57  BILITOT 1.7* 1.1 1.0  PROT 8.7* 8.6* 7.9  ALBUMIN 3.3* 3.2* 3.0*   CBC:  Recent Labs Lab 07/04/16 1413 07/05/16 0430 07/06/16 0454 07/09/16 0608  WBC 4.6 3.0*  3.0* 10.6 6.3  NEUTROABS 2.2 2.1 9.1*  --   HGB 14.5 14.2 13.5 14.1  HCT 42.1 41.5  41.4 40.0 41.6  MCV 91.2 93.1  93 93.6  94.0  PLT 139* 124*  122* 130* 156   BNP (last 3 results)  Recent Labs  07/04/16 1413  BNP 22.0      Recent Results (from the past 240 hour(s))  Respiratory Panel by PCR     Status: None   Collection Time: 07/07/16  4:57 PM  Result Value Ref Range Status   Adenovirus NOT DETECTED NOT DETECTED Final   Coronavirus 229E NOT DETECTED NOT DETECTED Final   Coronavirus HKU1 NOT DETECTED NOT DETECTED Final   Coronavirus NL63 NOT DETECTED NOT DETECTED Final   Coronavirus OC43 NOT DETECTED NOT DETECTED Final   Metapneumovirus NOT DETECTED NOT DETECTED Final   Rhinovirus / Enterovirus NOT DETECTED NOT DETECTED Final   Influenza A NOT DETECTED NOT DETECTED Final   Influenza B NOT DETECTED NOT DETECTED Final    Parainfluenza Virus 1 NOT DETECTED NOT DETECTED Final   Parainfluenza Virus 2 NOT DETECTED NOT DETECTED Final   Parainfluenza Virus 3 NOT DETECTED NOT DETECTED Final   Parainfluenza Virus 4 NOT DETECTED NOT DETECTED Final   Respiratory Syncytial Virus NOT DETECTED NOT DETECTED Final   Bordetella pertussis NOT DETECTED NOT DETECTED Final   Chlamydophila pneumoniae NOT DETECTED NOT DETECTED Final   Mycoplasma pneumoniae NOT DETECTED NOT DETECTED Final    Comment: Performed at [facility], [address] [place], [place] [address]  Culture, expectorated sputum-assessment     Status: None   Collection Time: 07/08/16 12:48 PM  Result Value Ref Range Status   Specimen Description EXPECTORATED SPUTUM  Final   Special Requests Immunocompromised  Final   Sputum evaluation THIS SPECIMEN IS ACCEPTABLE FOR SPUTUM CULTURE  Final   Report Status 07/09/2016 FINAL  Final  Culture, respiratory (NON-Expectorated)     Status: None (Preliminary result)   Collection Time: 07/08/16 12:48 PM  Result Value Ref Range Status   Specimen Description EXPECTORATED SPUTUM  Final   Special Requests Immunocompromised Reflexed from [id number]  Final   Gram Stain   Final    MODERATE WBC PRESENT,BOTH PMN AND MONONUCLEAR RARE GRAM POSITIVE COCCI IN PAIRS RARE GRAM POSITIVE RODS    Culture   Final    TOO YOUNG TO READ Performed at [facility], [address] [place], [place] [address]    Report Status PENDING  Incomplete     Studies: Dg Chest 2 View  Result Date: 07/09/2016 CLINICAL DATA:  Pneumonia. EXAM: CHEST  2 VIEW COMPARISON:  Radiographs of Jul 04, 2016. FINDINGS: Stable cardiomediastinal silhouette. No pneumothorax is noted. Mildly increased bibasilar opacities are noted concerning for atelectasis or infiltrates, right greater than left. Minimal right pleural effusion may be present. Bony thorax is unremarkable. IMPRESSION: Mildly increased bibasilar atelectasis or infiltrates are noted, right  greater than left. Electronically Signed   By: Lupita RaiderJames  Green Jr, M.D.   On: 07/09/2016 08:43    Scheduled Meds: . amitriptyline  25 mg Oral QHS  . darunavir-cobicistat  1 tablet Oral Q breakfast   And  . bictegravir-emtricitabine-tenofovir AF  1 tablet Oral Q breakfast  . enoxaparin (LOVENOX) injection  40 mg Subcutaneous Q12H  . insulin aspart  0-5 Units Subcutaneous QHS  . insulin aspart  0-9 Units Subcutaneous TID WC  . ipratropium-albuterol  3 mL Nebulization TID  . [START ON 07/12/2016] methylPREDNISolone (SOLU-MEDROL) injection  40 mg Intravenous Q24H   Followed by  . [START ON 07/17/2016] methylPREDNISolone (SOLU-MEDROL) injection  20 mg Intravenous Q24H  . methylPREDNISolone (SOLU-MEDROL) injection  40 mg  Intravenous Q12H  . nystatin  5 mL Oral QID  . QUEtiapine  25 mg Oral QHS  . ramipril  10 mg Oral QPM  . sulfamethoxazole-trimethoprim  2 tablet Oral TID   Continuous Infusions: . cefTRIAXone (ROCEPHIN)  IV Stopped (07/09/16 1739)    Assessment/Plan:  1. Acute respiratory failure with hypoxia. CT scan of the chest was negative for pulmonary embolism and pneumonia. As per ID, Solu-Medrol 40 mg twice a day For the first 5 days while on Bactrim then can taper to once a day and will need a 21 day course. Appreciate infectious disease consultation and antibiotics switched over to Bactrim for the possibility of PCP infection. Also on Rocephin. Now on 2 L of oxygen. Today is the first day that she's moving better air. 2. Hyponatremia. Could be secondary to Bactrim. Could also be secondary to hydrochlorothiazide. IV fluids stopped today and will recheck sodium again tomorrow 3. Acute hospital delirium likely secondary to steroids. As per ID will need steroids twice a day for 5 days from the start of Bactrim.. When necessary Haldol. Low-dose Seroquel at night for sleep 4. 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 Appreciate ID consultation and continue haart  treatment. 5. Increased liver function tests. Trending in the right direction 6. Essential hypertension on hydrochlorothiazide and ramipril 7. Hypokalemia this has been replaced 8. Depression on amitriptyline 9.   Rib pain- could be costochondritis. 10. Weakness. Physical therapy recommended rehabilitation. Patient currently refusing rehabilitation. This may make disposition Will more difficult since the patient lives alone. 11. Type 2 diabetes mellitus. Hemoglobin A1c 7.6. On sliding scale for now. Likely can be diet controlled as outpatient.   Code Status:     Code Status Orders        Start     Ordered   07/04/16 1832  Full code  Continuous     07/04/16 1831    Code Status History    Date Active Date Inactive Code Status Order ID Comments User Context   This patient has a current code status but no historical code status.      Disposition Plan: Need to be breathing better prior to disposition  Consultants:  Infectious disease  Antibiotics:  Bactrim  Time spent: 25 minutes. Spoke with daughter Dorothy Puffer on the phone yesterday and left message for her today at [phone]  Alford Highland  [employer]

## 2016-07-10 NOTE — Progress Notes (Signed)
Ch, while making rounds, visited patient to offer support and prayer   07/10/16 1115  Clinical Encounter Type  Visited With Patient  Visit Type Initial;Spiritual support  Referral From Nurse  Consult/Referral To Chaplain  Spiritual Encounters  Spiritual Needs Prayer

## 2016-07-11 LAB — GLUCOSE, CAPILLARY
GLUCOSE-CAPILLARY: 186 mg/dL — AB (ref 65–99)
GLUCOSE-CAPILLARY: 339 mg/dL — AB (ref 65–99)
GLUCOSE-CAPILLARY: 238 mg/dL — AB (ref 65–99)
Glucose-Capillary: 401 mg/dL — ABNORMAL HIGH (ref 65–99)
Glucose-Capillary: 403 mg/dL — ABNORMAL HIGH (ref 65–99)

## 2016-07-11 LAB — CULTURE, RESPIRATORY W GRAM STAIN

## 2016-07-11 LAB — CREATININE, SERUM
Creatinine, Ser: 1.13 mg/dL — ABNORMAL HIGH (ref 0.44–1.00)
GFR calc non Af Amer: 51 mL/min — ABNORMAL LOW (ref >60)
GFR, EST AFRICAN AMERICAN: 59 mL/min — AB (ref >60)

## 2016-07-11 LAB — CULTURE, RESPIRATORY: CULTURE: NORMAL

## 2016-07-11 MED ORDER — INSULIN GLARGINE 100 UNIT/ML ~~LOC~~ SOLN
8.0000 [IU] | Freq: Every day | SUBCUTANEOUS | Status: DC
  Administered 2016-07-11 – 2016-07-12 (×2): 8 [IU] via SUBCUTANEOUS
  Filled 2016-07-11 (×3): qty 0.08

## 2016-07-11 MED ORDER — INSULIN ASPART 100 UNIT/ML ~~LOC~~ SOLN
0.0000 [IU] | Freq: Three times a day (TID) | SUBCUTANEOUS | Status: DC
  Administered 2016-07-11: 17:00:00 15 [IU] via SUBCUTANEOUS
  Administered 2016-07-12: 7 [IU] via SUBCUTANEOUS
  Administered 2016-07-12 – 2016-07-13 (×3): 4 [IU] via SUBCUTANEOUS
  Filled 2016-07-11: qty 7
  Filled 2016-07-11: qty 4
  Filled 2016-07-11: qty 15
  Filled 2016-07-11 (×2): qty 4

## 2016-07-11 MED ORDER — PREDNISONE 50 MG PO TABS
60.0000 mg | ORAL_TABLET | Freq: Every day | ORAL | Status: DC
  Administered 2016-07-11: 13:00:00 60 mg via ORAL
  Filled 2016-07-11: qty 1

## 2016-07-11 MED ORDER — PREDNISOLONE 5 MG PO TABS: 60.0000 mg | ORAL_TABLET | Freq: Every day | ORAL | Status: DC

## 2016-07-11 MED ORDER — PREDNISOLONE 5 MG PO TABS
40.0000 mg | ORAL_TABLET | Freq: Two times a day (BID) | ORAL | Status: DC
  Filled 2016-07-11: qty 8

## 2016-07-11 MED ORDER — SODIUM CHLORIDE 0.9% FLUSH
3.0000 mL | Freq: Two times a day (BID) | INTRAVENOUS | Status: DC
  Administered 2016-07-11 – 2016-07-13 (×4): 3 mL via INTRAVENOUS

## 2016-07-11 MED ORDER — SODIUM CHLORIDE 0.9% FLUSH
3.0000 mL | INTRAVENOUS | Status: DC | PRN
  Administered 2016-07-11 – 2016-07-12 (×2): 3 mL via INTRAVENOUS
  Filled 2016-07-11 (×2): qty 3

## 2016-07-11 MED ORDER — PREDNISONE 20 MG PO TABS
40.0000 mg | ORAL_TABLET | Freq: Two times a day (BID) | ORAL | Status: DC
  Administered 2016-07-11 – 2016-07-13 (×4): 40 mg via ORAL
  Filled 2016-07-11 (×4): qty 2

## 2016-07-11 NOTE — Progress Notes (Signed)
Pt running elevated blood sugars with MD paged regarding diet- diet changed to diabetic carb modified diet.

## 2016-07-11 NOTE — Plan of Care (Signed)
Problem: Safety: Goal: Ability to remain free from injury will improve Outcome: Progressing Moderate fall risk, scores a "10", exit alarm activated for safety measures.

## 2016-07-11 NOTE — Progress Notes (Signed)
Patient ID: [name], female   DOB: [birth year], 66 y.o.   MRN: [id number]   [employer] PROGRESS NOTE  [name] [id number] DOB: 1950-10-17 DOA: 07/04/2016 PCP: [facility]  HPI/Subjective: Patient's breathing is improved.she is complaining of her mouth and tongue being sore  Objective: Vitals:   07/11/16 0428 07/11/16 1306  BP: 120/62 (!) 139/53  Pulse: 78 98  Resp: (!) 22 (!) 22  Temp: 98 F (36.7 C) 97.6 F (36.4 C)    Filed Weights   07/04/16 1312  Weight: 212 lb (96.2 kg)    ROS: Review of Systems  Constitutional: Negative for chills and fever.  Eyes: Negative for blurred vision.  Respiratory: Positive for cough, shortness of breath and wheezing.   Cardiovascular: Negative for chest pain.  Gastrointestinal: Negative for abdominal pain, constipation, diarrhea, nausea and vomiting.  Genitourinary: Negative for dysuria.  Musculoskeletal: Negative for joint pain.  Neurological: Negative for dizziness and headaches.   Exam: Physical Exam  HENT:  Nose: No mucosal edema.  Mouth/Throat: No oropharyngeal exudate or posterior oropharyngeal edema.  Eyes: Conjunctivae, EOM and lids are normal. Pupils are equal, round, and reactive to light.  Neck: No JVD present. Carotid bruit is not present. No edema present. No thyroid mass and no thyromegaly present.  Cardiovascular: S1 normal and S2 normal.  Exam reveals no gallop.   No murmur heard. Pulses:      Dorsalis pedis pulses are 2+ on the right side, and 2+ on the left side.  Respiratory: No respiratory distress. She has decreased breath sounds in the right lower field and the left lower field. She has wheezes in the right middle field, the right lower field, the left middle field and the left lower field. She has no rhonchi. She has no rales.  GI: Soft. Bowel sounds are normal. There is no tenderness.  Musculoskeletal:       Right ankle: She exhibits swelling.       Left ankle: She  exhibits swelling.  Lymphadenopathy:    She has no cervical adenopathy.  Neurological: She is alert. No cranial nerve deficit.  Skin: Skin is warm. No rash noted. Nails show no clubbing.  Psychiatric: She has a normal mood and affect.      Data Reviewed: Basic Metabolic Panel:  Recent Labs Lab 07/04/16 1413 07/05/16 0430 07/06/16 0454 07/09/16 0608 07/10/16 0608 07/11/16 0440  NA 135 135 132* 128* 129*  --   K 3.0* 3.2* 3.7 3.3* 3.9  --   CL 101 100* 98* 90* 95*  --   CO2 24 23 27 30 27   --   GLUCOSE 105* 284* 352* 211* 170*  --   BUN 15 20 25* 23* 20  --   CREATININE 1.26* 1.48* 1.32* 1.20* 1.17* 1.13*  CALCIUM 8.8* 8.8* 9.4 9.3 8.9  --   MG 1.6*  --   --   --   --   --    Liver Function Tests:  Recent Labs Lab 07/04/16 1413 07/05/16 0430 07/06/16 0454  AST 117* 120* 84*  ALT 61* 58* 46  ALKPHOS 72 60 57  BILITOT 1.7* 1.1 1.0  PROT 8.7* 8.6* 7.9  ALBUMIN 3.3* 3.2* 3.0*   CBC:  Recent Labs Lab 07/04/16 1413 07/05/16 0430 07/06/16 0454 07/09/16 0608  WBC 4.6 3.0*  3.0* 10.6 6.3  NEUTROABS 2.2 2.1 9.1*  --   HGB 14.5 14.2 13.5 14.1  HCT 42.1 41.5  41.4 40.0 41.6  MCV 91.2 93.1  93 93.6 94.0  PLT 139* 124*  122* 130* 156   BNP (last 3 results)  Recent Labs  07/04/16 1413  BNP 22.0      Recent Results (from the past 240 hour(s))  Respiratory Panel by PCR     Status: None   Collection Time: 07/07/16  4:57 PM  Result Value Ref Range Status   Adenovirus NOT DETECTED NOT DETECTED Final   Coronavirus 229E NOT DETECTED NOT DETECTED Final   Coronavirus HKU1 NOT DETECTED NOT DETECTED Final   Coronavirus NL63 NOT DETECTED NOT DETECTED Final   Coronavirus OC43 NOT DETECTED NOT DETECTED Final   Metapneumovirus NOT DETECTED NOT DETECTED Final   Rhinovirus / Enterovirus NOT DETECTED NOT DETECTED Final   Influenza A NOT DETECTED NOT DETECTED Final   Influenza B NOT DETECTED NOT DETECTED Final   Parainfluenza Virus 1 NOT DETECTED NOT DETECTED Final    Parainfluenza Virus 2 NOT DETECTED NOT DETECTED Final   Parainfluenza Virus 3 NOT DETECTED NOT DETECTED Final   Parainfluenza Virus 4 NOT DETECTED NOT DETECTED Final   Respiratory Syncytial Virus NOT DETECTED NOT DETECTED Final   Bordetella pertussis NOT DETECTED NOT DETECTED Final   Chlamydophila pneumoniae NOT DETECTED NOT DETECTED Final   Mycoplasma pneumoniae NOT DETECTED NOT DETECTED Final    Comment: Performed at [facility], [address] [place], [place] [address]  Culture, expectorated sputum-assessment     Status: None   Collection Time: 07/08/16 12:48 PM  Result Value Ref Range Status   Specimen Description EXPECTORATED SPUTUM  Final   Special Requests Immunocompromised  Final   Sputum evaluation THIS SPECIMEN IS ACCEPTABLE FOR SPUTUM CULTURE  Final   Report Status 07/09/2016 FINAL  Final  Culture, respiratory (NON-Expectorated)     Status: None   Collection Time: 07/08/16 12:48 PM  Result Value Ref Range Status   Specimen Description EXPECTORATED SPUTUM  Final   Special Requests Immunocompromised Reflexed from [id number]  Final   Gram Stain   Final    MODERATE WBC PRESENT,BOTH PMN AND MONONUCLEAR RARE GRAM POSITIVE COCCI IN PAIRS RARE GRAM POSITIVE RODS    Culture   Final    Consistent with normal respiratory flora. Performed at [facility], [address], [place], [place] [address]    Report Status 07/11/2016 FINAL  Final     Studies: No results found.  Scheduled Meds: . amitriptyline  25 mg Oral QHS  . darunavir-cobicistat  1 tablet Oral Q breakfast   And  . bictegravir-emtricitabine-tenofovir AF  1 tablet Oral Q breakfast  . enoxaparin (LOVENOX) injection  40 mg Subcutaneous Q12H  . insulin aspart  0-20 Units Subcutaneous TID WC  . ipratropium-albuterol  3 mL Nebulization TID  . nystatin  5 mL Oral QID  . prednisoLONE  40 mg Oral BID  . QUEtiapine  25 mg Oral QHS  . ramipril  10 mg Oral QPM  . sulfamethoxazole-trimethoprim  2 tablet Oral  TID   Continuous Infusions: . cefTRIAXone (ROCEPHIN)  IV Stopped (07/10/16 1802)    Assessment/Plan:  1. Acute respiratory failure with hypoxia. CT scan of the chest was negative for pulmonary embolism and pneumonia. As per ID, predinonse 40 mg twice a day For the first 5 days while on Bactrim then can taper to once a day and will need a 21 day course. Appreciate infectious disease consultation and antibiotics switched over to Bactrim for the possibility of PCP infection. Also on Rocephin. Now on 2 L of  oxygen. Improvement in her current condtion 2. Hyponatremia. Repeat BMP tomorrow morning 3. Acute hospital delirium likely secondary to steroids.patient mental status improved 4. 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 Appreciate ID consultation and continue haart treatment. 5. Increased liver function tests. continue to monitor 6. Essential hypertension on hydrochlorothiazide and ramipril 7. Hypokalemia this has been replaced 8. Depression on amitriptyline 9.   Rib pain- could be costochondritis. 10. Weakness. Physical therapy recommended rehabilitation. Patient currently refusing rehabilitation.  11. Type 2 diabetes mellitus. Hemoglobin A1c 7.6. On sliding scale for now. Blood sugars elevated due to steroids will start on Lantus while in the hospital   Code Status:     Code Status Orders        Start     Ordered   07/04/16 1832  Full code  Continuous     07/04/16 1831    Code Status History    Date Active Date Inactive Code Status Order ID Comments User Context   This patient has a current code status but no historical code status.      Disposition Plan: Need to be breathing better prior to disposition  Consultants:  Infectious disease  Antibiotics:  Bactrim  Time spent: 25 minutes.  Allena KatzPATEL, [facility]  [employer]

## 2016-07-12 LAB — BASIC METABOLIC PANEL
ANION GAP: 6 (ref 5–15)
BUN: 22 mg/dL — AB (ref 6–20)
CALCIUM: 8.1 mg/dL — AB (ref 8.9–10.3)
CO2: 24 mmol/L (ref 22–32)
Chloride: 97 mmol/L — ABNORMAL LOW (ref 101–111)
Creatinine, Ser: 1.22 mg/dL — ABNORMAL HIGH (ref 0.44–1.00)
GFR calc Af Amer: 53 mL/min — ABNORMAL LOW (ref >60)
GFR, EST NON AFRICAN AMERICAN: 46 mL/min — AB (ref >60)
Glucose, Bld: 173 mg/dL — ABNORMAL HIGH (ref 65–99)
POTASSIUM: 3.8 mmol/L (ref 3.5–5.1)
SODIUM: 127 mmol/L — AB (ref 135–145)

## 2016-07-12 LAB — GLUCOSE, CAPILLARY
GLUCOSE-CAPILLARY: 192 mg/dL — AB (ref 65–99)
GLUCOSE-CAPILLARY: 196 mg/dL — AB (ref 65–99)
GLUCOSE-CAPILLARY: 174 mg/dL — AB (ref 65–99)
Glucose-Capillary: 219 mg/dL — ABNORMAL HIGH (ref 65–99)

## 2016-07-12 MED ORDER — SODIUM BICARBONATE 650 MG PO TABS
650.0000 mg | ORAL_TABLET | Freq: Three times a day (TID) | ORAL | Status: DC
  Administered 2016-07-12 – 2016-07-13 (×4): 650 mg via ORAL
  Filled 2016-07-12 (×10): qty 1

## 2016-07-12 MED ORDER — ORAL CARE MOUTH RINSE
15.0000 mL | Freq: Two times a day (BID) | OROMUCOSAL | Status: DC
  Administered 2016-07-12 – 2016-07-13 (×3): 15 mL via OROMUCOSAL

## 2016-07-12 NOTE — Plan of Care (Signed)
Problem: Safety: Goal: Ability to remain free from injury will improve Outcome: Progressing High fall risk; bed exit alarm activated. Pt demonstrated use of call light.

## 2016-07-12 NOTE — Progress Notes (Signed)
Creatine up to 1.22 with pt encouraged to drink more water and drank frequently. Reports having visual misperceptions such as bed up high and TV down low with MD made aware. Sodium bicarg started. FSBS's better controlled with higher SSI and diet modification to carb controlled. Pleasant/cooperative.  Lung sounds improved.

## 2016-07-12 NOTE — Progress Notes (Signed)
Patient ID: [name], female   DOB: [birth year], 66 y.o.   MRN: [id number]   [facility] PROGRESS NOTE  [name] WUJ:[id number] DOB: 10-19-1950 DOA: 07/04/2016 PCP: [facility]  HPI/Subjective: Pt confused earlier seeing things including the TV being upside down  Objective: Vitals:   07/11/16 2030 07/12/16 0637  BP: (!) 113/50 (!) 151/63  Pulse: 91 86  Resp: 17   Temp: 98.2 F (36.8 C) 97.8 F (36.6 C)    Filed Weights   07/04/16 1312  Weight: 212 lb (96.2 kg)    ROS: Review of Systems  Constitutional: Negative for chills and fever.  Eyes: Negative for blurred vision.  Respiratory: Positive for cough, shortness of breath and wheezing.   Cardiovascular: Negative for chest pain.  Gastrointestinal: Negative for abdominal pain, constipation, diarrhea, nausea and vomiting.  Genitourinary: Negative for dysuria.  Musculoskeletal: Negative for joint pain.  Neurological: Negative for dizziness and headaches.   Exam: Physical Exam  HENT:  Nose: No mucosal edema.  Mouth/Throat: No oropharyngeal exudate or posterior oropharyngeal edema.  Eyes: Conjunctivae, EOM and lids are normal. Pupils are equal, round, and reactive to light.  Neck: No JVD present. Carotid bruit is not present. No edema present. No thyroid mass and no thyromegaly present.  Cardiovascular: S1 normal and S2 normal.  Exam reveals no gallop.   No murmur heard. Pulses:      Dorsalis pedis pulses are 2+ on the right side, and 2+ on the left side.  Respiratory: No respiratory distress. She has decreased breath sounds in the right lower field and the left lower field. She has wheezes in the right middle field, the right lower field, the left middle field and the left lower field. She has no rhonchi. She has no rales.  GI: Soft. Bowel sounds are normal. There is no tenderness.  Musculoskeletal:       Right ankle: She exhibits swelling.       Left ankle: She exhibits swelling.   Lymphadenopathy:    She has no cervical adenopathy.  Neurological: She is alert. No cranial nerve deficit.  Skin: Skin is warm. No rash noted. Nails show no clubbing.  Psychiatric: She has a normal mood and affect.      Data Reviewed: Basic Metabolic Panel:  Recent Labs Lab 07/06/16 0454 07/09/16 0608 07/10/16 0608 07/11/16 0440 07/12/16 0520  NA 132* 128* 129*  --  127*  K 3.7 3.3* 3.9  --  3.8  CL 98* 90* 95*  --  97*  CO2 27 30 27   --  24  GLUCOSE 352* 211* 170*  --  173*  BUN 25* 23* 20  --  22*  CREATININE 1.32* 1.20* 1.17* 1.13* 1.22*  CALCIUM 9.4 9.3 8.9  --  8.1*   Liver Function Tests:  Recent Labs Lab 07/06/16 0454  AST 84*  ALT 46  ALKPHOS 57  BILITOT 1.0  PROT 7.9  ALBUMIN 3.0*   CBC:  Recent Labs Lab 07/06/16 0454 07/09/16 0608  WBC 10.6 6.3  NEUTROABS 9.1*  --   HGB 13.5 14.1  HCT 40.0 41.6  MCV 93.6 94.0  PLT 130* 156   BNP (last 3 results)  Recent Labs  07/04/16 1413  BNP 22.0      Recent Results (from the past 240 hour(s))  Respiratory Panel by PCR     Status: None   Collection Time: 07/07/16  4:57 PM  Result Value Ref Range Status   Adenovirus NOT DETECTED NOT DETECTED  Final   Coronavirus 229E NOT DETECTED NOT DETECTED Final   Coronavirus HKU1 NOT DETECTED NOT DETECTED Final   Coronavirus NL63 NOT DETECTED NOT DETECTED Final   Coronavirus OC43 NOT DETECTED NOT DETECTED Final   Metapneumovirus NOT DETECTED NOT DETECTED Final   Rhinovirus / Enterovirus NOT DETECTED NOT DETECTED Final   Influenza A NOT DETECTED NOT DETECTED Final   Influenza B NOT DETECTED NOT DETECTED Final   Parainfluenza Virus 1 NOT DETECTED NOT DETECTED Final   Parainfluenza Virus 2 NOT DETECTED NOT DETECTED Final   Parainfluenza Virus 3 NOT DETECTED NOT DETECTED Final   Parainfluenza Virus 4 NOT DETECTED NOT DETECTED Final   Respiratory Syncytial Virus NOT DETECTED NOT DETECTED Final   Bordetella pertussis NOT DETECTED NOT DETECTED Final    Chlamydophila pneumoniae NOT DETECTED NOT DETECTED Final   Mycoplasma pneumoniae NOT DETECTED NOT DETECTED Final    Comment: Performed at [facility], [address] [place], [place] [address]  Culture, expectorated sputum-assessment     Status: None   Collection Time: 07/08/16 12:48 PM  Result Value Ref Range Status   Specimen Description EXPECTORATED SPUTUM  Final   Special Requests Immunocompromised  Final   Sputum evaluation THIS SPECIMEN IS ACCEPTABLE FOR SPUTUM CULTURE  Final   Report Status 07/09/2016 FINAL  Final  Culture, respiratory (NON-Expectorated)     Status: None   Collection Time: 07/08/16 12:48 PM  Result Value Ref Range Status   Specimen Description EXPECTORATED SPUTUM  Final   Special Requests Immunocompromised Reflexed from [id number]  Final   Gram Stain   Final    MODERATE WBC PRESENT,BOTH PMN AND MONONUCLEAR RARE GRAM POSITIVE COCCI IN PAIRS RARE GRAM POSITIVE RODS    Culture   Final    Consistent with normal respiratory flora. Performed at [facility], [address] [place], [place] [address]    Report Status 07/11/2016 FINAL  Final     Studies: No results found.  Scheduled Meds: . amitriptyline  25 mg Oral QHS  . darunavir-cobicistat  1 tablet Oral Q breakfast   And  . bictegravir-emtricitabine-tenofovir AF  1 tablet Oral Q breakfast  . enoxaparin (LOVENOX) injection  40 mg Subcutaneous Q12H  . insulin aspart  0-20 Units Subcutaneous TID WC  . insulin glargine  8 Units Subcutaneous QHS  . ipratropium-albuterol  3 mL Nebulization TID  . nystatin  5 mL Oral QID  . predniSONE  40 mg Oral BID WC  . QUEtiapine  25 mg Oral QHS  . ramipril  10 mg Oral QPM  . sodium bicarbonate  650 mg Oral TID  . sodium chloride flush  3 mL Intravenous Q12H  . sulfamethoxazole-trimethoprim  2 tablet Oral TID   Continuous Infusions: . cefTRIAXone (ROCEPHIN)  IV Stopped (07/11/16 1652)    Assessment/Plan:  1. Acute respiratory failure with hypoxia. CT  scan of the chest was negative for pulmonary embolism and pneumonia. As per ID, predinonse 40 mg twice a day For the first 5 days while on Bactrim then can taper to once a day and will need a 21 day course. Appreciate infectious disease consultation and continue  Bactrim for the possibility of PCP infection. Also on Rocephin. Now on 2 L of oxygen.  2. Hyponatremia. Repeat BMP start sodium cloride  3. Acute hospital delirium likely secondary to steroids.patient mental status  Waxes and wanes 4. 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 Appreciate ID consultation and continue haart  treatment. 5. Increased liver function tests. continue to monitor 6. Essential hypertension on hydrochlorothiazide and ramipril 7. Hypokalemia this has been replaced 8. Depression on amitriptyline 9.   Rib pain- could be costochondritis. 10. Weakness. Physical therapy recommended rehabilitation. Patient currently refusing rehabilitation.  11. Type 2 diabetes mellitus. Hemoglobin A1c 7.6. On sliding scale for now. Blood sugars elevated due to steroids  Continue low-dose Lantus  Code Status:     Code Status Orders        Start     Ordered   07/04/16 1832  Full code  Continuous     07/04/16 1831    Code Status History    Date Active Date Inactive Code Status Order ID Comments User Context   This patient has a current code status but no historical code status.      Disposition Plan: Need to be breathing better prior to disposition  Consultants:  Infectious disease  Antibiotics:  Bactrim  Time spent: 25 minutes.  Allena Katz [facility]  [facility]

## 2016-07-13 LAB — BASIC METABOLIC PANEL
Anion gap: 11 (ref 5–15)
BUN: 23 mg/dL — AB (ref 6–20)
CO2: 20 mmol/L — AB (ref 22–32)
CREATININE: 1.14 mg/dL — AB (ref 0.44–1.00)
Calcium: 8.4 mg/dL — ABNORMAL LOW (ref 8.9–10.3)
Chloride: 96 mmol/L — ABNORMAL LOW (ref 101–111)
GFR calc Af Amer: 58 mL/min — ABNORMAL LOW (ref >60)
GFR calc non Af Amer: 50 mL/min — ABNORMAL LOW (ref >60)
GLUCOSE: 132 mg/dL — AB (ref 65–99)
Potassium: 3.7 mmol/L (ref 3.5–5.1)
Sodium: 127 mmol/L — ABNORMAL LOW (ref 135–145)

## 2016-07-13 LAB — GLUCOSE, CAPILLARY
Glucose-Capillary: 171 mg/dL — ABNORMAL HIGH (ref 65–99)
Glucose-Capillary: 184 mg/dL — ABNORMAL HIGH (ref 65–99)

## 2016-07-13 MED ORDER — HALOPERIDOL LACTATE 5 MG/ML IJ SOLN
2.0000 mg | Freq: Once | INTRAMUSCULAR | Status: AC
  Administered 2016-07-13: 10:00:00 2 mg via INTRAVENOUS

## 2016-07-13 MED ORDER — CEFUROXIME AXETIL 500 MG PO TABS: 500.0000 mg | ORAL_TABLET | Freq: Two times a day (BID) | ORAL | 0 refills | Status: AC

## 2016-07-13 MED ORDER — IPRATROPIUM-ALBUTEROL 0.5-2.5 (3) MG/3ML IN SOLN: 3.0000 mL | Freq: Three times a day (TID) | RESPIRATORY_TRACT | 0 refills | Status: AC

## 2016-07-13 MED ORDER — PREDNISONE 20 MG PO TABS: 40.0000 mg | ORAL_TABLET | Freq: Two times a day (BID) | ORAL | Status: AC

## 2016-07-13 MED ORDER — SULFAMETHOXAZOLE-TRIMETHOPRIM 800-160 MG PO TABS: 2.0000 | ORAL_TABLET | Freq: Three times a day (TID) | ORAL | 0 refills | Status: AC

## 2016-07-13 MED ORDER — QUETIAPINE FUMARATE 25 MG PO TABS: 25.0000 mg | ORAL_TABLET | Freq: Every day | ORAL | 0 refills | Status: AC

## 2016-07-13 NOTE — Care Management (Signed)
Wanting to leave the hospital today. Boyfriend, Hessie KnowsOtis, is at the bedside. States he stays  the nights with Ms. Ernandez. [facility],  [phone]) caregiver in the home. States she works for [employer]. She is in the home 10::00am-1:00pm during the week. Will see if she can get additional care in the home. Qualifies for Home oxygen. Nebulizer treatments were given during  this hospital admission. Treatments showed some improvement, but still needed supplemental oxygen. Will need rolling walker in the home. Chose [facility]. Feliberto GottronJason Hinton, [facility] representative updated.  Discharge to home today per Dr.Patel.  Boyfriend will transport. Gwenette GreetBrenda S Shantia Sanford RN MSN CCM Care Management [phone]

## 2016-07-13 NOTE — Discharge Instructions (Addendum)
[facility] - [place] [facility]  DIET:  Diabetic diet  DISCHARGE CONDITION:  Stable  ACTIVITY:  Activity as tolerated  OXYGEN:  Home Oxygen: yes   Oxygen Delivery: 3l  DISCHARGE LOCATION:  home    ADDITIONAL DISCHARGE INSTRUCTION:   If you experience worsening of your admission symptoms, develop shortness of breath, life threatening emergency, suicidal or homicidal thoughts you must seek medical attention immediately by calling 911 or calling your MD immediately  if symptoms less severe.  You Must read complete instructions/literature along with all the possible adverse reactions/side effects for all the Medicines you take and that have been prescribed to you. Take any new Medicines after you have completely understood and accpet all the possible adverse reactions/side effects.   Please note  You were cared for by a hospitalist during your hospital stay. If you have any questions about your discharge medications or the care you received while you were in the hospital after you are discharged, you can call the unit and asked to speak with the hospitalist on call if the hospitalist that took care of you is not available. Once you are discharged, your primary care physician will handle any further medical issues. Please note that NO REFILLS for any discharge medications will be authorized once you are discharged, as it is imperative that you return to your primary care physician (or establish a relationship with a primary care physician if you do not have one) for your aftercare needs so that they can reassess your need for medications and monitor your lab values.

## 2016-07-13 NOTE — Progress Notes (Signed)
Discharge paperwork reviewed with patient who verbalized understanding. New prescriptions attached to paperwork- emphasized importance of getting filled immediately as she has medications due this evening. Patient's walker and oxygen at bedside [facility] by [facility]. Educated patient to not smoke near oxygen tanks- patient verbalized understanding. Patient very eager to leave. Boyfriend, Otis at bedside.   This RN wheeled patient out and waited for ride. Patient transported home by friend, Silvio PateShelia.

## 2016-07-13 NOTE — Plan of Care (Signed)
Problem: Safety: Goal: Ability to remain free from injury will improve Outcome: Progressing High fall risk; exit alarm activated. Pt impulsive.

## 2016-07-13 NOTE — Progress Notes (Signed)
PT Cancellation Note  Patient Details Name: [name] MRN: [id number] DOB: 1950-11-22   Cancelled Treatment:    Reason Eval/Treat Not Completed: Patient declined, no reason specified. Treatment attempted; pt up in chair, but refuses PT. Pt wishes to go home and does not feels she requires PT.    Scot DockHeidi E Barnes, PTA 07/13/2016, 12:12 PM

## 2016-07-13 NOTE — Discharge Summary (Signed)
[facility] - [place] at [facility], 66 y.o., DOB Jun 12, 1950, MRN [id number]. Admission date: 07/04/2016 Discharge Date 07/13/2016 Primary MD Center, [facility] Admitting Physician Houston Siren, MD  Admission Diagnosis  Dyspnea [R06.00] Hypoxia [R09.02] Dyspnea, unspecified type [R06.00]  Discharge Diagnosis   Active Problems:   Acute respiratory failure with hypoxia (HCC)   COPD  Acute hospital-acquired delirium Acute hospital delirium due to steroids History of HIV Increased liver function tests Essential hypertension Hyponatremia Hypokalemia Depression      Hospital Course  [name]  is a 66 y.o. female with a known history of HIV, hypertension who presents to the hospital due to cough, shortness of breath ongoing for the past few days. Patient says that she has not been feeling well over the past week or so. She's had a cough which isn't productive of clear sputum associated with some exertional dyspnea. Patient was admitted for further evaluation and therapy. She was treated for possible pneumonia. Patient underwent a CT scan of the chest which failed to show any evidence of pneumonia or pulmonary embolism. She was seen in consultation by infectious disease who recommended treating her with Bactrim for presumed PCP with steroids and antibiotics. Patient also had intermittent hospital delirium. She has been treated with steroids and antibiotics. She has been slow to improve. Today she is very anxious to go home. I explained to her that she needs to continue to remain in the hospital. However patient insisted on going home at this point I will have to discharge her because she still needs oxygen and antibiotics which she will be given.            Consults  ID  Significant Tests:  See full reports for all details    Dg Chest 2 View  Result Date: 07/09/2016 CLINICAL DATA:  Pneumonia. EXAM: CHEST  2 VIEW COMPARISON:   Radiographs of Jul 04, 2016. FINDINGS: Stable cardiomediastinal silhouette. No pneumothorax is noted. Mildly increased bibasilar opacities are noted concerning for atelectasis or infiltrates, right greater than left. Minimal right pleural effusion may be present. Bony thorax is unremarkable. IMPRESSION: Mildly increased bibasilar atelectasis or infiltrates are noted, right greater than left. Electronically Signed   By: Lupita Raider, M.D.   On: 07/09/2016 08:43   Dg Chest 2 View  Result Date: 07/04/2016 CLINICAL DATA:  Cough and congestion for several days EXAM: CHEST  2 VIEW COMPARISON:  02/03/2013 FINDINGS: Cardiac shadow is mildly enlarged. Mild platelike atelectasis is noted in the bases bilaterally. Mild vascular congestion with mild interstitial edema is seen. No sizable effusion or infiltrate is noted. Degenerative changes of the thoracic spine are seen. IMPRESSION: Mild left basilar atelectasis. Mild vascular congestion interstitial edema. Electronically Signed   By: Alcide Clever M.D.   On: 07/04/2016 14:08   Dg Ribs Unilateral Right  Result Date: 07/07/2016 CLINICAL DATA:  Right lower anterior rib pain when coughing. EXAM: RIGHT RIBS - 2 VIEW COMPARISON:  Chest CT from 3 days prior FINDINGS: No fracture or other bone lesions are seen involving the ribs. Streaky opacity at the right base consistent with atelectasis. Layering gallstones. IMPRESSION: 1. Negative right rib series. 2. Atelectasis at the right base. 3. Cholelithiasis. Electronically Signed   By: Marnee Spring M.D.   On: 07/07/2016 10:26   Ct Angio Chest Pe W Or Wo Contrast  Result Date: 07/04/2016 CLINICAL DATA:  Cough and congestion for several days EXAM: CT ANGIOGRAPHY CHEST WITH CONTRAST TECHNIQUE: Multidetector  CT imaging of the chest was performed using the standard protocol during bolus administration of intravenous contrast. Multiplanar CT image reconstructions and MIPs were obtained to evaluate the vascular anatomy.  CONTRAST:  75 mL Isovue 370. COMPARISON:  Plain film from earlier in the same day. FINDINGS: Cardiovascular: Atherosclerotic calcifications of the aorta are noted without aneurysmal dilatation or dissection. Pulmonary artery demonstrates a normal branching pattern. No filling defects to suggest pulmonary emboli are identified. No significant cardiac enlargement is seen. No coronary calcifications are noted. Mediastinum/Nodes: No significant hilar or mediastinal adenopathy is noted. The thoracic inlet is within normal limits. Lungs/Pleura: Lungs are well aerated bilaterally. No focal infiltrate or sizable effusion is seen. Platelike atelectasis is noted in the left lower lobe similar to that seen on recent plain film examination. No sizable effusion or pneumothorax is noted. Upper Abdomen: Mild fatty infiltration of the liver is seen. The remainder the upper abdomen is within normal limits. Musculoskeletal: Degenerative changes of the thoracic spine are noted. No acute bony abnormality is seen. Review of the MIP images confirms the above findings. IMPRESSION: No evidence of pulmonary emboli. Mild left lower lobe atelectasis similar to that seen on prior plain film examination. No focal confluent infiltrate is seen. Electronically Signed   By: Alcide Clever M.D.   On: 07/04/2016 15:47       Today   Subjective:   [name]  patient very anxious and wanting to go home.  Objective:   Blood pressure (!) 149/63, pulse 95, temperature 97.5 F (36.4 C), temperature source Oral, resp. rate 20, height 5\' 1"  (1.549 m), weight 212 lb (96.2 kg), SpO2 98 %.  .  Intake/Output Summary (Last 24 hours) at 07/13/16 1257 Last data filed at 07/13/16 0800  Gross per 24 hour  Intake               50 ml  Output                0 ml  Net               50 ml    Exam VITAL SIGNS: Blood pressure (!) 149/63, pulse 95, temperature 97.5 F (36.4 C), temperature source Oral, resp. rate 20, height 5\' 1"  (1.549 m), weight  212 lb (96.2 kg), SpO2 98 %.  GENERAL:  66 y.o.-year-old patient lying in the bed with no acute distress.  EYES: Pupils equal, round, reactive to light and accommodation. No scleral icterus. Extraocular muscles intact.  HEENT: Head atraumatic, normocephalic. Oropharynx and nasopharynx clear.  NECK:  Supple, no jugular venous distention. No thyroid enlargement, no tenderness.  LUNGS: Decreased breath sounds bilateral CARDIOVASCULAR: S1, S2 normal. No murmurs, rubs, or gallops.  ABDOMEN: Soft, nontender, nondistended. Bowel sounds present. No organomegaly or mass.  EXTREMITIES: No pedal edema, cyanosis, or clubbing.  NEUROLOGIC: Cranial nerves II through XII are intact. Muscle strength 5/5 in all extremities. Sensation intact. Gait not checked.  PSYCHIATRIC: The patient is alert and oriented x 3.  SKIN: No obvious rash, lesion, or ulcer.   Data Review     CBC w Diff: Lab Results  Component Value Date   WBC 6.3 07/09/2016   HGB 14.1 07/09/2016   HGB 14.4 02/05/2013   HCT 41.6 07/09/2016   HCT 41.4 07/05/2016   PLT 156 07/09/2016   PLT 122 (L) 07/05/2016   LYMPHOPCT 7 07/06/2016   LYMPHOPCT 18.8 02/05/2013   MONOPCT 8 07/06/2016   MONOPCT 11.8 02/05/2013   EOSPCT 0 07/06/2016  EOSPCT 0.1 02/05/2013   BASOPCT 0 07/06/2016   BASOPCT 0.1 02/05/2013   CMP: Lab Results  Component Value Date   NA 127 (L) 07/13/2016   NA 134 (L) 02/10/2013   K 3.7 07/13/2016   K 4.8 02/10/2013   CL 96 (L) 07/13/2016   CL 102 02/10/2013   CO2 20 (L) 07/13/2016   CO2 27 02/10/2013   BUN 23 (H) 07/13/2016   BUN 16 02/10/2013   CREATININE 1.14 (H) 07/13/2016   CREATININE 0.86 02/10/2013   PROT 7.9 07/06/2016   PROT 7.1 02/04/2013   ALBUMIN 3.0 (L) 07/06/2016   ALBUMIN 2.3 (L) 02/04/2013   BILITOT 1.0 07/06/2016   BILITOT 0.8 02/04/2013   ALKPHOS 57 07/06/2016   ALKPHOS 44 (L) 02/04/2013   AST 84 (H) 07/06/2016   AST 80 (H) 02/04/2013   ALT 46 07/06/2016   ALT 30 02/04/2013  .  Micro  Results Recent Results (from the past 240 hour(s))  Respiratory Panel by PCR     Status: None   Collection Time: 07/07/16  4:57 PM  Result Value Ref Range Status   Adenovirus NOT DETECTED NOT DETECTED Final   Coronavirus 229E NOT DETECTED NOT DETECTED Final   Coronavirus HKU1 NOT DETECTED NOT DETECTED Final   Coronavirus NL63 NOT DETECTED NOT DETECTED Final   Coronavirus OC43 NOT DETECTED NOT DETECTED Final   Metapneumovirus NOT DETECTED NOT DETECTED Final   Rhinovirus / Enterovirus NOT DETECTED NOT DETECTED Final   Influenza A NOT DETECTED NOT DETECTED Final   Influenza B NOT DETECTED NOT DETECTED Final   Parainfluenza Virus 1 NOT DETECTED NOT DETECTED Final   Parainfluenza Virus 2 NOT DETECTED NOT DETECTED Final   Parainfluenza Virus 3 NOT DETECTED NOT DETECTED Final   Parainfluenza Virus 4 NOT DETECTED NOT DETECTED Final   Respiratory Syncytial Virus NOT DETECTED NOT DETECTED Final   Bordetella pertussis NOT DETECTED NOT DETECTED Final   Chlamydophila pneumoniae NOT DETECTED NOT DETECTED Final   Mycoplasma pneumoniae NOT DETECTED NOT DETECTED Final    Comment: Performed at [facility], [address] [place], [place] [address]  Culture, expectorated sputum-assessment     Status: None   Collection Time: 07/08/16 12:48 PM  Result Value Ref Range Status   Specimen Description EXPECTORATED SPUTUM  Final   Special Requests Immunocompromised  Final   Sputum evaluation THIS SPECIMEN IS ACCEPTABLE FOR SPUTUM CULTURE  Final   Report Status 07/09/2016 FINAL  Final  Culture, respiratory (NON-Expectorated)     Status: None   Collection Time: 07/08/16 12:48 PM  Result Value Ref Range Status   Specimen Description EXPECTORATED SPUTUM  Final   Special Requests Immunocompromised Reflexed from [id number]  Final   Gram Stain   Final    MODERATE WBC PRESENT,BOTH PMN AND MONONUCLEAR RARE GRAM POSITIVE COCCI IN PAIRS RARE GRAM POSITIVE RODS    Culture   Final    Consistent with normal  respiratory flora. Performed at [facility], [address] [place], [place] [address]    Report Status 07/11/2016 FINAL  Final        Code Status Orders        Start     Ordered   07/04/16 1832  Full code  Continuous     07/04/16 1831    Code Status History    Date Active Date Inactive Code Status Order ID Comments User Context   This patient has a current code status but no historical code  status.          Follow-up Information    [facility] in 1 week([facility]).   Contact information: [address] [place] [address] [phone]        Mick SellFitzgerald, David P, MD Follow up in 1 week(s).   Specialty:  Infectious Diseases Contact information: [address] [place] [address] [phone]           Discharge Medications   Allergies as of 07/13/2016   No Known Allergies     Medication List    STOP taking these medications   hydrochlorothiazide 25 MG tablet Commonly known as:  HYDRODIURIL     TAKE these medications   amitriptyline 25 MG tablet Commonly known as:  ELAVIL Take 25 mg by mouth at bedtime.   cefUROXime 500 MG tablet Commonly known as:  CEFTIN Take 1 tablet (500 mg total) by mouth 2 (two) times daily with a meal.   ipratropium-albuterol 0.5-2.5 (3) MG/3ML Soln Commonly known as:  DUONEB Take 3 mLs by nebulization 3 (three) times daily.   predniSONE 20 MG tablet Commonly known as:  DELTASONE Take 2 tablets (40 mg total) by mouth 2 (two) times daily with a meal.   PREZCOBIX 800-150 MG tablet Generic drug:  darunavir-cobicistat Take 1 tablet by mouth daily with breakfast. Swallow whole. Do NOT crush, break or chew tablets. Take with food.   QUEtiapine 25 MG tablet Commonly known as:  SEROQUEL Take 1 tablet (25 mg total) by mouth at bedtime.   ramipril 10 MG capsule Commonly known as:  ALTACE Take 10 mg by mouth every evening.   sulfamethoxazole-trimethoprim 800-160 MG  tablet Commonly known as:  BACTRIM DS,SEPTRA DS Take 2 tablets by mouth 3 (three) times daily.   TIVICAY 50 MG tablet Generic drug:  dolutegravir Take 50 mg by mouth daily.            Durable Medical Equipment        Start     Ordered   07/13/16 1257  For home use only DME oxygen  Once    Question Answer Comment  Mode or (Route) Nasal cannula   Liters per Minute 3   Frequency Continuous (stationary and portable oxygen unit needed)   Oxygen delivery system Gas      07/13/16 1256   07/13/16 1045  For home use only DME Nebulizer machine  Once    Question:  Patient needs a nebulizer to treat with the following condition  Answer:  COPD (chronic obstructive pulmonary disease) (HCC)   07/13/16 1044   07/13/16 1044  For home use only DME Walker rolling  Once    Question:  Patient needs a walker to treat with the following condition  Answer:  Weakness   07/13/16 1044         Total Time in preparing paper work, data evaluation and todays exam - 35 minutes  Auburn BilberryPATEL, Arlana Canizales M.D on 07/13/2016 at 12:57 PM  [facility]   Office  [phone]

## 2016-07-13 NOTE — Progress Notes (Signed)
Pt with copd need home oxygen

## 2016-07-13 NOTE — Progress Notes (Signed)
SATURATION QUALIFICATIONS: (This note is used to comply with regulatory documentation for home oxygen)  Patient Saturations on Room Air at Rest = 87%  Patient Saturations on Room Air while Ambulating = 85%  Patient Saturations on 2 Liters of oxygen while Ambulating = 95%  Please briefly explain why patient needs home oxygen: COPD

## 2016-07-13 NOTE — Progress Notes (Signed)
Pt has become very agitated, irrational, and will not listen to staff. Dr. Allena KatzPatel notified patient insisting she is leaving and going home now. Boyfriend at bedside. Patient is still very short of breath, requiring oxygen, and unsteady gait.   Dr. Allena KatzPatel to come see patient now.

## 2016-07-13 NOTE — Progress Notes (Signed)
Pt has pulled IV out and is refusing further treatment in the hospital despite explaining to patient the importance of taking ordered medications for her health to get back to baseline. Pt's boyfriend at bedside observing.   Waiting for discharge paperwork and home equipment for patient to be discharged despite Dr. Allena KatzPatel and nursing encouraging patient to stay.

## 2016-07-15 ENCOUNTER — Inpatient Hospital Stay
Admission: EM | Admit: 2016-07-15 | Discharge: 2016-08-09 | DRG: 974 | Disposition: E | Payer: Medicaid Other | Attending: Internal Medicine | Admitting: Internal Medicine

## 2016-07-15 ENCOUNTER — Emergency Department: Payer: Medicaid Other

## 2016-07-15 ENCOUNTER — Encounter: Payer: Self-pay | Admitting: *Deleted

## 2016-07-15 DIAGNOSIS — Z9119 Patient's noncompliance with other medical treatment and regimen: Secondary | ICD-10-CM

## 2016-07-15 DIAGNOSIS — Z86711 Personal history of pulmonary embolism: Secondary | ICD-10-CM

## 2016-07-15 DIAGNOSIS — I469 Cardiac arrest, cause unspecified: Secondary | ICD-10-CM | POA: Diagnosis not present

## 2016-07-15 DIAGNOSIS — K7689 Other specified diseases of liver: Secondary | ICD-10-CM | POA: Diagnosis present

## 2016-07-15 DIAGNOSIS — R579 Shock, unspecified: Secondary | ICD-10-CM | POA: Diagnosis not present

## 2016-07-15 DIAGNOSIS — Z66 Do not resuscitate: Secondary | ICD-10-CM | POA: Diagnosis present

## 2016-07-15 DIAGNOSIS — J189 Pneumonia, unspecified organism: Secondary | ICD-10-CM | POA: Diagnosis present

## 2016-07-15 DIAGNOSIS — Z79899 Other long term (current) drug therapy: Secondary | ICD-10-CM | POA: Diagnosis not present

## 2016-07-15 DIAGNOSIS — N179 Acute kidney failure, unspecified: Secondary | ICD-10-CM | POA: Diagnosis not present

## 2016-07-15 DIAGNOSIS — F1420 Cocaine dependence, uncomplicated: Secondary | ICD-10-CM | POA: Diagnosis present

## 2016-07-15 DIAGNOSIS — G931 Anoxic brain damage, not elsewhere classified: Secondary | ICD-10-CM | POA: Diagnosis not present

## 2016-07-15 DIAGNOSIS — B370 Candidal stomatitis: Secondary | ICD-10-CM | POA: Diagnosis present

## 2016-07-15 DIAGNOSIS — Z6838 Body mass index (BMI) 38.0-38.9, adult: Secondary | ICD-10-CM | POA: Diagnosis not present

## 2016-07-15 DIAGNOSIS — B20 Human immunodeficiency virus [HIV] disease: Secondary | ICD-10-CM | POA: Diagnosis present

## 2016-07-15 DIAGNOSIS — R34 Anuria and oliguria: Secondary | ICD-10-CM | POA: Diagnosis not present

## 2016-07-15 DIAGNOSIS — B1920 Unspecified viral hepatitis C without hepatic coma: Secondary | ICD-10-CM | POA: Diagnosis present

## 2016-07-15 DIAGNOSIS — J440 Chronic obstructive pulmonary disease with acute lower respiratory infection: Secondary | ICD-10-CM | POA: Diagnosis present

## 2016-07-15 DIAGNOSIS — R0602 Shortness of breath: Secondary | ICD-10-CM

## 2016-07-15 DIAGNOSIS — F329 Major depressive disorder, single episode, unspecified: Secondary | ICD-10-CM | POA: Diagnosis present

## 2016-07-15 DIAGNOSIS — Z4659 Encounter for fitting and adjustment of other gastrointestinal appliance and device: Secondary | ICD-10-CM

## 2016-07-15 DIAGNOSIS — R17 Unspecified jaundice: Secondary | ICD-10-CM

## 2016-07-15 DIAGNOSIS — E872 Acidosis: Secondary | ICD-10-CM | POA: Diagnosis present

## 2016-07-15 DIAGNOSIS — K219 Gastro-esophageal reflux disease without esophagitis: Secondary | ICD-10-CM | POA: Diagnosis present

## 2016-07-15 DIAGNOSIS — R200 Anesthesia of skin: Secondary | ICD-10-CM

## 2016-07-15 DIAGNOSIS — R4020 Unspecified coma: Secondary | ICD-10-CM | POA: Diagnosis not present

## 2016-07-15 DIAGNOSIS — I361 Nonrheumatic tricuspid (valve) insufficiency: Secondary | ICD-10-CM | POA: Diagnosis not present

## 2016-07-15 DIAGNOSIS — B3789 Other sites of candidiasis: Secondary | ICD-10-CM | POA: Diagnosis present

## 2016-07-15 DIAGNOSIS — D62 Acute posthemorrhagic anemia: Secondary | ICD-10-CM | POA: Diagnosis not present

## 2016-07-15 DIAGNOSIS — E222 Syndrome of inappropriate secretion of antidiuretic hormone: Secondary | ICD-10-CM | POA: Diagnosis present

## 2016-07-15 DIAGNOSIS — Z5309 Procedure and treatment not carried out because of other contraindication: Secondary | ICD-10-CM

## 2016-07-15 DIAGNOSIS — E669 Obesity, unspecified: Secondary | ICD-10-CM | POA: Diagnosis present

## 2016-07-15 DIAGNOSIS — R918 Other nonspecific abnormal finding of lung field: Secondary | ICD-10-CM

## 2016-07-15 DIAGNOSIS — I10 Essential (primary) hypertension: Secondary | ICD-10-CM | POA: Diagnosis present

## 2016-07-15 DIAGNOSIS — J9600 Acute respiratory failure, unspecified whether with hypoxia or hypercapnia: Secondary | ICD-10-CM | POA: Diagnosis not present

## 2016-07-15 DIAGNOSIS — R0600 Dyspnea, unspecified: Secondary | ICD-10-CM | POA: Diagnosis not present

## 2016-07-15 DIAGNOSIS — Y95 Nosocomial condition: Secondary | ICD-10-CM | POA: Diagnosis present

## 2016-07-15 DIAGNOSIS — E876 Hypokalemia: Secondary | ICD-10-CM | POA: Diagnosis present

## 2016-07-15 DIAGNOSIS — Z789 Other specified health status: Secondary | ICD-10-CM

## 2016-07-15 DIAGNOSIS — Z8701 Personal history of pneumonia (recurrent): Secondary | ICD-10-CM

## 2016-07-15 DIAGNOSIS — I4510 Unspecified right bundle-branch block: Secondary | ICD-10-CM | POA: Diagnosis present

## 2016-07-15 DIAGNOSIS — R739 Hyperglycemia, unspecified: Secondary | ICD-10-CM | POA: Diagnosis present

## 2016-07-15 DIAGNOSIS — J9601 Acute respiratory failure with hypoxia: Secondary | ICD-10-CM | POA: Diagnosis present

## 2016-07-15 DIAGNOSIS — Z21 Asymptomatic human immunodeficiency virus [HIV] infection status: Secondary | ICD-10-CM

## 2016-07-15 DIAGNOSIS — Z978 Presence of other specified devices: Secondary | ICD-10-CM

## 2016-07-15 DIAGNOSIS — R410 Disorientation, unspecified: Secondary | ICD-10-CM

## 2016-07-15 LAB — HEPATIC FUNCTION PANEL
ALK PHOS: 70 U/L (ref 38–126)
ALT: 100 U/L — AB (ref 14–54)
AST: 96 U/L — ABNORMAL HIGH (ref 15–41)
Albumin: 3.3 g/dL — ABNORMAL LOW (ref 3.5–5.0)
BILIRUBIN DIRECT: 1.4 mg/dL — AB (ref 0.1–0.5)
BILIRUBIN INDIRECT: 2.6 mg/dL — AB (ref 0.3–0.9)
BILIRUBIN TOTAL: 4 mg/dL — AB (ref 0.3–1.2)
Total Protein: 7.8 g/dL (ref 6.5–8.1)

## 2016-07-15 LAB — BASIC METABOLIC PANEL
ANION GAP: 11 (ref 5–15)
ANION GAP: 10 (ref 5–15)
BUN: 24 mg/dL — ABNORMAL HIGH (ref 6–20)
BUN: 24 mg/dL — ABNORMAL HIGH (ref 6–20)
CALCIUM: 8.8 mg/dL — AB (ref 8.9–10.3)
CALCIUM: 8.4 mg/dL — AB (ref 8.9–10.3)
CO2: 24 mmol/L (ref 22–32)
CO2: 27 mmol/L (ref 22–32)
Chloride: 87 mmol/L — ABNORMAL LOW (ref 101–111)
Chloride: 86 mmol/L — ABNORMAL LOW (ref 101–111)
Creatinine, Ser: 1.07 mg/dL — ABNORMAL HIGH (ref 0.44–1.00)
Creatinine, Ser: 1.16 mg/dL — ABNORMAL HIGH (ref 0.44–1.00)
GFR, EST AFRICAN AMERICAN: 57 mL/min — AB (ref >60)
GFR, EST NON AFRICAN AMERICAN: 54 mL/min — AB (ref >60)
GFR, EST NON AFRICAN AMERICAN: 49 mL/min — AB (ref >60)
GLUCOSE: 132 mg/dL — AB (ref 65–99)
Glucose, Bld: 136 mg/dL — ABNORMAL HIGH (ref 65–99)
POTASSIUM: 3.5 mmol/L (ref 3.5–5.1)
POTASSIUM: 3 mmol/L — AB (ref 3.5–5.1)
SODIUM: 122 mmol/L — AB (ref 135–145)
Sodium: 123 mmol/L — ABNORMAL LOW (ref 135–145)

## 2016-07-15 LAB — CBC WITH DIFFERENTIAL/PLATELET
BASOS ABS: 0 10*3/uL (ref 0–0.1)
BASOS PCT: 0 %
EOS PCT: 0 %
Eosinophils Absolute: 0 10*3/uL (ref 0–0.7)
HCT: 43.8 % (ref 35.0–47.0)
Hemoglobin: 15.1 g/dL (ref 12.0–16.0)
Lymphocytes Relative: 15 %
Lymphs Abs: 1.4 10*3/uL (ref 1.0–3.6)
MCH: 31.4 pg (ref 26.0–34.0)
MCHC: 34.4 g/dL (ref 32.0–36.0)
MCV: 91.2 fL (ref 80.0–100.0)
MONO ABS: 1.5 10*3/uL — AB (ref 0.2–0.9)
Monocytes Relative: 17 %
NEUTROS ABS: 6.2 10*3/uL (ref 1.4–6.5)
Neutrophils Relative %: 68 %
PLATELETS: 218 10*3/uL (ref 150–440)
RBC: 4.8 MIL/uL (ref 3.80–5.20)
RDW: 14.9 % — AB (ref 11.5–14.5)
WBC: 9.1 10*3/uL (ref 3.6–11.0)

## 2016-07-15 LAB — BRAIN NATRIURETIC PEPTIDE: B NATRIURETIC PEPTIDE 5: 17 pg/mL (ref 0.0–100.0)

## 2016-07-15 LAB — TROPONIN I

## 2016-07-15 MED ORDER — SULFAMETHOXAZOLE-TRIMETHOPRIM 800-160 MG PO TABS
1.0000 | ORAL_TABLET | Freq: Once | ORAL | Status: AC
  Administered 2016-07-15: 1 via ORAL
  Filled 2016-07-15: qty 1

## 2016-07-15 MED ORDER — DEXTROSE 5 % IV SOLN
2.0000 g | Freq: Once | INTRAVENOUS | Status: AC
  Administered 2016-07-15: 2 g via INTRAVENOUS
  Filled 2016-07-15: qty 2

## 2016-07-15 MED ORDER — DOLUTEGRAVIR SODIUM 50 MG PO TABS
50.0000 mg | ORAL_TABLET | Freq: Every day | ORAL | Status: DC
  Administered 2016-07-16: 10:00:00 50 mg via ORAL
  Filled 2016-07-15: qty 1

## 2016-07-15 MED ORDER — BISACODYL 5 MG PO TBEC: 5.0000 mg | DELAYED_RELEASE_TABLET | Freq: Every day | ORAL | Status: DC | PRN

## 2016-07-15 MED ORDER — ONDANSETRON HCL 4 MG PO TABS: 4.0000 mg | ORAL_TABLET | Freq: Four times a day (QID) | ORAL | Status: DC | PRN

## 2016-07-15 MED ORDER — VANCOMYCIN HCL IN DEXTROSE 1-5 GM/200ML-% IV SOLN
1000.0000 mg | INTRAVENOUS | Status: DC
  Administered 2016-07-15 – 2016-07-16 (×2): 1000 mg via INTRAVENOUS
  Filled 2016-07-15 (×3): qty 200

## 2016-07-15 MED ORDER — QUETIAPINE FUMARATE 25 MG PO TABS
25.0000 mg | ORAL_TABLET | Freq: Every day | ORAL | Status: DC
  Administered 2016-07-15 – 2016-07-16 (×2): 25 mg via ORAL
  Filled 2016-07-15 (×2): qty 1

## 2016-07-15 MED ORDER — RAMIPRIL 2.5 MG PO CAPS
10.0000 mg | ORAL_CAPSULE | Freq: Every evening | ORAL | Status: DC
  Administered 2016-07-15 – 2016-07-16 (×2): 10 mg via ORAL
  Filled 2016-07-15 (×2): qty 1

## 2016-07-15 MED ORDER — IPRATROPIUM-ALBUTEROL 0.5-2.5 (3) MG/3ML IN SOLN
3.0000 mL | Freq: Once | RESPIRATORY_TRACT | Status: AC
  Administered 2016-07-15: 3 mL via RESPIRATORY_TRACT
  Filled 2016-07-15: qty 3

## 2016-07-15 MED ORDER — HEPARIN SODIUM (PORCINE) 5000 UNIT/ML IJ SOLN
5000.0000 [IU] | Freq: Three times a day (TID) | INTRAMUSCULAR | Status: DC
  Administered 2016-07-15 – 2016-07-18 (×8): 5000 [IU] via SUBCUTANEOUS
  Filled 2016-07-15 (×9): qty 1

## 2016-07-15 MED ORDER — PREDNISONE 20 MG PO TABS
40.0000 mg | ORAL_TABLET | Freq: Two times a day (BID) | ORAL | Status: DC
  Administered 2016-07-16 – 2016-07-17 (×3): 40 mg via ORAL
  Filled 2016-07-15 (×3): qty 2

## 2016-07-15 MED ORDER — SULFAMETHOXAZOLE-TRIMETHOPRIM 400-80 MG PO TABS
1.0000 | ORAL_TABLET | Freq: Once | ORAL | Status: DC
  Filled 2016-07-15: qty 1

## 2016-07-15 MED ORDER — SULFAMETHOXAZOLE-TRIMETHOPRIM 800-160 MG PO TABS
1.0000 | ORAL_TABLET | Freq: Once | ORAL | Status: AC
Start: 2016-07-15 — End: 2016-07-15
  Administered 2016-07-15: 1 via ORAL
  Filled 2016-07-15: qty 1

## 2016-07-15 MED ORDER — AMITRIPTYLINE HCL 50 MG PO TABS
25.0000 mg | ORAL_TABLET | Freq: Every day | ORAL | Status: DC
  Administered 2016-07-15 – 2016-07-16 (×2): 25 mg via ORAL
  Filled 2016-07-15: qty 0.5
  Filled 2016-07-15: qty 1
  Filled 2016-07-15: qty 0.5

## 2016-07-15 MED ORDER — ONDANSETRON HCL 4 MG/2ML IJ SOLN
4.0000 mg | Freq: Four times a day (QID) | INTRAMUSCULAR | Status: DC | PRN
  Administered 2016-07-17: 17:00:00 4 mg via INTRAVENOUS
  Filled 2016-07-15: qty 2

## 2016-07-15 MED ORDER — VANCOMYCIN HCL IN DEXTROSE 1-5 GM/200ML-% IV SOLN
1000.0000 mg | Freq: Once | INTRAVENOUS | Status: AC
  Administered 2016-07-15: 1000 mg via INTRAVENOUS
  Filled 2016-07-15: qty 200

## 2016-07-15 MED ORDER — SODIUM CHLORIDE 0.9 % IV BOLUS (SEPSIS)
500.0000 mL | Freq: Once | INTRAVENOUS | Status: AC
  Administered 2016-07-15: 500 mL via INTRAVENOUS

## 2016-07-15 MED ORDER — IOPAMIDOL (ISOVUE-300) INJECTION 61%
100.0000 mL | Freq: Once | INTRAVENOUS | Status: AC | PRN
  Administered 2016-07-15: 100 mL via INTRAVENOUS

## 2016-07-15 MED ORDER — DEXTROSE 5 % IV SOLN
2.0000 g | Freq: Two times a day (BID) | INTRAVENOUS | Status: DC
  Administered 2016-07-16 – 2016-07-17 (×3): 2 g via INTRAVENOUS
  Filled 2016-07-15 (×4): qty 2

## 2016-07-15 MED ORDER — ACETAMINOPHEN 325 MG PO TABS: 650.0000 mg | ORAL_TABLET | Freq: Four times a day (QID) | ORAL | Status: DC | PRN

## 2016-07-15 MED ORDER — TRAZODONE HCL 50 MG PO TABS: 25.0000 mg | ORAL_TABLET | Freq: Every evening | ORAL | Status: DC | PRN

## 2016-07-15 MED ORDER — DOCUSATE SODIUM 100 MG PO CAPS
100.0000 mg | ORAL_CAPSULE | Freq: Two times a day (BID) | ORAL | Status: DC
  Administered 2016-07-16: 10:00:00 100 mg via ORAL
  Filled 2016-07-15 (×2): qty 1

## 2016-07-15 MED ORDER — ACETAMINOPHEN 650 MG RE SUPP: 650.0000 mg | Freq: Four times a day (QID) | RECTAL | Status: DC | PRN

## 2016-07-15 MED ORDER — SODIUM CHLORIDE 0.9 % IV SOLN
INTRAVENOUS | Status: DC
  Administered 2016-07-15 – 2016-07-16 (×2): via INTRAVENOUS

## 2016-07-15 MED ORDER — SULFAMETHOXAZOLE-TRIMETHOPRIM 800-160 MG PO TABS: 2.0000 | ORAL_TABLET | Freq: Three times a day (TID) | ORAL | Status: DC

## 2016-07-15 MED ORDER — IPRATROPIUM-ALBUTEROL 0.5-2.5 (3) MG/3ML IN SOLN
3.0000 mL | Freq: Three times a day (TID) | RESPIRATORY_TRACT | Status: DC
  Administered 2016-07-16 – 2016-07-17 (×5): 3 mL via RESPIRATORY_TRACT
  Filled 2016-07-15 (×5): qty 3

## 2016-07-15 MED ORDER — DARUNAVIR-COBICISTAT 800-150 MG PO TABS
1.0000 | ORAL_TABLET | Freq: Every day | ORAL | Status: DC
  Administered 2016-07-16: 1 via ORAL
  Filled 2016-07-15 (×3): qty 1

## 2016-07-15 NOTE — ED Notes (Signed)
Hospitalist, Dr Luberta MutterKonidena in to see pt for admission

## 2016-07-15 NOTE — ED Notes (Signed)
EDP at bedside  

## 2016-07-15 NOTE — ED Triage Notes (Signed)
Pt arrives via EMS from home after her daughter called for SOB, states she left AMA from the hospital on Monday with pneumonia, was discharged with o2 and abx and steroids, pt has been wearing o2 at convince, arrives tachypenic, states RLQ abd pain, spitting up, awake and alert, 88% on RA,placed on 3L [place]

## 2016-07-15 NOTE — ED Notes (Signed)
Patient transported to CT 

## 2016-07-15 NOTE — H&P (Signed)
[name] NAME: [name]    MR#:  [id number]  DATE OF BIRTH:  15-Jan-1951  DATE OF ADMISSION:  07/23/2016  PRIMARY CARE PHYSICIAN: [facility] [place]   REQUESTING/REFERRING PHYSICIAN: Merlyn Lot  CHIEF COMPLAINT: Shortness of breath    Chief Complaint  Patient presents with  . Shortness of Breath    HISTORY OF PRESENT ILLNESS:  [name]  is a 66 y.o. female with a known history of HIV on antiretrovirals, who was discharged from hospital 2 days ago for treating her with pneumonia and possible PCP discharged with Bactrim, steroids comes back today with worsening shortness of breath, cough, abdominal pain. Patient was discharged on fourth of June with Bactrim, prednisone, oral antibiotics. Plan is to continue steroids for total of 21 days and also 21 days Bactrim. Patient now requiring 2 L of oxygen to keep saturation 96% and she also looks confused. The M found to be 121, chest x-ray showed worsening pneumonia.  PAST MEDICAL HISTORY:   Past Medical History:  Diagnosis Date  . HIV (human immunodeficiency virus infection) ([place])   . Hypertension     PAST SURGICAL HISTOIRY:  History reviewed. No pertinent surgical history.  SOCIAL HISTORY:   Social History  Substance Use Topics  . Smoking status: Never Smoker  . Smokeless tobacco: Never Used  . Alcohol use Yes    FAMILY HISTORY:   Family History  Problem Relation Age of Onset  . Adopted: Yes    DRUG ALLERGIES:  No Known Allergies  REVIEW OF SYSTEMS:  CONSTITUTIONAL: No fever, fatigue or weakness.  EYES: No blurred or double vision.  EARS, NOSE, AND THROAT: No tinnitus or ear pain.  RESPIRATORY: Cough, shortness of breath.s.  CARDIOVASCULAR: No chest pain, orthopnea, edema.  GASTROINTESTINAL: No nausea, vomiting, diarrhea or abdominal pain.  GENITOURINARY: No dysuria, hematuria.  ENDOCRINE: No polyuria, nocturia,   HEMATOLOGY: No anemia, easy bruising or bleeding SKIN: No rash or lesion. MUSCULOSKELETAL: No joint pain or arthritis.   NEUROLOGIC: No tingling, numbness, weakness.  PSYCHIATRY: No anxiety or depression.   MEDICATIONS AT HOME:   Prior to Admission medications   Medication Sig Start Date End Date Taking? Authorizing Provider  amitriptyline (ELAVIL) 25 MG tablet Take 25 mg by mouth at bedtime.   Yes [provider]  cefUROXime (CEFTIN) 500 MG tablet Take 1 tablet (500 mg total) by mouth 2 (two) times daily with a meal. 07/13/16 07/20/16 Yes Dustin Flock, MD  darunavir-cobicistat (PREZCOBIX) 800-150 MG tablet Take 1 tablet by mouth daily with breakfast. Swallow whole. Do NOT crush, break or chew tablets. Take with food.   Yes [provider]  dolutegravir (TIVICAY) 50 MG tablet Take 50 mg by mouth daily.   Yes [provider]  ipratropium-albuterol (DUONEB) 0.5-2.5 (3) MG/3ML SOLN Take 3 mLs by nebulization 3 (three) times daily. 07/13/16  Yes Dustin Flock, MD  predniSONE (DELTASONE) 20 MG tablet Take 2 tablets (40 mg total) by mouth 2 (two) times daily with a meal. 07/13/16  Yes Dustin Flock, MD  QUEtiapine (SEROQUEL) 25 MG tablet Take 1 tablet (25 mg total) by mouth at bedtime. 07/13/16  Yes Dustin Flock, MD  ramipril (ALTACE) 10 MG capsule Take 10 mg by mouth every evening.   Yes [provider]  sulfamethoxazole-trimethoprim (BACTRIM DS,SEPTRA DS) 800-160 MG tablet Take 2 tablets by mouth 3 (three) times daily. 07/13/16 08/01/16 Yes Dustin Flock, MD      VITAL  SIGNS:  Blood pressure (!) 148/88, pulse 93, temperature 98 F (36.7 C), temperature source Oral, resp. rate 17, height 5' 1"  (1.549 m), weight 96.2 kg (212 lb), SpO2 96 %.  PHYSICAL EXAMINATION:  GENERAL:  66 y.o.-year-old patient lying in the bed with no acute distress. Appears slightly confused but able to reorient quickly.   ER  nurse told me that she was spitting on the floor earlier  this shift.  EYES: Pupils equal, round, reactive to light and accommodation. No scleral icterus. Extraocular muscles intact.  HEENT: Head atraumatic, normocephalic. Oropharynx and nasopharynx clear.  NECK:  Supple, no jugular venous distention. No thyroid enlargement, no tenderness.  LUNGS Decreased breath sounds bilaterally.  CARDIOVASCULAR: S1, S2 normal. No murmurs, rubs, or gallops.  ABDOMEN: Soft, nontender, nondistended. Bowel sounds present. No organomegaly or mass.  EXTREMITIES: No pedal edema, cyanosis, or clubbing.  NEUROLOGIC: Cranial nerves II through XII are intact. Muscle strength 5/5 in all extremities. Sensation intact. Gait not checked.  PSYCHIATRIC: The patient is alert and oriented x 3.  SKIN: No obvious rash, lesion, or ulcer.   LABORATORY PANEL:   CBC  Recent Labs Lab 08/05/2016 1505  WBC 9.1  HGB 15.1  HCT 43.8  PLT 218   ------------------------------------------------------------------------------------------------------------------  Chemistries   Recent Labs Lab 07/16/2016 1505  NA 122*  K 3.5  CL 87*  CO2 24  GLUCOSE 136*  BUN 24*  CREATININE 1.07*  CALCIUM 8.8*  AST 96*  ALT 100*  ALKPHOS 70  BILITOT 4.0*   ------------------------------------------------------------------------------------------------------------------  Cardiac Enzymes  Recent Labs Lab 07/24/2016 1505  TROPONINI <0.03   ------------------------------------------------------------------------------------------------------------------  RADIOLOGY:  Dg Chest 2 View  Result Date: 07/31/2016 CLINICAL DATA:  Recently hospitalized with pneumonia. Patient returns with shortness of breath. History of hypertension and HIV. EXAM: CHEST  2 VIEW COMPARISON:  Chest x-ray of Jul 09, 2016 FINDINGS: The lungs are reasonably well inflated. There is a markings are increased. There is are areas of atelectasis or linear infiltrate at the lung bases similar to that seen on May 31st. There is no  large pleural effusion. The cardiac silhouette is mildly enlarged. There is calcification in the wall of the thoracic aorta. There is multilevel degenerative disc disease of the thoracic spine. IMPRESSION: Bilateral interstitial pneumonia greatest at the lung bases. No CHF. The appearance of the chest has deteriorated since the PA and lateral chest x-ray of Jul 04, 2016 and is slightly worse than on the the Jul 09, 2016 study. Electronically Signed   By: David  Martinique M.D.   On: 07/20/2016 16:07   Ct Abdomen Pelvis W Contrast  Result Date: 08/02/2016 CLINICAL DATA:  Acute right lower quadrant pain. EXAM: CT ABDOMEN AND PELVIS WITH CONTRAST TECHNIQUE: Multidetector CT imaging of the abdomen and pelvis was performed using the standard protocol following bolus administration of intravenous contrast. CONTRAST:  152m ISOVUE-300 IOPAMIDOL (ISOVUE-300) INJECTION 61% COMPARISON:  01/24/2013 FINDINGS: Lower chest: Cardiomegaly. Subsegmental atelectasis or scarring at the bases. Hepatobiliary: No focal liver abnormality.Cholelithiasis. No evidence of acute cholecystitis or biliary obstruction Pancreas: Unremarkable. Spleen: Unremarkable. Adrenals/Urinary Tract: Negative adrenals. No hydronephrosis or stone. 18 mm simple appearing left renal cyst. Unremarkable bladder. Stomach/Bowel: No obstruction. Moderate colonic gas and stool. No pericecal or other inflammation. Vascular/Lymphatic: Narrow appearance of the main portal vein that is chronic. 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 No mass or adenopathy. Reproductive:Hysterectomy and possible right oophorectomy. There is a left adnexal cyst measuring 4.1 cm. Enhancing tissue along  the left aspect of the wall could be residual ovary. This cyst had internal complex material in 2014 pelvic sonography. This mass was also evaluated at [facility] per sonographic report May 2011, when complexity was also noted at that time. Other: No ascites or  pneumoperitoneum. Musculoskeletal: Spondylosis and facet arthropathy. No acute or aggressive finding. IMPRESSION: 1. No acute finding. 2. Cholelithiasis without signs of cholecystitis. 3. 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 4. Chronic left adnexal cyst measuring up to 4.1 cm, size stable from 2009. Recommend yearly pelvic sonography. Electronically Signed   By: Monte Fantasia M.D.   On: 07/18/2016 16:58   US Abdomen Limited Ruq  Result Date: 07/28/2016 CLINICAL DATA:  Hyperbilirubinemia.  Known cholelithiasis. EXAM: ULTRASOUND ABDOMEN LIMITED RIGHT UPPER QUADRANT COMPARISON:  CT abdomen pelvis performed earlier today, 01/24/2013 and earlier. Right upper quadrant abdominal ultrasound 02/01/2013, 01/24/2013 and earlier. FINDINGS: Gallbladder: Multiple echogenic gallstones, the largest on the order of 10 mm. No gallbladder wall thickening or pericholecystic fluid. Negative sonographic Murphy's sign according to the ultrasound technologist. Common bile duct: Diameter: Approximately 5 mm proximally. No visible stones in the proximal duct. The mid and distal duct are obscured by duodenal bowel gas. Liver: Scattered geographic areas of increased parenchymal echotexture. No parenchymal masses. Patent though narrowed main portal vein with hepatopetal flow (as noted on the earlier CT). IMPRESSION: 1. Cholelithiasis without sonographic evidence of acute cholecystitis. 2. No evidence of biliary ductal dilation. 3. Scattered areas of focal hepatic steatosis. No hepatic masses. Narrowed main portal vein with hepatopetal flow (as noted on the earlier CT). Electronically Signed   By: Evangeline Dakin M.D.   On: 07/29/2016 19:13    EKG:   Orders placed or performed during the hospital encounter of 08/06/2016  . ED EKG  . ED EKG    IMPRESSION AND PLAN:   1 acute respiratory failure: Patient has recurrent acute respiratory failure likely needs longer treatment in the hospital  this time, started on vancomycin, cefepime for HCAP. continue prednisone, Bactrim for possible PCP as recommended by ID physician during the last discharge.  #2. Acute on chronic hyponatremia and confusion: Start gentle hydration, check met B every 6 hours. #3 poorly controlled HIV recent CD4 count 203 as per ID note: Continue treatment with antiretrovirals, reconsult ID also this time.  The abdominal pain likely due to cough: CT abdomen nondiagnostic.  All the records are reviewed and case discussed with ED provider. Management plans discussed with the patient, family and they are in agreement.  CODE STATUS: Full code  TOTAL TIME TAKING CARE OF THIS PATIENT: 55 minutes.    Epifanio Lesches M.D on 07/17/2016 at 7:55 PM  Between 7am to 6pm - Pager - [phone]  After 6pm go to [email or web address] - password [email or web address] [facility]  Office  [phone]  CC: Primary care physician; [facility]  Note: This dictation was prepared with Dragon dictation along with smaller phrase technology. Any transcriptional errors that result from this process are unintentional.

## 2016-07-15 NOTE — ED Provider Notes (Signed)
[facility] Emergency Department Provider Note    First MD Initiated Contact with Patient 07/16/2016 1502     (approximate)  I have reviewed the triage vital signs and the nursing notes.   HISTORY  Chief Complaint Shortness of Breath    HPI [name] is a 66 y.o. female history of HIV and recent admission to hospital for cough with evidence of pneumonia and O2 requirement presents via EMS from home with reported history by family of having brief episode of shortness of breath that resolved. Patient is sent home on 3 L nasal cannula and arrives satting well on 3 L nasal cannula. She does not appear to be in any acute distress.  She is describing diffuse abdominal pain. Denies any chest pain. No nausea or vomiting.   Past Medical History:  Diagnosis Date  . HIV (human immunodeficiency virus infection) ([facility])   . Hypertension    Family History  Problem Relation Age of Onset  . Adopted: Yes   History reviewed. No pertinent surgical history. Patient Active Problem List   Diagnosis Date Noted  . Acute respiratory failure with hypoxia ([facility]) 07/04/2016      Prior to Admission medications   Medication Sig Start Date End Date Taking? Authorizing Provider  amitriptyline (ELAVIL) 25 MG tablet Take 25 mg by mouth at bedtime.   Yes [provider]  cefUROXime (CEFTIN) 500 MG tablet Take 1 tablet (500 mg total) by mouth 2 (two) times daily with a meal. 07/13/16 07/20/16 Yes Auburn Bilberry, MD  darunavir-cobicistat (PREZCOBIX) 800-150 MG tablet Take 1 tablet by mouth daily with breakfast. Swallow whole. Do NOT crush, break or chew tablets. Take with food.   Yes [provider]  dolutegravir (TIVICAY) 50 MG tablet Take 50 mg by mouth daily.   Yes [provider]  ipratropium-albuterol (DUONEB) 0.5-2.5 (3) MG/3ML SOLN Take 3 mLs by nebulization 3 (three) times daily. 07/13/16  Yes Auburn Bilberry, MD  predniSONE (DELTASONE) 20 MG tablet  Take 2 tablets (40 mg total) by mouth 2 (two) times daily with a meal. 07/13/16  Yes Auburn Bilberry, MD  QUEtiapine (SEROQUEL) 25 MG tablet Take 1 tablet (25 mg total) by mouth at bedtime. 07/13/16  Yes Auburn Bilberry, MD  ramipril (ALTACE) 10 MG capsule Take 10 mg by mouth every evening.   Yes [provider]  sulfamethoxazole-trimethoprim (BACTRIM DS,SEPTRA DS) 800-160 MG tablet Take 2 tablets by mouth 3 (three) times daily. 07/13/16 08/01/16 Yes Auburn Bilberry, MD    Allergies Patient has no known allergies.    Social History Social History  Substance Use Topics  . Smoking status: Never Smoker  . Smokeless tobacco: Never Used  . Alcohol use Yes    Review of Systems Patient denies headaches, rhinorrhea, blurry vision, numbness, shortness of breath, chest pain, edema, cough, abdominal pain, nausea, vomiting, diarrhea, dysuria, fevers, rashes or hallucinations unless otherwise stated above in HPI. ____________________________________________   PHYSICAL EXAM:  VITAL SIGNS: Vitals:   07/11/2016 1533 08/08/2016 1600  BP:    Pulse: 97 93  Resp:  17  Temp:      Constitutional: Alert but ill appearing, dyspneic but protecting her airway Eyes: Conjunctivae are normal.  Head: Atraumatic. Nose: No congestion/rhinnorhea. Mouth/Throat: Mucous membranes are moist.   Neck: No stridor. Painless ROM.  Cardiovascular: Normal rate, regular rhythm. Grossly normal heart sounds.  Good peripheral circulation. Respiratory: diffuse rhonchorus breathsounds with bibasillar crackels Gastrointestinal: Soft and nontender. No distention. No abdominal bruits. No CVA tenderness.  Musculoskeletal: No lower extremity tenderness nor edema.  No joint effusions. Neurologic:  Normal speech and language. No gross focal neurologic deficits are appreciated. No facial droop Skin:  Skin is warm, dry and intact. No rash noted. Psychiatric: Mood and affect are normal. Speech and behavior are  normal. ____________________________________________   LABS (all labs ordered are listed, but only abnormal results are displayed)  Results for orders placed or performed during the hospital encounter of 07/14/2016 (from the past 24 hour(s))  CBC with Differential/Platelet     Status: Abnormal   Collection Time: 07/11/2016  3:05 PM  Result Value Ref Range   WBC 9.1 3.6 - 11.0 K/uL   RBC 4.80 3.80 - 5.20 MIL/uL   Hemoglobin 15.1 12.0 - 16.0 g/dL   HCT 09.6 04.5 - 40.9 %   MCV 91.2 80.0 - 100.0 fL   MCH 31.4 26.0 - 34.0 pg   MCHC 34.4 32.0 - 36.0 g/dL   RDW 81.1 (H) 91.4 - 78.2 %   Platelets 218 150 - 440 K/uL   Neutrophils Relative % 68 %   Neutro Abs 6.2 1.4 - 6.5 K/uL   Lymphocytes Relative 15 %   Lymphs Abs 1.4 1.0 - 3.6 K/uL   Monocytes Relative 17 %   Monocytes Absolute 1.5 (H) 0.2 - 0.9 K/uL   Eosinophils Relative 0 %   Eosinophils Absolute 0.0 0 - 0.7 K/uL   Basophils Relative 0 %   Basophils Absolute 0.0 0 - 0.1 K/uL  Basic metabolic panel     Status: Abnormal   Collection Time: 07/27/2016  3:05 PM  Result Value Ref Range   Sodium 122 (L) 135 - 145 mmol/L   Potassium 3.5 3.5 - 5.1 mmol/L   Chloride 87 (L) 101 - 111 mmol/L   CO2 24 22 - 32 mmol/L   Glucose, Bld 136 (H) 65 - 99 mg/dL   BUN 24 (H) 6 - 20 mg/dL   Creatinine, Ser 9.56 (H) 0.44 - 1.00 mg/dL   Calcium 8.8 (L) 8.9 - 10.3 mg/dL   GFR calc non Af Amer 54 (L) >60 mL/min   GFR calc Af Amer >60 >60 mL/min   Anion gap 11 5 - 15  Troponin I     Status: None   Collection Time: 07/11/2016  3:05 PM  Result Value Ref Range   Troponin I <0.03 <0.03 ng/mL  Hepatic function panel     Status: Abnormal   Collection Time: 07/11/2016  3:05 PM  Result Value Ref Range   Total Protein 7.8 6.5 - 8.1 g/dL   Albumin 3.3 (L) 3.5 - 5.0 g/dL   AST 96 (H) 15 - 41 U/L   ALT 100 (H) 14 - 54 U/L   Alkaline Phosphatase 70 38 - 126 U/L   Total Bilirubin 4.0 (H) 0.3 - 1.2 mg/dL   Bilirubin, Direct 1.4 (H) 0.1 - 0.5 mg/dL   Indirect  Bilirubin 2.6 (H) 0.3 - 0.9 mg/dL  Brain natriuretic peptide     Status: None   Collection Time: 08/05/2016  5:31 PM  Result Value Ref Range   B Natriuretic Peptide 17.0 0.0 - 100.0 pg/mL   ____________________________________________  EKG My review and personal interpretation at Time: 15:04   Indication: sob  Rate: 95  Rhythm: sinus Axis: left Other: arm lead reversal, no STEMI, no significant changes as compared to EKG 07/04/16 ____________________________________________  RADIOLOGY  I personally reviewed all radiographic images ordered to evaluate for the above acute complaints and reviewed radiology  reports and findings.  These findings were personally discussed with the patient.  Please see medical record for radiology report.  ____________________________________________   PROCEDURES  Procedure(s) performed:  Procedures    Critical Care performed: no ____________________________________________   INITIAL IMPRESSION / ASSESSMENT AND PLAN / ED COURSE  Pertinent labs & imaging results that were available during my care of the patient were reviewed by me and considered in my medical decision making (see chart for details).  DDX: pna, sepsis, copd, chf, pcp, pneumonitis, colitis, appendicitis, perforations  [name] is a 66 y.o. who presents to the ED with chief complaint of shortness of breath that seems to have resolved the patient very ill-appearing with history of HIV and recent admission to the hospital with concern for PCP pneumonia. She did leave early from the hospital and states to have worsened since discharge. Does desaturate down to the mid 80s on room air. Does have shortness of breath but at least is not hypoxic on 3 L of nasal cannula. Based on her abdominal exam will order CT imaging to evaluate for any evidence of acute intra-abdominal pathology.  Will give nebulizers.  The patient will be placed on continuous pulse oximetry and telemetry for monitoring.   Laboratory evaluation will be sent to evaluate for the above complaints.     Clinical Course as of Jul 15 1949  Wed Jul 15, 2016  1643 Calcium: (!) 8.8 [PR]  1706 CT imaging of abdomen shows cholelithiasis but no evidence of cholecystitis. Based on her increasing bilirubin and elevated LFTs will order ultrasound to further characterize any evidence of obstructive process. Patient will be started on broad-spectrum antibiotics given worsening chest x-ray and concern for PCP pneumonia given her HIV status.  [PR]  1937 Spoke with Dr. Luberta MutterKonidena regarding the patient's presentation. She continues to require supplemental oxygen, appears unwell and is dyspneic. I do feel the patient will require admission for additional IV antibiotics and monitoring.  [PR]    Clinical Course User Index [PR] Willy Eddyobinson, Lucindia Lemley, MD     ____________________________________________   FINAL CLINICAL IMPRESSION(S) / ED DIAGNOSES  Final diagnoses:  Elevated bilirubin  HIV (human immunodeficiency virus infection) ([facility])  Shortness of breath      NEW MEDICATIONS STARTED DURING THIS VISIT:  New Prescriptions   No medications on file     Note:  This document was prepared using Dragon voice recognition software and may include unintentional dictation errors.    Willy Eddyobinson, Alleen Kehm, MD 08/08/2016 [phone]

## 2016-07-15 NOTE — ED Notes (Signed)
Pt uprite on stretcher in exam room with no distress noted, frequently spitting into an emesis bag; Dr Roxan Hockeyobinson in to speak with pt regarding admission into the hospital; pt voices understanding; pt denies any c/o at present

## 2016-07-15 NOTE — Progress Notes (Signed)
Pharmacy Antibiotic Note  Alisha ClapJuanita Davis is a 66 y.o. female admitted on 07/31/2016 with pneumonia/recent suspected pneumocystis PNA.  Pharmacy has been consulted for vancomycin, cefepime, SMX-TMP dosing. This HIV pt was recently admitted for possible pneumocystis PNA and left AMA on SMX-TMP DS 2 tablets TID. Plan is to continue SMX-TMP x 21 total days. Pt received vancomycin 1000 mg IV x 1 in ED  Plan: Begin cefepime 2 g IV q 12 hours  Begin SMX-TMP DS 2 tablets TID  Begin vancomycin 1000 mg IV q 18 hours (~6hour stacked dosing) with trough drawn prior to fourth dose. Cmin ss estimated to be 16.3.  PK: t1/2=13.3  ke = 0.052 Vd = 47 Pt is likely to accumulate vancomycin due to weight  Height: 5\' 1"  (154.9 cm) Weight: 212 lb (96.2 kg) IBW/kg (Calculated) : 47.8  Temp (24hrs), Avg:98 F (36.7 C), Min:98 F (36.7 C), Max:98 F (36.7 C)   Recent Labs Lab 07/09/16 0608 07/10/16 0608 07/11/16 0440 07/12/16 0520 07/13/16 0531 07/18/2016 1505  WBC 6.3  --   --   --   --  9.1  CREATININE 1.20* 1.17* 1.13* 1.22* 1.14* 1.07*    Estimated Creatinine Clearance: 57.1 mL/min (A) (by C-G formula based on SCr of 1.07 mg/dL (H)).    No Known Allergies  Antimicrobials this admission: SMX-TMP 6/6 >>  vanc 6/6 >>  Cefepime 6/6 >>  Dose adjustments this admission:  Microbiology results: 6/6 BCx: sent  UCx:    Sputum:    MRSA PCR:   Thank you for allowing pharmacy to be a part of this patient's care.  Horris LatinoHolly Victory Strollo, PharmD Pharmacy Resident 07/26/2016 8:21 PM

## 2016-07-16 ENCOUNTER — Inpatient Hospital Stay: Admit: 2016-07-16 | Payer: Medicaid Other

## 2016-07-16 ENCOUNTER — Inpatient Hospital Stay (HOSPITAL_COMMUNITY)
Admit: 2016-07-16 | Discharge: 2016-07-16 | Disposition: A | Discharge status: Home / Self Care | Payer: Medicaid Other | Attending: Internal Medicine | Admitting: Internal Medicine

## 2016-07-16 DIAGNOSIS — R0600 Dyspnea, unspecified: Secondary | ICD-10-CM

## 2016-07-16 LAB — CORTISOL: Cortisol, Plasma: 13.2 ug/dL

## 2016-07-16 LAB — BASIC METABOLIC PANEL
Anion gap: 10 (ref 5–15)
Anion gap: 9 (ref 5–15)
Anion gap: 10 (ref 5–15)
BUN: 23 mg/dL — AB (ref 6–20)
BUN: 22 mg/dL — AB (ref 6–20)
BUN: 19 mg/dL (ref 6–20)
CALCIUM: 8.4 mg/dL — AB (ref 8.9–10.3)
CALCIUM: 8.1 mg/dL — AB (ref 8.9–10.3)
CALCIUM: 8.5 mg/dL — AB (ref 8.9–10.3)
CHLORIDE: 91 mmol/L — AB (ref 101–111)
CHLORIDE: 88 mmol/L — AB (ref 101–111)
CO2: 25 mmol/L (ref 22–32)
CO2: 22 mmol/L (ref 22–32)
CO2: 25 mmol/L (ref 22–32)
CREATININE: 0.92 mg/dL (ref 0.44–1.00)
CREATININE: 0.81 mg/dL (ref 0.44–1.00)
Chloride: 86 mmol/L — ABNORMAL LOW (ref 101–111)
Creatinine, Ser: 1.1 mg/dL — ABNORMAL HIGH (ref 0.44–1.00)
GFR calc Af Amer: >60 mL/min (ref >60)
GFR calc Af Amer: >60 mL/min (ref >60)
GFR calc Af Amer: >60 mL/min (ref >60)
GFR calc non Af Amer: >60 mL/min (ref >60)
GFR, EST NON AFRICAN AMERICAN: 52 mL/min — AB (ref >60)
Glucose, Bld: 144 mg/dL — ABNORMAL HIGH (ref 65–99)
Glucose, Bld: 131 mg/dL — ABNORMAL HIGH (ref 65–99)
Glucose, Bld: 209 mg/dL — ABNORMAL HIGH (ref 65–99)
Potassium: 3.3 mmol/L — ABNORMAL LOW (ref 3.5–5.1)
Potassium: 3.4 mmol/L — ABNORMAL LOW (ref 3.5–5.1)
Potassium: 3.8 mmol/L (ref 3.5–5.1)
SODIUM: 121 mmol/L — AB (ref 135–145)
SODIUM: 122 mmol/L — AB (ref 135–145)
SODIUM: 123 mmol/L — AB (ref 135–145)

## 2016-07-16 LAB — BLOOD CULTURE ID PANEL (REFLEXED)
Acinetobacter baumannii: NOT DETECTED
CANDIDA ALBICANS: NOT DETECTED
CANDIDA GLABRATA: NOT DETECTED
CANDIDA TROPICALIS: NOT DETECTED
Candida krusei: NOT DETECTED
Candida parapsilosis: NOT DETECTED
ENTEROBACTER CLOACAE COMPLEX: NOT DETECTED
ENTEROBACTERIACEAE SPECIES: NOT DETECTED
Enterococcus species: NOT DETECTED
Escherichia coli: NOT DETECTED
Haemophilus influenzae: NOT DETECTED
KLEBSIELLA OXYTOCA: NOT DETECTED
Klebsiella pneumoniae: NOT DETECTED
Listeria monocytogenes: NOT DETECTED
Methicillin resistance: DETECTED — AB
NEISSERIA MENINGITIDIS: NOT DETECTED
Proteus species: NOT DETECTED
Pseudomonas aeruginosa: NOT DETECTED
STREPTOCOCCUS PNEUMONIAE: NOT DETECTED
STREPTOCOCCUS PYOGENES: NOT DETECTED
STREPTOCOCCUS SPECIES: NOT DETECTED
Serratia marcescens: NOT DETECTED
Staphylococcus aureus (BCID): NOT DETECTED
Staphylococcus species: DETECTED — AB
Streptococcus agalactiae: NOT DETECTED

## 2016-07-16 LAB — CBC
HCT: 43.4 % (ref 35.0–47.0)
Hemoglobin: 14.9 g/dL (ref 12.0–16.0)
MCH: 31.2 pg (ref 26.0–34.0)
MCHC: 34.2 g/dL (ref 32.0–36.0)
MCV: 91.3 fL (ref 80.0–100.0)
PLATELETS: 193 10*3/uL (ref 150–440)
RBC: 4.76 MIL/uL (ref 3.80–5.20)
RDW: 14.9 % — AB (ref 11.5–14.5)
WBC: 7.5 10*3/uL (ref 3.6–11.0)

## 2016-07-16 LAB — BRAIN NATRIURETIC PEPTIDE: B NATRIURETIC PEPTIDE 5: 15 pg/mL (ref 0.0–100.0)

## 2016-07-16 LAB — GLUCOSE, CAPILLARY
GLUCOSE-CAPILLARY: 119 mg/dL — AB (ref 65–99)
Glucose-Capillary: 128 mg/dL — ABNORMAL HIGH (ref 65–99)

## 2016-07-16 LAB — URINE DRUG SCREEN, QUALITATIVE (ARMC ONLY)
Amphetamines, Ur Screen: NOT DETECTED
BARBITURATES, UR SCREEN: NOT DETECTED
Benzodiazepine, Ur Scrn: NOT DETECTED
CANNABINOID 50 NG, UR ~~LOC~~: NOT DETECTED
Cocaine Metabolite,Ur ~~LOC~~: POSITIVE — AB
MDMA (Ecstasy)Ur Screen: NOT DETECTED
Methadone Scn, Ur: NOT DETECTED
Opiate, Ur Screen: NOT DETECTED
Phencyclidine (PCP) Ur S: NOT DETECTED
Tricyclic, Ur Screen: POSITIVE — AB

## 2016-07-16 LAB — OSMOLALITY, URINE: Osmolality, Ur: 524 mOsm/kg (ref 300–900)

## 2016-07-16 LAB — URIC ACID: Uric Acid, Serum: 2.9 mg/dL (ref 2.3–6.6)

## 2016-07-16 LAB — OSMOLALITY: OSMOLALITY: 268 mosm/kg — AB (ref 275–295)

## 2016-07-16 LAB — SODIUM, URINE, RANDOM: Sodium, Ur: 80 mmol/L

## 2016-07-16 LAB — TSH: TSH: 1.861 u[IU]/mL (ref 0.350–4.500)

## 2016-07-16 LAB — MAGNESIUM: Magnesium: 2.2 mg/dL (ref 1.7–2.4)

## 2016-07-16 LAB — SODIUM
SODIUM: 122 mmol/L — AB (ref 135–145)
Sodium: 124 mmol/L — ABNORMAL LOW (ref 135–145)

## 2016-07-16 MED ORDER — BICTEGRAVIR-EMTRICITAB-TENOFOV 50-200-25 MG PO TABS
1.0000 | ORAL_TABLET | Freq: Every day | ORAL | Status: DC
  Administered 2016-07-16: 10:00:00 1 via ORAL
  Filled 2016-07-16 (×3): qty 1

## 2016-07-16 MED ORDER — PREDNISONE 20 MG PO TABS: 40.0000 mg | ORAL_TABLET | Freq: Every day | ORAL | Status: DC

## 2016-07-16 MED ORDER — FUROSEMIDE 40 MG PO TABS
40.0000 mg | ORAL_TABLET | Freq: Every day | ORAL | Status: DC
  Administered 2016-07-16: 40 mg via ORAL
  Filled 2016-07-16: qty 1

## 2016-07-16 MED ORDER — FLUCONAZOLE IN SODIUM CHLORIDE 100-0.9 MG/50ML-% IV SOLN
100.0000 mg | Freq: Every day | INTRAVENOUS | Status: DC
  Administered 2016-07-16 – 2016-07-17 (×2): 100 mg via INTRAVENOUS
  Filled 2016-07-16 (×5): qty 50

## 2016-07-16 MED ORDER — SODIUM CHLORIDE 1 G PO TABS
1.0000 g | ORAL_TABLET | Freq: Two times a day (BID) | ORAL | Status: DC
  Administered 2016-07-16 – 2016-07-17 (×2): 1 g via ORAL
  Filled 2016-07-16 (×4): qty 1

## 2016-07-16 MED ORDER — PREDNISONE 20 MG PO TABS: 20.0000 mg | ORAL_TABLET | Freq: Every day | ORAL | Status: DC

## 2016-07-16 MED ORDER — SULFAMETHOXAZOLE-TRIMETHOPRIM 800-160 MG PO TABS
2.0000 | ORAL_TABLET | Freq: Three times a day (TID) | ORAL | Status: DC
  Administered 2016-07-16 – 2016-07-17 (×4): 2 via ORAL
  Filled 2016-07-16 (×5): qty 2

## 2016-07-16 MED ORDER — DOCUSATE SODIUM 50 MG/5ML PO LIQD
100.0000 mg | Freq: Two times a day (BID) | ORAL | Status: DC
  Administered 2016-07-16: 100 mg via ORAL
  Filled 2016-07-16: qty 10

## 2016-07-16 NOTE — Progress Notes (Signed)
PHARMACY - PHYSICIAN COMMUNICATION CRITICAL VALUE ALERT - BLOOD CULTURE IDENTIFICATION (BCID)  No results found for this or any previous visit.  07/16/16 15:55 Lab called to report GPC in 1 of 4 bottles, staph species, MecA detected.   Name of physician (or Provider) Contacted: Dr. Allena KatzPatel  Changes to prescribed antibiotics required: None - continue cefepime/vancomycin/bactrim.  Carola FrostNathan A Mattingly Fountaine, Pharm.D., BCPS Clinical Pharmacist 07/16/2016  4:00 PM

## 2016-07-16 NOTE — Consult Note (Signed)
[facility] Infectious Disease     Reason for Consult HIV, PNA    Referring Physician: Karlton Lemon Date of Admission:  07/23/2016   Active Problems:   Acute respiratory failure ([place])   HPI: [name] is a 66 y.o. female admitted with reports of abd pain and some SOB. She is a very poor historian and cannot really tell me why she returned to the ED after recent admission.  She has hx of long standing poorly controlled HIV due to noncompliance and development of resistance, with most recent CD4 87/12.4 % VL 200. She was recently restarted on a new ART therapy, several weeks prior to these labs.  She on admit had no fever, wbc 4.6, CXR with interstitial process. Prior admit 5/26 CT angio neg for PE, some scattered opacities.  She has no current O2 requirement. She also has progressive hyponatremia down to 121 but cr 0.81. She does report that she has started to drink some beer again but cannot tell me how many.  Utox is + cocaine and TCA.  Past Medical History:  Diagnosis Date  . HIV (human immunodeficiency virus infection) ([place])   . Hypertension    History reviewed. No pertinent surgical history. Social History  Substance Use Topics  . Smoking status: Never Smoker  . Smokeless tobacco: Never Used  . Alcohol use Yes   Family History  Problem Relation Age of Onset  . Adopted: Yes    Allergies: No Known Allergies  Current antibiotics: Antibiotics Given (last 72 hours)    Date/Time Action Medication Dose Rate   07/10/2016 1700 New Bag/Given   vancomycin (VANCOCIN) IVPB 1000 mg/200 mL premix 1,000 mg 200 mL/hr   07/28/2016 1700 Given   sulfamethoxazole-trimethoprim (BACTRIM DS,SEPTRA DS) 800-160 MG per tablet 1 tablet 1 tablet    08/02/2016 1843 New Bag/Given   ceFEPIme (MAXIPIME) 2 g in dextrose 5 % 50 mL IVPB 2 g 100 mL/hr   07/10/2016 2321 Given   sulfamethoxazole-trimethoprim (BACTRIM DS,SEPTRA DS) 800-160 MG per tablet 1 tablet 1 tablet    08/04/2016 2321 New Bag/Given    vancomycin (VANCOCIN) IVPB 1000 mg/200 mL premix 1,000 mg 200 mL/hr   07/16/16 0641 New Bag/Given   ceFEPIme (MAXIPIME) 2 g in dextrose 5 % 50 mL IVPB 2 g 100 mL/hr   07/16/16 0939 Given   sulfamethoxazole-trimethoprim (BACTRIM DS,SEPTRA DS) 800-160 MG per tablet 2 tablet 2 tablet    07/16/16 0941 Given   darunavir-cobicistat (PREZCOBIX) 800-150 MG per tablet 1 tablet 1 tablet    07/16/16 0942 Given   bictegravir-emtricitabine-tenofovir AF (BIKTARVY) 50-200-25 MG per tablet 1 tablet 1 tablet    07/16/16 0943 Given   dolutegravir (TIVICAY) tablet 50 mg 50 mg       MEDICATIONS: . amitriptyline  25 mg Oral QHS  . bictegravir-emtricitabine-tenofovir AF  1 tablet Oral Daily  . darunavir-cobicistat  1 tablet Oral Q breakfast  . docusate sodium  100 mg Oral BID  . dolutegravir  50 mg Oral Daily  . furosemide  40 mg Oral Daily  . heparin  5,000 Units Subcutaneous Q8H  . ipratropium-albuterol  3 mL Nebulization TID  . predniSONE  40 mg Oral BID WC  . QUEtiapine  25 mg Oral QHS  . ramipril  10 mg Oral QPM  . sodium chloride  1 g Oral BID WC  . sulfamethoxazole-trimethoprim  2 tablet Oral TID    Review of Systems - 11 systems reviewed and negative per HPI   OBJECTIVE: Temp:  [  97.7 F (36.5 C)-98.4 F (36.9 C)] 98.4 F (36.9 C) (06/07 1200) Pulse Rate:  [80-97] 84 (06/07 1200) Resp:  [16-26] 18 (06/07 1200) BP: (105-148)/(59-98) 130/98 (06/07 1200) SpO2:  [93 %-100 %] 93 % (06/07 1200) Weight:  [92.3 kg (203 lb 6.4 oz)-96.2 kg (212 lb)] 92.3 kg (203 lb 6.4 oz) (06/06 2126) Physical Exam  Constitutional:  oriented to person, place, and time. Obese,Not  on O2, no  resp distress HENT: [place]/AT, PERRLA, no scleral icterus Mouth/Throat: Oropharynx is clear and moist. No oropharyngeal exudate.  Cardiovascular: Normal rate, regular rhythm and normal heart sounds. Pulmonary/Chest: poor air movement,  Neck = supple, no nuchal rigidity Abdominal: Soft. Bowel sounds are normal.  exhibits no  distension. There is no tenderness.  Lymphadenopathy: no cervical adenopathy. No axillary adenopathy Neurological: alert and oriented to person, place, and time.  Skin: Skin is warm and dry. No rash noted. No erythema.  Psychiatric: a normal mood and affect.  behavior is normal.    LABS: Results for orders placed or performed during the hospital encounter of 07/14/2016 (from the past 48 hour(s))  CBC with Differential/Platelet     Status: Abnormal   Collection Time: 08/04/2016  3:05 PM  Result Value Ref Range   WBC 9.1 3.6 - 11.0 K/uL   RBC 4.80 3.80 - 5.20 MIL/uL   Hemoglobin 15.1 12.0 - 16.0 g/dL   HCT 43.8 35.0 - 47.0 %   MCV 91.2 80.0 - 100.0 fL   MCH 31.4 26.0 - 34.0 pg   MCHC 34.4 32.0 - 36.0 g/dL   RDW 14.9 (H) 11.5 - 14.5 %   Platelets 218 150 - 440 K/uL   Neutrophils Relative % 68 %   Neutro Abs 6.2 1.4 - 6.5 K/uL   Lymphocytes Relative 15 %   Lymphs Abs 1.4 1.0 - 3.6 K/uL   Monocytes Relative 17 %   Monocytes Absolute 1.5 (H) 0.2 - 0.9 K/uL   Eosinophils Relative 0 %   Eosinophils Absolute 0.0 0 - 0.7 K/uL   Basophils Relative 0 %   Basophils Absolute 0.0 0 - 0.1 K/uL  Basic metabolic panel     Status: Abnormal   Collection Time: 08/08/2016  3:05 PM  Result Value Ref Range   Sodium 122 (L) 135 - 145 mmol/L   Potassium 3.5 3.5 - 5.1 mmol/L   Chloride 87 (L) 101 - 111 mmol/L   CO2 24 22 - 32 mmol/L   Glucose, Bld 136 (H) 65 - 99 mg/dL   BUN 24 (H) 6 - 20 mg/dL   Creatinine, Ser 1.07 (H) 0.44 - 1.00 mg/dL   Calcium 8.8 (L) 8.9 - 10.3 mg/dL   GFR calc non Af Amer 54 (L) >60 mL/min   GFR calc Af Amer >60 >60 mL/min    Comment: (NOTE) The eGFR has been calculated using the CKD EPI equation. This calculation has not been validated in all clinical situations. eGFR's persistently <60 mL/min signify possible Chronic Kidney Disease.    Anion gap 11 5 - 15  Troponin I     Status: None   Collection Time: 07/28/2016  3:05 PM  Result Value Ref Range   Troponin I <0.03 <0.03  ng/mL  Hepatic function panel     Status: Abnormal   Collection Time: 07/16/2016  3:05 PM  Result Value Ref Range   Total Protein 7.8 6.5 - 8.1 g/dL   Albumin 3.3 (L) 3.5 - 5.0 g/dL   AST 96 (H) 15 - 41  U/L   ALT 100 (H) 14 - 54 U/L   Alkaline Phosphatase 70 38 - 126 U/L   Total Bilirubin 4.0 (H) 0.3 - 1.2 mg/dL   Bilirubin, Direct 1.4 (H) 0.1 - 0.5 mg/dL   Indirect Bilirubin 2.6 (H) 0.3 - 0.9 mg/dL  Blood culture (routine x 2)     Status: None (Preliminary result)   Collection Time: 07/13/2016  4:56 PM  Result Value Ref Range   Specimen Description BLOOD LAC    Special Requests      BOTTLES DRAWN AEROBIC AND ANAEROBIC Blood Culture results may not be optimal due to an inadequate volume of blood received in culture bottles   Culture NO GROWTH < 24 HOURS    Report Status PENDING   Blood culture (routine x 2)     Status: None (Preliminary result)   Collection Time: 08/07/2016  4:56 PM  Result Value Ref Range   Specimen Description BLOOD R HAND    Special Requests      BOTTLES DRAWN AEROBIC AND ANAEROBIC Blood Culture results may not be optimal due to an inadequate volume of blood received in culture bottles   Culture  Setup Time Organism ID to follow    Culture NO GROWTH < 24 HOURS    Report Status PENDING   Brain natriuretic peptide     Status: None   Collection Time: 08/08/2016  5:31 PM  Result Value Ref Range   B Natriuretic Peptide 17.0 0.0 - 100.0 pg/mL  Basic metabolic panel     Status: Abnormal   Collection Time: 07/28/2016  9:50 PM  Result Value Ref Range   Sodium 123 (L) 135 - 145 mmol/L   Potassium 3.0 (L) 3.5 - 5.1 mmol/L   Chloride 86 (L) 101 - 111 mmol/L   CO2 27 22 - 32 mmol/L   Glucose, Bld 132 (H) 65 - 99 mg/dL   BUN 24 (H) 6 - 20 mg/dL   Creatinine, Ser 1.16 (H) 0.44 - 1.00 mg/dL   Calcium 8.4 (L) 8.9 - 10.3 mg/dL   GFR calc non Af Amer 49 (L) >60 mL/min   GFR calc Af Amer 57 (L) >60 mL/min    Comment: (NOTE) The eGFR has been calculated using the CKD EPI  equation. This calculation has not been validated in all clinical situations. eGFR's persistently <60 mL/min signify possible Chronic Kidney Disease.    Anion gap 10 5 - 15  CBC     Status: Abnormal   Collection Time: 07/16/16  1:53 AM  Result Value Ref Range   WBC 7.5 3.6 - 11.0 K/uL   RBC 4.76 3.80 - 5.20 MIL/uL   Hemoglobin 14.9 12.0 - 16.0 g/dL   HCT 43.4 35.0 - 47.0 %   MCV 91.3 80.0 - 100.0 fL   MCH 31.2 26.0 - 34.0 pg   MCHC 34.2 32.0 - 36.0 g/dL   RDW 14.9 (H) 11.5 - 14.5 %   Platelets 193 150 - 440 K/uL  Basic metabolic panel     Status: Abnormal   Collection Time: 07/16/16  1:53 AM  Result Value Ref Range   Sodium 121 (L) 135 - 145 mmol/L   Potassium 3.3 (L) 3.5 - 5.1 mmol/L   Chloride 86 (L) 101 - 111 mmol/L   CO2 25 22 - 32 mmol/L   Glucose, Bld 144 (H) 65 - 99 mg/dL   BUN 23 (H) 6 - 20 mg/dL   Creatinine, Ser 1.10 (H) 0.44 - 1.00 mg/dL  Calcium 8.4 (L) 8.9 - 10.3 mg/dL   GFR calc non Af Amer 52 (L) >60 mL/min   GFR calc Af Amer >60 >60 mL/min    Comment: (NOTE) The eGFR has been calculated using the CKD EPI equation. This calculation has not been validated in all clinical situations. eGFR's persistently <60 mL/min signify possible Chronic Kidney Disease.    Anion gap 10 5 - 15  Glucose, capillary     Status: Abnormal   Collection Time: 07/16/16  5:48 AM  Result Value Ref Range   Glucose-Capillary 119 (H) 65 - 99 mg/dL  Glucose, capillary     Status: Abnormal   Collection Time: 07/16/16  7:38 AM  Result Value Ref Range   Glucose-Capillary 128 (H) 65 - 99 mg/dL  Basic metabolic panel     Status: Abnormal   Collection Time: 07/16/16  8:22 AM  Result Value Ref Range   Sodium 122 (L) 135 - 145 mmol/L   Potassium 3.4 (L) 3.5 - 5.1 mmol/L    Comment: HEMOLYSIS AT THIS LEVEL MAY AFFECT RESULT   Chloride 91 (L) 101 - 111 mmol/L   CO2 22 22 - 32 mmol/L   Glucose, Bld 131 (H) 65 - 99 mg/dL   BUN 22 (H) 6 - 20 mg/dL   Creatinine, Ser 0.92 0.44 - 1.00 mg/dL    Calcium 8.1 (L) 8.9 - 10.3 mg/dL   GFR calc non Af Amer >60 >60 mL/min   GFR calc Af Amer >60 >60 mL/min    Comment: (NOTE) The eGFR has been calculated using the CKD EPI equation. This calculation has not been validated in all clinical situations. eGFR's persistently <60 mL/min signify possible Chronic Kidney Disease.    Anion gap 9 5 - 15  Magnesium     Status: None   Collection Time: 07/16/16  8:22 AM  Result Value Ref Range   Magnesium 2.2 1.7 - 2.4 mg/dL  Osmolality     Status: Abnormal   Collection Time: 07/16/16 11:19 AM  Result Value Ref Range   Osmolality 268 (L) 275 - 295 mOsm/kg   No components found for: ESR, C REACTIVE PROTEIN MICRO: Recent Results (from the past 720 hour(s))  Respiratory Panel by PCR     Status: None   Collection Time: 07/07/16  4:57 PM  Result Value Ref Range Status   Adenovirus NOT DETECTED NOT DETECTED Final   Coronavirus 229E NOT DETECTED NOT DETECTED Final   Coronavirus HKU1 NOT DETECTED NOT DETECTED Final   Coronavirus NL63 NOT DETECTED NOT DETECTED Final   Coronavirus OC43 NOT DETECTED NOT DETECTED Final   Metapneumovirus NOT DETECTED NOT DETECTED Final   Rhinovirus / Enterovirus NOT DETECTED NOT DETECTED Final   Influenza A NOT DETECTED NOT DETECTED Final   Influenza B NOT DETECTED NOT DETECTED Final   Parainfluenza Virus 1 NOT DETECTED NOT DETECTED Final   Parainfluenza Virus 2 NOT DETECTED NOT DETECTED Final   Parainfluenza Virus 3 NOT DETECTED NOT DETECTED Final   Parainfluenza Virus 4 NOT DETECTED NOT DETECTED Final   Respiratory Syncytial Virus NOT DETECTED NOT DETECTED Final   Bordetella pertussis NOT DETECTED NOT DETECTED Final   Chlamydophila pneumoniae NOT DETECTED NOT DETECTED Final   Mycoplasma pneumoniae NOT DETECTED NOT DETECTED Final    Comment: Performed at [facility], [place] [address] [place], [place] [address]  Culture, expectorated sputum-assessment     Status: None   Collection Time: 07/08/16 12:48 PM   Result Value Ref Range Status  Specimen Description EXPECTORATED SPUTUM  Final   Special Requests Immunocompromised  Final   Sputum evaluation THIS SPECIMEN IS ACCEPTABLE FOR SPUTUM CULTURE  Final   Report Status 07/09/2016 FINAL  Final  Culture, respiratory (NON-Expectorated)     Status: None   Collection Time: 07/08/16 12:48 PM  Result Value Ref Range Status   Specimen Description EXPECTORATED SPUTUM  Final   Special Requests Immunocompromised Reflexed from [id number]  Final   Gram Stain   Final    MODERATE WBC PRESENT,BOTH PMN AND MONONUCLEAR RARE GRAM POSITIVE COCCI IN PAIRS RARE GRAM POSITIVE RODS    Culture   Final    Consistent with normal respiratory flora. Performed at [facility], [place] [address] [place], [place] [address]    Report Status 07/11/2016 FINAL  Final  Blood culture (routine x 2)     Status: None (Preliminary result)   Collection Time: 08/04/2016  4:56 PM  Result Value Ref Range Status   Specimen Description BLOOD LAC  Final   Special Requests   Final    BOTTLES DRAWN AEROBIC AND ANAEROBIC Blood Culture results may not be optimal due to an inadequate volume of blood received in culture bottles   Culture NO GROWTH < 24 HOURS  Final   Report Status PENDING  Incomplete  Blood culture (routine x 2)     Status: None (Preliminary result)   Collection Time: 07/31/2016  4:56 PM  Result Value Ref Range Status   Specimen Description BLOOD R HAND  Final   Special Requests   Final    BOTTLES DRAWN AEROBIC AND ANAEROBIC Blood Culture results may not be optimal due to an inadequate volume of blood received in culture bottles   Culture  Setup Time Organism ID to follow  Final   Culture NO GROWTH < 24 HOURS  Final   Report Status PENDING  Incomplete    IMAGING: Dg Chest 2 View  Result Date: 07/31/2016 CLINICAL DATA:  Recently hospitalized with pneumonia. Patient returns with shortness of breath. History of hypertension and HIV. EXAM: CHEST  2 VIEW COMPARISON:  Chest  x-ray of Jul 09, 2016 FINDINGS: The lungs are reasonably well inflated. There is a markings are increased. There is are areas of atelectasis or linear infiltrate at the lung bases similar to that seen on May 31st. There is no large pleural effusion. The cardiac silhouette is mildly enlarged. There is calcification in the wall of the thoracic aorta. There is multilevel degenerative disc disease of the thoracic spine. IMPRESSION: Bilateral interstitial pneumonia greatest at the lung bases. No CHF. The appearance of the chest has deteriorated since the PA and lateral chest x-ray of Jul 04, 2016 and is slightly worse than on the the Jul 09, 2016 study. Electronically Signed   By: David  Martinique M.D.   On: 07/29/2016 16:07   Dg Chest 2 View  Result Date: 07/09/2016 CLINICAL DATA:  Pneumonia. EXAM: CHEST  2 VIEW COMPARISON:  Radiographs of Jul 04, 2016. FINDINGS: Stable cardiomediastinal silhouette. No pneumothorax is noted. Mildly increased bibasilar opacities are noted concerning for atelectasis or infiltrates, right greater than left. Minimal right pleural effusion may be present. Bony thorax is unremarkable. IMPRESSION: Mildly increased bibasilar atelectasis or infiltrates are noted, right greater than left. Electronically Signed   By: Marijo Conception, M.D.   On: 07/09/2016 08:43   Dg Chest 2 View  Result Date: 07/04/2016 CLINICAL DATA:  Cough and congestion for several days EXAM: CHEST  2 VIEW COMPARISON:  02/03/2013 FINDINGS: Cardiac shadow is mildly enlarged. Mild platelike atelectasis is noted in the bases bilaterally. Mild vascular congestion with mild interstitial edema is seen. No sizable effusion or infiltrate is noted. Degenerative changes of the thoracic spine are seen. IMPRESSION: Mild left basilar atelectasis. Mild vascular congestion interstitial edema. Electronically Signed   By: Inez Catalina M.D.   On: 07/04/2016 14:08   Dg Ribs Unilateral Right  Result Date: 07/07/2016 CLINICAL DATA:  Right  lower anterior rib pain when coughing. EXAM: RIGHT RIBS - 2 VIEW COMPARISON:  Chest CT from 3 days prior FINDINGS: No fracture or other bone lesions are seen involving the ribs. Streaky opacity at the right base consistent with atelectasis. Layering gallstones. IMPRESSION: 1. Negative right rib series. 2. Atelectasis at the right base. 3. Cholelithiasis. Electronically Signed   By: Monte Fantasia M.D.   On: 07/07/2016 10:26   Ct Angio Chest Pe W Or Wo Contrast  Result Date: 07/04/2016 CLINICAL DATA:  Cough and congestion for several days EXAM: CT ANGIOGRAPHY CHEST WITH CONTRAST TECHNIQUE: Multidetector CT imaging of the chest was performed using the standard protocol during bolus administration of intravenous contrast. Multiplanar CT image reconstructions and MIPs were obtained to evaluate the vascular anatomy. CONTRAST:  75 mL Isovue 370. COMPARISON:  Plain film from earlier in the same day. FINDINGS: Cardiovascular: Atherosclerotic calcifications of the aorta are noted without aneurysmal dilatation or dissection. Pulmonary artery demonstrates a normal branching pattern. No filling defects to suggest pulmonary emboli are identified. No significant cardiac enlargement is seen. No coronary calcifications are noted. Mediastinum/Nodes: No significant hilar or mediastinal adenopathy is noted. The thoracic inlet is within normal limits. Lungs/Pleura: Lungs are well aerated bilaterally. No focal infiltrate or sizable effusion is seen. Platelike atelectasis is noted in the left lower lobe similar to that seen on recent plain film examination. No sizable effusion or pneumothorax is noted. Upper Abdomen: Mild fatty infiltration of the liver is seen. The remainder the upper abdomen is within normal limits. Musculoskeletal: Degenerative changes of the thoracic spine are noted. No acute bony abnormality is seen. Review of the MIP images confirms the above findings. IMPRESSION: No evidence of pulmonary emboli. Mild left  lower lobe atelectasis similar to that seen on prior plain film examination. No focal confluent infiltrate is seen. Electronically Signed   By: Inez Catalina M.D.   On: 07/04/2016 15:47   Ct Abdomen Pelvis W Contrast  Result Date: 08/02/2016 CLINICAL DATA:  Acute right lower quadrant pain. EXAM: CT ABDOMEN AND PELVIS WITH CONTRAST TECHNIQUE: Multidetector CT imaging of the abdomen and pelvis was performed using the standard protocol following bolus administration of intravenous contrast. CONTRAST:  137m ISOVUE-300 IOPAMIDOL (ISOVUE-300) INJECTION 61% COMPARISON:  01/24/2013 FINDINGS: Lower chest: Cardiomegaly. Subsegmental atelectasis or scarring at the bases. Hepatobiliary: No focal liver abnormality.Cholelithiasis. No evidence of acute cholecystitis or biliary obstruction Pancreas: Unremarkable. Spleen: Unremarkable. Adrenals/Urinary Tract: Negative adrenals. No hydronephrosis or stone. 18 mm simple appearing left renal cyst. Unremarkable bladder. Stomach/Bowel: No obstruction. Moderate colonic gas and stool. No pericecal or other inflammation. Vascular/Lymphatic: Narrow appearance of the main portal vein that is chronic. 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 No mass or adenopathy. Reproductive:Hysterectomy and possible right oophorectomy. There is a left adnexal cyst measuring 4.1 cm. Enhancing tissue along the left aspect of the wall could be residual ovary. This cyst had internal complex material in 2014 pelvic sonography. [facility], when complexity  was also noted at that time. Other: No ascites or pneumoperitoneum. Musculoskeletal: Spondylosis and facet arthropathy. No acute or aggressive finding. IMPRESSION: 1. No acute finding. 2. Cholelithiasis without signs of cholecystitis. 3. 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 4. Chronic left adnexal cyst measuring up to 4.1 cm,  size stable from 2009. Recommend yearly pelvic sonography. Electronically Signed   By: Monte Fantasia M.D.   On: 07/16/2016 16:58   US Abdomen Limited Ruq  Result Date: 08/01/2016 CLINICAL DATA:  Hyperbilirubinemia.  Known cholelithiasis. EXAM: ULTRASOUND ABDOMEN LIMITED RIGHT UPPER QUADRANT COMPARISON:  CT abdomen pelvis performed earlier today, 01/24/2013 and earlier. Right upper quadrant abdominal ultrasound 02/01/2013, 01/24/2013 and earlier. FINDINGS: Gallbladder: Multiple echogenic gallstones, the largest on the order of 10 mm. No gallbladder wall thickening or pericholecystic fluid. Negative sonographic Murphy's sign according to the ultrasound technologist. Common bile duct: Diameter: Approximately 5 mm proximally. No visible stones in the proximal duct. The mid and distal duct are obscured by duodenal bowel gas. Liver: Scattered geographic areas of increased parenchymal echotexture. No parenchymal masses. Patent though narrowed main portal vein with hepatopetal flow (as noted on the earlier CT). IMPRESSION: 1. Cholelithiasis without sonographic evidence of acute cholecystitis. 2. No evidence of biliary ductal dilation. 3. Scattered areas of focal hepatic steatosis. No hepatic masses. Narrowed main portal vein with hepatopetal flow (as noted on the earlier CT). Electronically Signed   By: Evangeline Dakin M.D.   On: 07/20/2016 19:13     Assessment:   [name] is a 66 y.o. female  with poorly controlled HIV, prior otpt CD4 203,  but CD4 on 5/26 down to 87/12%, viral load down to 200.  She was  restarted within last few weeks on ART with prezcobix and darunavir. She was  recently admitted with cough, sob, hypoxia and treated with bactrim and steroids, now readmitted with SOB, cough, abd pain. Found to have low sodium as well. Utox + cocaine and she has hx ETOH abuse and has had some beers since DC. No fevers and not hypoxic.  CT abd neg.  Prior admit sputum cx neg.  I suspect her admission is  more related to polysubstance abuse, but will need to be monitored for IRIS or for recurrent infection.  Recommendations I would continue her course of treatment for PCP with 21 day course.  If remains afebrile can dc cefepime  Continue ART with biktarvy and prezcobix.  Will monitor.  Thank you very much for allowing me to participate in the care of this patient. Please call with questions.   Cheral Marker. Ola Spurr, MD

## 2016-07-16 NOTE — Progress Notes (Signed)
Inpatient Diabetes Program Recommendations  AACE/ADA: New Consensus Statement on Inpatient Glycemic Control (2015)  Target Ranges:  Prepandial:   less than 140 mg/dL      Peak postprandial:   less than 180 mg/dL (1-2 hours)      Critically ill patients:  140 - 180 mg/dL   RResults for [name], Sheyla (MRN [id number]) as of 07/16/2016 11:39  Ref. Range 07/17/2016 21:50 07/16/2016 01:53 07/16/2016 08:22  Glucose Latest Ref Range: 65 - 99 mg/dL 409132 (H) 811144 (H) 914131 (H)  esults for [name], [name] (MRN [id number]) as of 07/16/2016 11:39  Ref. Range 07/16/2016 05:48 07/16/2016 07:38  Glucose-Capillary Latest Ref Range: 65 - 99 mg/dL 086119 (H) 578128 (H)   Review of Glycemic Control  Diabetes history: DM2 Outpatient Diabetes medications: None Current orders for Inpatient glycemic control: None  Inpatient Diabetes Program Recommendations:  Correction (SSI): Please consider ordering CBGs with Novolog 0-9 units TID with meals and Novolog 0-5 units QHS. HgbA1C: A1C 7.6% on 07/09/16 indicating an average glucose of 171 mg/dl over the past 2-3 months. Patient has a history of DM2 but is not prescribed any DM medications as an outpatient (per home medication list). Recommend patient follow up with PCP regarding DM control.  NOTE: Note recent hospitalization from 07/04/16 to 07/13/16 in which steroids (IV and PO) were given. A1C obtained on 07/09/16 after patient had received steroids for several days so A1C results impacted by recent steroids.  Thanks, Orlando PennerMarie Charlina Dwight, RN, MSN, CDE Diabetes Coordinator Inpatient Diabetes Program [phone] (Team Pager from 8am to 5pm)

## 2016-07-16 NOTE — Progress Notes (Signed)
[employer] [facility] [place] [facility]                                                                                                                                                                                  Patient Demographics   [name], is a 66 y.o. female, DOB - 1950/11/01, BJY:[id number]  Admit date - 14-Aug-2016   Admitting Physician Katha Hamming, MD  Outpatient Primary MD for the patient is [facility]   LOS - 1  Subjective: Patient was hospitalized and discharged 2 days ago by me because she insisted on going home and had arranged for her to have home oxygen she was also discharged on Bactrim therapy as well as steroids. Returns back with respiratory difficulties chest x-ray shows worsening. Patient states that she doesn't recall leaving 2 days ago she left last week. As per nurse patient having some difficulty swallowing patient denies this   Review of Systems:   CONSTITUTIONAL: No documented fever. No fatigue, weakness. No weight gain, no weight loss.  EYES: No blurry or double vision.  ENT: No tinnitus. No postnasal drip. No redness of the oropharynx.  RESPIRATORY: Positive cough, no wheeze, no hemoptysis. Positive dyspnea.  CARDIOVASCULAR: No chest pain. No orthopnea. No palpitations. No syncope.  GASTROINTESTINAL: No nausea, no vomiting or diarrhea. No abdominal pain. No melena or hematochezia.  GENITOURINARY: No dysuria or hematuria.  ENDOCRINE: No polyuria or nocturia. No heat or cold intolerance.  HEMATOLOGY: No anemia. No bruising. No bleeding.  INTEGUMENTARY: No rashes. No lesions.  MUSCULOSKELETAL: No arthritis. No swelling. No gout.  NEUROLOGIC: No numbness, tingling, or ataxia. No seizure-type activity.  PSYCHIATRIC: No anxiety. No insomnia. No ADD.    Vitals:   Vitals:   07/16/16 0443 07/16/16 0741 07/16/16 1012 07/16/16 1200  BP: (!) 130/59  128/68 (!) 130/98  Pulse: 85  80 84  Resp: 20  16 18    Temp: 97.7 F (36.5 C)  97.8 F (36.6 C) 98.4 F (36.9 C)  TempSrc: Oral  Oral Oral  SpO2: 95% 100% 100% 93%  Weight:      Height:        Wt Readings from Last 3 Encounters:  08-14-16 203 lb 6.4 oz (92.3 kg)  07/04/16 212 lb (96.2 kg)     Intake/Output Summary (Last 24 hours) at 07/16/16 1320 Last data filed at 07/16/16 1031  Gross per 24 hour  Intake           1482.5 ml  Output              500 ml  Net  982.5 ml    Physical Exam:   GENERAL: Pleasant-appearing in no apparent distress.  HEAD, EYES, EARS, NOSE AND THROAT: Atraumatic, normocephalic. Extraocular muscles are intact. Pupils equal and reactive to light. Sclerae anicteric. No conjunctival injection. No oro-pharyngeal erythema.  NECK: Supple. There is no jugular venous distention. No bruits, no lymphadenopathy, no thyromegaly.  HEART: Regular rate and rhythm,. No murmurs, no rubs, no clicks.  LUNGS: Rhonchus breath sounds bilaterally.  ABDOMEN: Soft, flat, nontender, nondistended. Has good bowel sounds. No hepatosplenomegaly appreciated.  EXTREMITIES: No evidence of any cyanosis, clubbing, or peripheral edema.  +2 pedal and radial pulses bilaterally.  NEUROLOGIC: The patient is alert, awake, and oriented x3 with no focal motor or sensory deficits appreciated bilaterally.  SKIN: Moist and warm with no rashes appreciated.  Psych: Not anxious, depressed LN: No inguinal LN enlargement    Antibiotics   Anti-infectives    Start     Dose/Rate Route Frequency Ordered Stop   07/16/16 1200  fluconazole (DIFLUCAN) IVPB 100 mg     100 mg 50 mL/hr over 60 Minutes Intravenous Daily 07/16/16 1052     07/16/16 1000  bictegravir-emtricitabine-tenofovir AF (BIKTARVY) 50-200-25 MG per tablet 1 tablet     1 tablet Oral Daily 07/16/16 0428     07/16/16 0800  darunavir-cobicistat (PREZCOBIX) 800-150 MG per tablet 1 tablet    Comments:  Swallow whole. Do NOT crush, break or chew tablets. Take with food.     1 tablet  Oral Daily with breakfast 07/17/2016 1953     07/16/16 0700  sulfamethoxazole-trimethoprim (BACTRIM DS,SEPTRA DS) 800-160 MG per tablet 2 tablet     2 tablet Oral 3 times daily 07/16/16 0147     07/16/16 0600  ceFEPIme (MAXIPIME) 2 g in dextrose 5 % 50 mL IVPB     2 g 100 mL/hr over 30 Minutes Intravenous Every 12 hours 07/13/2016 2016     07/16/16 0000  sulfamethoxazole-trimethoprim (BACTRIM DS,SEPTRA DS) 800-160 MG per tablet 2 tablet  Status:  Discontinued     2 tablet Oral 3 times daily 08/08/2016 1953 07/16/16 0147   08/03/2016 2300  vancomycin (VANCOCIN) IVPB 1000 mg/200 mL premix     1,000 mg 200 mL/hr over 60 Minutes Intravenous Every 18 hours 08/04/2016 2016     07/25/2016 2015  sulfamethoxazole-trimethoprim (BACTRIM,SEPTRA) 400-80 MG per tablet 1 tablet  Status:  Discontinued     1 tablet Oral  Once 07/30/2016 2005 08/08/2016 2009   07/27/2016 2015  sulfamethoxazole-trimethoprim (BACTRIM DS,SEPTRA DS) 800-160 MG per tablet 1 tablet     1 tablet Oral  Once 07/31/2016 2009 07/16/2016 2321   07/14/2016 2000  dolutegravir (TIVICAY) tablet 50 mg     50 mg Oral Daily 08/04/2016 1953     07/14/2016 1645  ceFEPIme (MAXIPIME) 2 g in dextrose 5 % 50 mL IVPB     2 g 100 mL/hr over 30 Minutes Intravenous  Once 07/24/2016 1643 07/13/2016 2328   07/26/2016 1645  vancomycin (VANCOCIN) IVPB 1000 mg/200 mL premix     1,000 mg 200 mL/hr over 60 Minutes Intravenous  Once 08/04/2016 1643 08/03/2016 2328   08/05/2016 1645  sulfamethoxazole-trimethoprim (BACTRIM DS,SEPTRA DS) 800-160 MG per tablet 1 tablet     1 tablet Oral  Once 07/31/2016 1643 08/02/2016 1700      Medications   Scheduled Meds: . amitriptyline  25 mg Oral QHS  . bictegravir-emtricitabine-tenofovir AF  1 tablet Oral Daily  . darunavir-cobicistat  1 tablet Oral Q breakfast  . docusate  sodium  100 mg Oral BID  . dolutegravir  50 mg Oral Daily  . heparin  5,000 Units Subcutaneous Q8H  . ipratropium-albuterol  3 mL Nebulization TID  . predniSONE  40 mg Oral BID WC  .  QUEtiapine  25 mg Oral QHS  . ramipril  10 mg Oral QPM  . sulfamethoxazole-trimethoprim  2 tablet Oral TID   Continuous Infusions: . sodium chloride 75 mL/hr at 07/16/16 1252  . ceFEPime (MAXIPIME) IV Stopped (07/16/16 0746)  . fluconazole (DIFLUCAN) IV 100 mg (07/16/16 1246)  . vancomycin Stopped (07/16/16 0030)   PRN Meds:.acetaminophen **OR** acetaminophen, bisacodyl, ondansetron **OR** ondansetron (ZOFRAN) IV, traZODone   Data Review:   Micro Results Recent Results (from the past 240 hour(s))  Respiratory Panel by PCR     Status: None   Collection Time: 07/07/16  4:57 PM  Result Value Ref Range Status   Adenovirus NOT DETECTED NOT DETECTED Final   Coronavirus 229E NOT DETECTED NOT DETECTED Final   Coronavirus HKU1 NOT DETECTED NOT DETECTED Final   Coronavirus NL63 NOT DETECTED NOT DETECTED Final   Coronavirus OC43 NOT DETECTED NOT DETECTED Final   Metapneumovirus NOT DETECTED NOT DETECTED Final   Rhinovirus / Enterovirus NOT DETECTED NOT DETECTED Final   Influenza A NOT DETECTED NOT DETECTED Final   Influenza B NOT DETECTED NOT DETECTED Final   Parainfluenza Virus 1 NOT DETECTED NOT DETECTED Final   Parainfluenza Virus 2 NOT DETECTED NOT DETECTED Final   Parainfluenza Virus 3 NOT DETECTED NOT DETECTED Final   Parainfluenza Virus 4 NOT DETECTED NOT DETECTED Final   Respiratory Syncytial Virus NOT DETECTED NOT DETECTED Final   Bordetella pertussis NOT DETECTED NOT DETECTED Final   Chlamydophila pneumoniae NOT DETECTED NOT DETECTED Final   Mycoplasma pneumoniae NOT DETECTED NOT DETECTED Final    Comment: Performed at [facility], [address], [place], [place] [address]  Culture, expectorated sputum-assessment     Status: None   Collection Time: 07/08/16 12:48 PM  Result Value Ref Range Status   Specimen Description EXPECTORATED SPUTUM  Final   Special Requests Immunocompromised  Final   Sputum evaluation THIS SPECIMEN IS ACCEPTABLE FOR SPUTUM CULTURE  Final    Report Status 07/09/2016 FINAL  Final  Culture, respiratory (NON-Expectorated)     Status: None   Collection Time: 07/08/16 12:48 PM  Result Value Ref Range Status   Specimen Description EXPECTORATED SPUTUM  Final   Special Requests Immunocompromised Reflexed from [id number]  Final   Gram Stain   Final    MODERATE WBC PRESENT,BOTH PMN AND MONONUCLEAR RARE GRAM POSITIVE COCCI IN PAIRS RARE GRAM POSITIVE RODS    Culture   Final    Consistent with normal respiratory flora. Performed at [facility], [address], [place], [place] [address]    Report Status 07/11/2016 FINAL  Final  Blood culture (routine x 2)     Status: None (Preliminary result)   Collection Time: 2016/08/01  4:56 PM  Result Value Ref Range Status   Specimen Description BLOOD LAC  Final   Special Requests   Final    BOTTLES DRAWN AEROBIC AND ANAEROBIC Blood Culture results may not be optimal due to an inadequate volume of blood received in culture bottles   Culture NO GROWTH < 24 HOURS  Final   Report Status PENDING  Incomplete  Blood culture (routine x 2)     Status: None (Preliminary result)   Collection Time: 2016-08-01  4:56 PM  Result  Value Ref Range Status   Specimen Description BLOOD R HAND  Final   Special Requests   Final    BOTTLES DRAWN AEROBIC AND ANAEROBIC Blood Culture results may not be optimal due to an inadequate volume of blood received in culture bottles   Culture NO GROWTH < 24 HOURS  Final   Report Status PENDING  Incomplete    Radiology Reports Dg Chest 2 View  Result Date: 08-05-16 CLINICAL DATA:  Recently hospitalized with pneumonia. Patient returns with shortness of breath. History of hypertension and HIV. EXAM: CHEST  2 VIEW COMPARISON:  Chest x-ray of Jul 09, 2016 FINDINGS: The lungs are reasonably well inflated. There is a markings are increased. There is are areas of atelectasis or linear infiltrate at the lung bases similar to that seen on May 31st. There is no large pleural effusion.  The cardiac silhouette is mildly enlarged. There is calcification in the wall of the thoracic aorta. There is multilevel degenerative disc disease of the thoracic spine. IMPRESSION: Bilateral interstitial pneumonia greatest at the lung bases. No CHF. The appearance of the chest has deteriorated since the PA and lateral chest x-ray of Jul 04, 2016 and is slightly worse than on the the Jul 09, 2016 study. Electronically Signed   By: David  [place] M.D.   On: 08/05/2016 16:07   Dg Chest 2 View  Result Date: 07/09/2016 CLINICAL DATA:  Pneumonia. EXAM: CHEST  2 VIEW COMPARISON:  Radiographs of Jul 04, 2016. FINDINGS: Stable cardiomediastinal silhouette. No pneumothorax is noted. Mildly increased bibasilar opacities are noted concerning for atelectasis or infiltrates, right greater than left. Minimal right pleural effusion may be present. Bony thorax is unremarkable. IMPRESSION: Mildly increased bibasilar atelectasis or infiltrates are noted, right greater than left. Electronically Signed   By: Lupita Raider, M.D.   On: 07/09/2016 08:43   Dg Chest 2 View  Result Date: 07/04/2016 CLINICAL DATA:  Cough and congestion for several days EXAM: CHEST  2 VIEW COMPARISON:  02/03/2013 FINDINGS: Cardiac shadow is mildly enlarged. Mild platelike atelectasis is noted in the bases bilaterally. Mild vascular congestion with mild interstitial edema is seen. No sizable effusion or infiltrate is noted. Degenerative changes of the thoracic spine are seen. IMPRESSION: Mild left basilar atelectasis. Mild vascular congestion interstitial edema. Electronically Signed   By: Alcide Clever M.D.   On: 07/04/2016 14:08   Dg Ribs Unilateral Right  Result Date: 07/07/2016 CLINICAL DATA:  Right lower anterior rib pain when coughing. EXAM: RIGHT RIBS - 2 VIEW COMPARISON:  Chest CT from 3 days prior FINDINGS: No fracture or other bone lesions are seen involving the ribs. Streaky opacity at the right base consistent with atelectasis. Layering  gallstones. IMPRESSION: 1. Negative right rib series. 2. Atelectasis at the right base. 3. Cholelithiasis. Electronically Signed   By: Marnee Spring M.D.   On: 07/07/2016 10:26   Ct Angio Chest Pe W Or Wo Contrast  Result Date: 07/04/2016 CLINICAL DATA:  Cough and congestion for several days EXAM: CT ANGIOGRAPHY CHEST WITH CONTRAST TECHNIQUE: Multidetector CT imaging of the chest was performed using the standard protocol during bolus administration of intravenous contrast. Multiplanar CT image reconstructions and MIPs were obtained to evaluate the vascular anatomy. CONTRAST:  75 mL Isovue 370. COMPARISON:  Plain film from earlier in the same day. FINDINGS: Cardiovascular: Atherosclerotic calcifications of the aorta are noted without aneurysmal dilatation or dissection. Pulmonary artery demonstrates a normal branching pattern. No filling defects to suggest pulmonary emboli are identified. No  significant cardiac enlargement is seen. No coronary calcifications are noted. Mediastinum/Nodes: No significant hilar or mediastinal adenopathy is noted. The thoracic inlet is within normal limits. Lungs/Pleura: Lungs are well aerated bilaterally. No focal infiltrate or sizable effusion is seen. Platelike atelectasis is noted in the left lower lobe similar to that seen on recent plain film examination. No sizable effusion or pneumothorax is noted. Upper Abdomen: Mild fatty infiltration of the liver is seen. The remainder the upper abdomen is within normal limits. Musculoskeletal: Degenerative changes of the thoracic spine are noted. No acute bony abnormality is seen. Review of the MIP images confirms the above findings. IMPRESSION: No evidence of pulmonary emboli. Mild left lower lobe atelectasis similar to that seen on prior plain film examination. No focal confluent infiltrate is seen. Electronically Signed   By: Alcide Clever M.D.   On: 07/04/2016 15:47   Ct Abdomen Pelvis W Contrast  Result Date: 08/07/2016 CLINICAL  DATA:  Acute right lower quadrant pain. EXAM: CT ABDOMEN AND PELVIS WITH CONTRAST TECHNIQUE: Multidetector CT imaging of the abdomen and pelvis was performed using the standard protocol following bolus administration of intravenous contrast. CONTRAST:  ISOVUE-300 IOPAMIDOL (ISOVUE-300) INJECTION 61% COMPARISON:  01/24/2013 FINDINGS: Lower chest: Cardiomegaly. Subsegmental atelectasis or scarring at the bases. Hepatobiliary: No focal liver abnormality.Cholelithiasis. No evidence of acute cholecystitis or biliary obstruction Pancreas: Unremarkable. Spleen: Unremarkable. Adrenals/Urinary Tract: Negative adrenals. No hydronephrosis or stone. 18 mm simple appearing left renal cyst. Unremarkable bladder. Stomach/Bowel: No obstruction. Moderate colonic gas and stool. No pericecal or other inflammation. Vascular/Lymphatic: Narrow appearance of the main portal vein that is chronic. 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 No mass or adenopathy. Reproductive:Hysterectomy and possible right oophorectomy. There is a left adnexal cyst measuring 4.1 cm. Enhancing tissue along the left aspect of the wall could be residual ovary. This cyst had internal complex material in 2014 pelvic sonography. This mass was also evaluated at [facility] per sonographic report May 2011, when complexity was also noted at that time. Other: No ascites or pneumoperitoneum. Musculoskeletal: Spondylosis and facet arthropathy. No acute or aggressive finding. IMPRESSION: 1. No acute finding. 2. Cholelithiasis without signs of cholecystitis. 3. 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 4. Chronic left adnexal cyst measuring up to 4.1 cm, size stable from 2009. Recommend yearly pelvic sonography. Electronically Signed   By: Marnee Spring M.D.   On: 07/29/2016 16:58   US Abdomen Limited Ruq  Result Date: 07/11/2016 CLINICAL DATA:  Hyperbilirubinemia.  Known cholelithiasis. EXAM:  ULTRASOUND ABDOMEN LIMITED RIGHT UPPER QUADRANT COMPARISON:  CT abdomen pelvis performed earlier today, 01/24/2013 and earlier. Right upper quadrant abdominal ultrasound 02/01/2013, 01/24/2013 and earlier. FINDINGS: Gallbladder: Multiple echogenic gallstones, the largest on the order of 10 mm. No gallbladder wall thickening or pericholecystic fluid. Negative sonographic Murphy's sign according to the ultrasound technologist. Common bile duct: Diameter: Approximately 5 mm proximally. No visible stones in the proximal duct. The mid and distal duct are obscured by duodenal bowel gas. Liver: Scattered geographic areas of increased parenchymal echotexture. No parenchymal masses. Patent though narrowed main portal vein with hepatopetal flow (as noted on the earlier CT). IMPRESSION: 1. Cholelithiasis without sonographic evidence of acute cholecystitis. 2. No evidence of biliary ductal dilation. 3. Scattered areas of focal hepatic steatosis. No hepatic masses. Narrowed main portal vein with hepatopetal flow (as noted on the earlier CT). Electronically Signed   By: Hulan Saas M.D.   On: 07/18/2016 19:13  CBC  Recent Labs Lab July 23, 2016 1505 07/16/16 0153  WBC 9.1 7.5  HGB 15.1 14.9  HCT 43.8 43.4  PLT 218 193  MCV 91.2 91.3  MCH 31.4 31.2  MCHC 34.4 34.2  RDW 14.9* 14.9*  LYMPHSABS 1.4  --   MONOABS 1.5*  --   EOSABS 0.0  --   BASOSABS 0.0  --     Chemistries   Recent Labs Lab 07/13/16 0531 2016/07/23 1505 07/23/16 2150 07/16/16 0153 07/16/16 0822  NA 127* 122* 123* 121* 122*  K 3.7 3.5 3.0* 3.3* 3.4*  CL 96* 87* 86* 86* 91*  CO2 20* 24 27 25 22   GLUCOSE 132* 136* 132* 144* 131*  BUN 23* 24* 24* 23* 22*  CREATININE 1.14* 1.07* 1.16* 1.10* 0.92  CALCIUM 8.4* 8.8* 8.4* 8.4* 8.1*  MG  --   --   --   --  2.2  AST  --  96*  --   --   --   ALT  --  100*  --   --   --   ALKPHOS  --  70  --   --   --   BILITOT  --  4.0*  --   --   --     ------------------------------------------------------------------------------------------------------------------ estimated creatinine clearance is 64.8 mL/min (by C-G formula based on SCr of 0.92 mg/dL). ------------------------------------------------------------------------------------------------------------------ No results for input(s): HGBA1C in the last 72 hours. ------------------------------------------------------------------------------------------------------------------ No results for input(s): CHOL, HDL, LDLCALC, TRIG, CHOLHDL, LDLDIRECT in the last 72 hours. ------------------------------------------------------------------------------------------------------------------ No results for input(s): TSH, T4TOTAL, T3FREE, THYROIDAB in the last 72 hours.  Invalid input(s): FREET3 ------------------------------------------------------------------------------------------------------------------ No results for input(s): VITAMINB12, FOLATE, FERRITIN, TIBC, IRON, RETICCTPCT in the last 72 hours.  Coagulation profile No results for input(s): INR, PROTIME in the last 168 hours.  No results for input(s): DDIMER in the last 72 hours.  Cardiac Enzymes  Recent Labs Lab 07-23-2016 1505  TROPONINI <0.03   ------------------------------------------------------------------------------------------------------------------ Invalid input(s): POCBNP    Assessment & Plan   1. Acute respiratory failure with hypoxia. Chest x-ray worse, will obtain echocardiogram of the heart continue antibiotics I will ask ID to see due to previous admission there was concern for PCP I will also pulmonary to see as well to see if she needs bronchoscopy 2. Hyponatremia. And she is to have low sodium I will ask nephrology to see to assist with her hyponatremia  3. Acute hospital delirium continues to have waxing and waning states that she was here last week 4. HIV. CD4 count 87 which would be consistent with  AIDS. Appreciate ID consultation and continue haart treatment. 5. Increased liver function tests. continue to monitor 6. Essential hypertension on hydrochlorothiazide and ramipril 7. Hypokalemia continued to report 8. Depression on amitriptyline 9.   Rib pain- could be costochondritis. 10. Weakness. Strongly recommend rehabilitation as per previous admission 11. Type 2 diabetes mellitus. Hemoglobin A1c 7.6. On sliding scale for now.      Code Status Orders        Start     Ordered   2016-07-23 1952  Full code  Continuous     Jul 23, 2016 1953    Code Status History    Date Active Date Inactive Code Status Order ID Comments User Context   07/04/2016  6:31 PM 07/13/2016  7:05 PM Full Code [id number]  Houston Siren, MD Inpatient           Consults  ID   DVT Prophylaxis  Lovenox   Lab  Results  Component Value Date   PLT 193 07/16/2016     Time Spent in minutes    Greater than 50% of time spent in care coordination and counseling patient regarding the condition and plan of care.   Auburn Bilberry M.D on 07/16/2016 at 1:20 PM  Between 7am to 6pm - Pager - [phone]  After 6pm go to [email or web address] - password [email or web address] [facility]  [facility]   Office  [phone]

## 2016-07-16 NOTE — Consult Note (Signed)
[facility] CONSULT NOTE    Date: 07/16/2016                  Patient Name:  Chassie Pennix  MRN: [id number]  DOB: April 11, 1950  Age / Sex: 66 y.o., female         PCP: [facility]                 Service Requesting Consult: Hospitalist                 Reason for Consult: Hyponatremia            History of Present Illness: Patient is a 66 y.o. female with a PMHx of HIV, hepatitis C, crack cocaine dependency, recent PE JP lung infection, hypertension, peripheral neuropathy, GERD who was admitted to [facility] on 07/22/2016 for evaluation of  shortness of breath. The patient was recently here with similar symptoms and was discharged on Bactrim, prednisone, and oral antibiotics. The patient was to continue steroids for total of 21 days as well as Bactrim.  In addition she appears to have worsening hyponatremia. Serum sodium currently 122 despite IV fluid hydration suggesting underlying SIADH. Patient also has history of hepatitis C with liver dysfunction as well. She reports that she's had both cough and shortness of breath. She does intake approximately 1500 cc of water per day.   Medications: Outpatient medications: Prescriptions Prior to Admission  Medication Sig Dispense Refill Last Dose  . amitriptyline (ELAVIL) 25 MG tablet Take 25 mg by mouth at bedtime.   07/14/2016 at pm  . cefUROXime (CEFTIN) 500 MG tablet Take 1 tablet (500 mg total) by mouth 2 (two) times daily with a meal. 14 tablet 0 08/01/2016 at am  . darunavir-cobicistat (PREZCOBIX) 800-150 MG tablet Take 1 tablet by mouth daily with breakfast. Swallow whole. Do NOT crush, break or chew tablets. Take with food.   07/25/2016 at am  . dolutegravir (TIVICAY) 50 MG tablet Take 50 mg by mouth daily.   07/18/2016 at am  . ipratropium-albuterol (DUONEB) 0.5-2.5 (3) MG/3ML SOLN Take 3 mLs by nebulization 3 (three) times daily. 360 mL 0 07/23/2016 at am  . predniSONE (DELTASONE) 20 MG tablet Take 2  tablets (40 mg total) by mouth 2 (two) times daily with a meal.   08/04/2016 at am  . QUEtiapine (SEROQUEL) 25 MG tablet Take 1 tablet (25 mg total) by mouth at bedtime. 30 tablet 0 07/14/2016 at pm  . ramipril (ALTACE) 10 MG capsule Take 10 mg by mouth every evening.   07/14/2016 at pm  . sulfamethoxazole-trimethoprim (BACTRIM DS,SEPTRA DS) 800-160 MG tablet Take 2 tablets by mouth 3 (three) times daily. 114 tablet 0 07/17/2016 at am    Current medications: Current Facility-Administered Medications  Medication Dose Route Frequency Provider Last Rate Last Dose  . 0.9 %  sodium chloride infusion   Intravenous Continuous Katha Hamming, MD 75 mL/hr at 07/16/16 1252    . acetaminophen (TYLENOL) tablet 650 mg  650 mg Oral Q6H PRN Katha Hamming, MD       Or  . acetaminophen (TYLENOL) suppository 650 mg  650 mg Rectal Q6H PRN Katha Hamming, MD      . amitriptyline (ELAVIL) tablet 25 mg  25 mg Oral QHS Katha Hamming, MD   25 mg at 07/17/2016 2321  . bictegravir-emtricitabine-tenofovir AF (BIKTARVY) 50-200-25 MG per tablet 1 tablet  1 tablet Oral Daily Katha Hamming, MD   1 tablet at 07/16/16 0942  .  bisacodyl (DULCOLAX) EC tablet 5 mg  5 mg Oral Daily PRN Katha Hamming, MD      . ceFEPIme (MAXIPIME) 2 g in dextrose 5 % 50 mL IVPB  2 g Intravenous Q12H Rolm Baptise, [place]   Stopped at 07/16/16 4098  . darunavir-cobicistat (PREZCOBIX) 800-150 MG per tablet 1 tablet  1 tablet Oral Q breakfast Katha Hamming, MD   1 tablet at 07/16/16 0941  . docusate sodium (COLACE) capsule 100 mg  100 mg Oral BID Katha Hamming, MD   100 mg at 07/16/16 0941  . dolutegravir (TIVICAY) tablet 50 mg  50 mg Oral Daily Katha Hamming, MD   50 mg at 07/16/16 0943  . fluconazole (DIFLUCAN) IVPB 100 mg  100 mg Intravenous Daily Auburn Bilberry, MD 50 mL/hr at 07/16/16 1246 100 mg at 07/16/16 1246  . heparin injection 5,000 Units  5,000 Units Subcutaneous Q8H Katha Hamming,  MD   5,000 Units at 07/16/16 0944  . ipratropium-albuterol (DUONEB) 0.5-2.5 (3) MG/3ML nebulizer solution 3 mL  3 mL Nebulization TID Katha Hamming, MD   3 mL at 07/16/16 0741  . ondansetron (ZOFRAN) tablet 4 mg  4 mg Oral Q6H PRN Katha Hamming, MD       Or  . ondansetron (ZOFRAN) injection 4 mg  4 mg Intravenous Q6H PRN Katha Hamming, MD      . predniSONE (DELTASONE) tablet 40 mg  40 mg Oral BID WC Katha Hamming, MD   40 mg at 07/16/16 0942  . QUEtiapine (SEROQUEL) tablet 25 mg  25 mg Oral QHS Katha Hamming, MD   25 mg at 07/31/2016 2321  . ramipril (ALTACE) capsule 10 mg  10 mg Oral QPM Katha Hamming, MD   10 mg at 08/07/2016 2321  . sulfamethoxazole-trimethoprim (BACTRIM DS,SEPTRA DS) 800-160 MG per tablet 2 tablet  2 tablet Oral TID Katha Hamming, MD   2 tablet at 07/16/16 0939  . traZODone (DESYREL) tablet 25 mg  25 mg Oral QHS PRN Katha Hamming, MD      . vancomycin (VANCOCIN) IVPB 1000 mg/200 mL premix  1,000 mg Intravenous Q18H Rolm Baptise, RPH   Stopped at 07/16/16 0030      Allergies: No Known Allergies    Past Medical History: Past Medical History:  Diagnosis Date  . HIV (human immunodeficiency virus infection) (HCC)   . Hypertension   HIV, hepatitis C, crack cocaine dependency, recent PE JP lung infection, hypertension, peripheral neuropathy, GERD   Past Surgical History: History reviewed. No pertinent surgical history.   Family History: Family History  Problem Relation Age of Onset  . Adopted: Yes     Social History: Social History   Social History  . Marital status: Single    Spouse name: N/A  . Number of children: N/A  . Years of education: N/A   Occupational History  . Not on file.   Social History Main Topics  . Smoking status: Never Smoker  . Smokeless tobacco: Never Used  . Alcohol use Yes  . Drug use: No  . Sexual activity: Not on file   Other Topics Concern  . Not on file   Social  History Narrative  . No narrative on file     Review of Systems: Review of Systems  Constitutional: Positive for malaise/fatigue. Negative for chills and fever.  HENT: Negative for congestion, hearing loss and nosebleeds.   Eyes: Negative for blurred vision and double vision.  Respiratory: Positive for cough and shortness of breath.  Cardiovascular: Negative for chest pain, palpitations and orthopnea.  Gastrointestinal: Negative for abdominal pain, heartburn, nausea and vomiting.  Genitourinary: Negative for dysuria, frequency and urgency.  Neurological: Negative for dizziness and focal weakness.  Endo/Heme/Allergies: Does not bruise/bleed easily.  Psychiatric/Behavioral: Positive for substance abuse. The patient is not nervous/anxious.      Vital Signs: Blood pressure (!) 130/98, pulse 84, temperature 98.4 F (36.9 C), temperature source Oral, resp. rate 18, height 5\' 1"  (1.549 m), weight 92.3 kg (203 lb 6.4 oz), SpO2 93 %.  Weight trends: Filed Weights   02/28/16 1506 02/28/16 2126  Weight: 96.2 kg (212 lb) 92.3 kg (203 lb 6.4 oz)    Physical Exam: General: NAD  Head: Normocephalic, atraumatic.  Eyes: Anicteric, EOMI  Nose: Mucous membranes moist, not inflammed, nonerythematous.  Throat: Oropharynx nonerythematous, no exudate appreciated.   Neck: Supple, trachea midline.  Lungs:  Normal respiratory effort. Scattered rhonchi  Heart: RRR. S1 and S2 normal without gallop, murmur, or rubs.  Abdomen:  BS normoactive. Soft, Nondistended, non-tender.  No masses or organomegaly.  Extremities: No pretibial edema.  Neurologic: A&O X3, Motor strength is 5/5 in the all 4 extremities  Skin: No visible rashes, scars.    Lab results: Basic Metabolic Panel:  Recent Labs Lab 02/28/16 2150 07/16/16 0153 07/16/16 0822  NA 123* 121* 122*  K 3.0* 3.3* 3.4*  CL 86* 86* 91*  CO2 27 25 22   GLUCOSE 132* 144* 131*  BUN 24* 23* 22*  CREATININE 1.16* 1.10* 0.92  CALCIUM 8.4* 8.4*  8.1*  MG  --   --  2.2    Liver Function Tests:  Recent Labs Lab 02/28/16 1505  AST 96*  ALT 100*  ALKPHOS 70  BILITOT 4.0*  PROT 7.8  ALBUMIN 3.3*   No results for input(s): LIPASE, AMYLASE in the last 168 hours. No results for input(s): AMMONIA in the last 168 hours.  CBC:  Recent Labs Lab 02/28/16 1505 07/16/16 0153  WBC 9.1 7.5  NEUTROABS 6.2  --   HGB 15.1 14.9  HCT 43.8 43.4  MCV 91.2 91.3  PLT 218 193    Cardiac Enzymes:  Recent Labs Lab 02/28/16 1505  TROPONINI <0.03    BNP: Invalid input(s): POCBNP  CBG:  Recent Labs Lab 07/12/16 2212 07/13/16 0752 07/13/16 1159 07/16/16 0548 07/16/16 0738  GLUCAP 174* 171* 184* 119* 128*    Microbiology: Results for orders placed or performed during the hospital encounter of 02/28/16  Blood culture (routine x 2)     Status: None (Preliminary result)   Collection Time: 02/28/16  4:56 PM  Result Value Ref Range Status   Specimen Description BLOOD LAC  Final   Special Requests   Final    BOTTLES DRAWN AEROBIC AND ANAEROBIC Blood Culture results may not be optimal due to an inadequate volume of blood received in culture bottles   Culture NO GROWTH < 24 HOURS  Final   Report Status PENDING  Incomplete  Blood culture (routine x 2)     Status: None (Preliminary result)   Collection Time: 02/28/16  4:56 PM  Result Value Ref Range Status   Specimen Description BLOOD R HAND  Final   Special Requests   Final    BOTTLES DRAWN AEROBIC AND ANAEROBIC Blood Culture results may not be optimal due to an inadequate volume of blood received in culture bottles   Culture NO GROWTH < 24 HOURS  Final   Report Status PENDING  Incomplete    Coagulation  Studies: No results for input(s): LABPROT, INR in the last 72 hours.  Urinalysis: No results for input(s): COLORURINE, LABSPEC, PHURINE, GLUCOSEU, HGBUR, BILIRUBINUR, KETONESUR, PROTEINUR, UROBILINOGEN, NITRITE, LEUKOCYTESUR in the last 72 hours.  Invalid input(s):  APPERANCEUR    Imaging: Dg Chest 2 View  Result Date: 07/24/2016 CLINICAL DATA:  Recently hospitalized with pneumonia. Patient returns with shortness of breath. History of hypertension and HIV. EXAM: CHEST  2 VIEW COMPARISON:  Chest x-ray of Jul 09, 2016 FINDINGS: The lungs are reasonably well inflated. There is a markings are increased. There is are areas of atelectasis or linear infiltrate at the lung bases similar to that seen on May 31st. There is no large pleural effusion. The cardiac silhouette is mildly enlarged. There is calcification in the wall of the thoracic aorta. There is multilevel degenerative disc disease of the thoracic spine. IMPRESSION: Bilateral interstitial pneumonia greatest at the lung bases. No CHF. The appearance of the chest has deteriorated since the PA and lateral chest x-ray of Jul 04, 2016 and is slightly worse than on the the Jul 09, 2016 study. Electronically Signed   By: David  [place] M.D.   On: 07/23/2016 16:07   Ct Abdomen Pelvis W Contrast  Result Date: 07/29/2016 CLINICAL DATA:  Acute right lower quadrant pain. EXAM: CT ABDOMEN AND PELVIS WITH CONTRAST TECHNIQUE: Multidetector CT imaging of the abdomen and pelvis was performed using the standard protocol following bolus administration of intravenous contrast. CONTRAST:  ISOVUE-300 IOPAMIDOL (ISOVUE-300) INJECTION 61% COMPARISON:  01/24/2013 FINDINGS: Lower chest: Cardiomegaly. Subsegmental atelectasis or scarring at the bases. Hepatobiliary: No focal liver abnormality.Cholelithiasis. No evidence of acute cholecystitis or biliary obstruction Pancreas: Unremarkable. Spleen: Unremarkable. Adrenals/Urinary Tract: Negative adrenals. No hydronephrosis or stone. 18 mm simple appearing left renal cyst. Unremarkable bladder. Stomach/Bowel: No obstruction. Moderate colonic gas and stool. No pericecal or other inflammation. Vascular/Lymphatic: Narrow appearance of the main portal vein that is chronic. 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 No mass or adenopathy. Reproductive:Hysterectomy and possible right oophorectomy. There is a left adnexal cyst measuring 4.1 cm. Enhancing tissue along the left aspect of the wall could be residual ovary. This cyst had internal complex material in 2014 pelvic sonography. This mass was also evaluated at [facility] per sonographic report May 2011, when complexity was also noted at that time. Other: No ascites or pneumoperitoneum. Musculoskeletal: Spondylosis and facet arthropathy. No acute or aggressive finding. IMPRESSION: 1. No acute finding. 2. Cholelithiasis without signs of cholecystitis. 3. 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 4. Chronic left adnexal cyst measuring up to 4.1 cm, size stable from 2009. Recommend yearly pelvic sonography. Electronically Signed   By: Marnee Spring M.D.   On: 07/11/2016 16:58   US Abdomen Limited Ruq  Result Date: 07/11/2016 CLINICAL DATA:  Hyperbilirubinemia.  Known cholelithiasis. EXAM: ULTRASOUND ABDOMEN LIMITED RIGHT UPPER QUADRANT COMPARISON:  CT abdomen pelvis performed earlier today, 01/24/2013 and earlier. Right upper quadrant abdominal ultrasound 02/01/2013, 01/24/2013 and earlier. FINDINGS: Gallbladder: Multiple echogenic gallstones, the largest on the order of 10 mm. No gallbladder wall thickening or pericholecystic fluid. Negative sonographic Murphy's sign according to the ultrasound technologist. Common bile duct: Diameter: Approximately 5 mm proximally. No visible stones in the proximal duct. The mid and distal duct are obscured by duodenal bowel gas. Liver: Scattered geographic areas of increased parenchymal echotexture. No parenchymal masses. Patent though narrowed main portal vein with hepatopetal flow (as noted on the earlier CT).  IMPRESSION: 1. Cholelithiasis without sonographic evidence of acute cholecystitis. 2. No evidence of biliary ductal dilation. 3. Scattered  areas of focal hepatic steatosis. No hepatic masses. Narrowed main portal vein with hepatopetal flow (as noted on the earlier CT). Electronically Signed   By: Hulan Saas M.D.   On: 2016/08/13 19:13      Assessment & Plan: Pt is a 66 y.o. female with a PMHX of HIV, hepatitis C, crack cocaine dependency, recent PE JP lung infection, hypertension, peripheral neuropathy, GERD.  1.  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 2.  Hypokalemia. 3.  Hypertension. 4.  HIV, uncontrolled.  5.  Hepatitis C with liver dysfunction.  Plan:  We will consulted for the evaluation management of hyponatremia.  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 We will send off serum osmolality, urine osmolality, TSH, cortisol, and uric acid. Ideally we would've liked to have administered and ADH antagonist however the patient does have liver dysfunction. Therefore we will avoid this for now. We will attempt a trial of fluid restriction, salt tablets, and Lasix for now. If this does not work we may need to consider 3% saline. However I like to avoid placing a line at the moment given her immunocompromised status. Further plan as patient progresses.

## 2016-07-17 ENCOUNTER — Inpatient Hospital Stay: Payer: Medicaid Other

## 2016-07-17 DIAGNOSIS — R200 Anesthesia of skin: Secondary | ICD-10-CM

## 2016-07-17 DIAGNOSIS — J9601 Acute respiratory failure with hypoxia: Secondary | ICD-10-CM

## 2016-07-17 DIAGNOSIS — B20 Human immunodeficiency virus [HIV] disease: Secondary | ICD-10-CM

## 2016-07-17 DIAGNOSIS — R918 Other nonspecific abnormal finding of lung field: Secondary | ICD-10-CM

## 2016-07-17 LAB — VITAMIN B12: VITAMIN B 12: 562 pg/mL (ref 180–914)

## 2016-07-17 LAB — ECHOCARDIOGRAM COMPLETE
Height: 61 in
Weight: 3254.4 oz

## 2016-07-17 LAB — AMMONIA: AMMONIA: 61 umol/L — AB (ref 9–35)

## 2016-07-17 LAB — SODIUM
Sodium: 124 mmol/L — ABNORMAL LOW (ref 135–145)
Sodium: 125 mmol/L — ABNORMAL LOW (ref 135–145)
Sodium: 126 mmol/L — ABNORMAL LOW (ref 135–145)
Sodium: 127 mmol/L — ABNORMAL LOW (ref 135–145)

## 2016-07-17 MED ORDER — VANCOMYCIN HCL IN DEXTROSE 750-5 MG/150ML-% IV SOLN
750.0000 mg | Freq: Two times a day (BID) | INTRAVENOUS | Status: DC
  Administered 2016-07-17: 11:00:00 750 mg via INTRAVENOUS
  Filled 2016-07-17 (×2): qty 150

## 2016-07-17 MED ORDER — SULFAMETHOXAZOLE-TRIMETHOPRIM 400-80 MG/5ML IV SOLN
320.0000 mg | Freq: Three times a day (TID) | INTRAVENOUS | Status: DC
  Administered 2016-07-18 (×3): 320 mg via INTRAVENOUS
  Filled 2016-07-17 (×7): qty 20

## 2016-07-17 MED ORDER — ASPIRIN EC 81 MG PO TBEC: 81.0000 mg | DELAYED_RELEASE_TABLET | Freq: Every day | ORAL | Status: DC

## 2016-07-17 MED ORDER — DEXTROSE 5 % IV SOLN
2.0000 g | Freq: Three times a day (TID) | INTRAVENOUS | Status: DC
  Filled 2016-07-17 (×2): qty 2

## 2016-07-17 MED ORDER — VITAMIN B-1 100 MG PO TABS
100.0000 mg | ORAL_TABLET | Freq: Every day | ORAL | Status: DC
Start: 2016-07-17 — End: 2016-07-18

## 2016-07-17 MED ORDER — IPRATROPIUM-ALBUTEROL 0.5-2.5 (3) MG/3ML IN SOLN
3.0000 mL | Freq: Four times a day (QID) | RESPIRATORY_TRACT | Status: DC
  Administered 2016-07-17 – 2016-07-18 (×3): 3 mL via RESPIRATORY_TRACT
  Filled 2016-07-17 (×3): qty 3

## 2016-07-17 MED ORDER — THIAMINE HCL 100 MG/ML IJ SOLN
100.0000 mg | Freq: Every day | INTRAMUSCULAR | Status: DC
  Administered 2016-07-17: 100 mg via INTRAVENOUS
  Filled 2016-07-17: qty 2

## 2016-07-17 MED ORDER — LORAZEPAM 0.5 MG PO TABS: 1.0000 mg | ORAL_TABLET | Freq: Four times a day (QID) | ORAL | Status: DC | PRN

## 2016-07-17 MED ORDER — LORAZEPAM 2 MG/ML IJ SOLN
1.0000 mg | Freq: Once | INTRAMUSCULAR | Status: AC
  Administered 2016-07-17: 1 mg via INTRAVENOUS
  Filled 2016-07-17: qty 1

## 2016-07-17 MED ORDER — FOLIC ACID 1 MG PO TABS: 1.0000 mg | ORAL_TABLET | Freq: Every day | ORAL | Status: DC

## 2016-07-17 MED ORDER — SULFAMETHOXAZOLE-TRIMETHOPRIM 400-80 MG/5ML IV SOLN
320.0000 mg | Freq: Three times a day (TID) | INTRAVENOUS | Status: DC
  Filled 2016-07-17 (×3): qty 20

## 2016-07-17 MED ORDER — ORAL CARE MOUTH RINSE
15.0000 mL | Freq: Two times a day (BID) | OROMUCOSAL | Status: DC
  Administered 2016-07-17 – 2016-07-18 (×2): 15 mL via OROMUCOSAL

## 2016-07-17 MED ORDER — ADULT MULTIVITAMIN W/MINERALS CH: 1.0000 | ORAL_TABLET | Freq: Every day | ORAL | Status: DC

## 2016-07-17 MED ORDER — LORAZEPAM 2 MG/ML IJ SOLN
1.0000 mg | Freq: Four times a day (QID) | INTRAMUSCULAR | Status: DC | PRN
  Administered 2016-07-17 – 2016-07-18 (×2): 1 mg via INTRAVENOUS
  Filled 2016-07-17 (×2): qty 1

## 2016-07-17 NOTE — Consult Note (Signed)
Name: [name] MRN: [id number] DOB: 07-Jun-1950    ADMISSION DATE:  08/13/2016 CONSULTATION DATE: 07/16/2016  REFERRING MD : Dr. Allena Katz   CHIEF COMPLAINT: Shortness of Breath and Respiratory Failure   BRIEF PATIENT DESCRIPTION: 66 yo female admitted 06/6 with acute on chronic hyponatremia, abdominal pain, acute hypoxic respiratory failure secondary to presumed PCP, atelectasis and crack cocaine abuse   SIGNIFICANT EVENTS: 06/6-Pt admitted to medsurg unit    STUDIES:  [place] Abd Limited 06/6>>Cholelithiasis without sonographic evidence of acute cholecystitis. No evidence of biliary ductal dilation. Scattered areas of focal hepatic steatosis. No hepatic masses. Narrowed main portal vein with hepatopetal flow (as noted on the earlier CT). CT Abdomen and Pelvis 06/6>>No acute finding. Cholelithiasis without signs of cholecystitis. 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 Chronic left adnexal cyst measuring up to 4.1 cm, size stable from 2009. Recommend yearly pelvic sonography. Echo 06/7>>EF 55% to 60%  HISTORY OF PRESENT ILLNESS:   This is a 66 yo female with a PMH of HIV, HTN, ETOH Abuse, Peripheral Neuropathy, Crack Cocaine Abuse, Hepatitis C with Liver Dysfunction, and GERD.  She presented to [facility] ER via EMS 06/6 with abdominal pain and shortness of breath.  She was recently discharged from [facility] 06/4 due to acute hypoxic respiratory failure secondary to presumed PCP with the following recommended treatment by infectious disease bactrim for 21 days and prednisone for 21 days.  Upon arrival to the ER 06/6 pt required 2L O2 via nasal canula, was confused with Na level of 121, and cxr revealing worsening pneumonia.  PCCM consulted for additional recommendations 06/7.  PAST MEDICAL HISTORY :   has a past medical history of HIV (human immunodeficiency virus infection) (HCC) and Hypertension.  has no past surgical history on file. Prior to Admission  medications   Medication Sig Start Date End Date Taking? Authorizing Provider  amitriptyline (ELAVIL) 25 MG tablet Take 25 mg by mouth at bedtime.   Yes [provider]  bictegravir-emtricitabine-tenofovir AF (BIKTARVY) 50-200-25 MG TABS tablet Take 1 tablet by mouth daily.   Yes [provider]  cefUROXime (CEFTIN) 500 MG tablet Take 1 tablet (500 mg total) by mouth 2 (two) times daily with a meal. 07/13/16 07/20/16 Yes Auburn Bilberry, MD  darunavir-cobicistat (PREZCOBIX) 800-150 MG tablet Take 1 tablet by mouth daily with breakfast. Swallow whole. Do NOT crush, break or chew tablets. Take with food.   Yes [provider]  ipratropium-albuterol (DUONEB) 0.5-2.5 (3) MG/3ML SOLN Take 3 mLs by nebulization 3 (three) times daily. 07/13/16  Yes Auburn Bilberry, MD  predniSONE (DELTASONE) 20 MG tablet Take 2 tablets (40 mg total) by mouth 2 (two) times daily with a meal. 07/13/16  Yes Auburn Bilberry, MD  QUEtiapine (SEROQUEL) 25 MG tablet Take 1 tablet (25 mg total) by mouth at bedtime. 07/13/16  Yes Auburn Bilberry, MD  ramipril (ALTACE) 10 MG capsule Take 10 mg by mouth every evening.   Yes [provider]  sulfamethoxazole-trimethoprim (BACTRIM DS,SEPTRA DS) 800-160 MG tablet Take 2 tablets by mouth 3 (three) times daily. 07/13/16 08/01/16 Yes Auburn Bilberry, MD   No Known Allergies  FAMILY HISTORY:  family history is not on file. She was adopted. SOCIAL HISTORY:  reports that she has never smoked. She has never used smokeless tobacco. She reports that she drinks alcohol. She reports that she does not use drugs.  REVIEW OF SYSTEMS:   Unable to assess pt delirious   SUBJECTIVE:  Pt  delirious   VITAL SIGNS: Temp:  [98 F (36.7 C)-98.9 F (37.2 C)] 98 F (36.7 C) (06/08 1256) Pulse Rate:  [95-96] 95 (06/08 1256) Resp:  [18-22] 18 (06/08 1256) BP: (105-123)/(56-92) 120/56 (06/08 1256) SpO2:  [93 %-98 %] 96 % (06/08 1256) Weight:  [90.9 kg (200 lb 8 oz)] 90.9 kg  (200 lb 8 oz) (06/08 0618)  PHYSICAL EXAMINATION: General: acutely ill appearing AA female thrashing in bed  Neuro: agitated, delirious, following commands, PERRL  HEENT: supple, no JVD,  Cardiovascular: rrr, no M/R/G Lungs: diminished throughout, even, non labored on RA  Abdomen: hypoactive BS x4, soft, tender, obese, non distended  Musculoskeletal: normal bulk and tone, no edema  Skin: intact no rashes or lesions   Recent Labs Lab 07/16/16 0153 07/16/16 0822 07/16/16 1457  07/17/16 0144 07/17/16 0817 07/17/16 1403  NA 121* 122* 123*  124*  < > 124* 125* 126*  K 3.3* 3.4* 3.8  --   --   --   --   CL 86* 91* 88*  --   --   --   --   CO2 25 22 25   --   --   --   --   BUN 23* 22* 19  --   --   --   --   CREATININE 1.10* 0.92 0.81  --   --   --   --   GLUCOSE 144* 131* 209*  --   --   --   --   < > = values in this interval not displayed.  Recent Labs Lab 08-05-2016 1505 07/16/16 0153  HGB 15.1 14.9  HCT 43.8 43.4  WBC 9.1 7.5  PLT 218 193   Ct Abdomen Pelvis W Contrast  Result Date: 08-05-16 CLINICAL DATA:  Acute right lower quadrant pain. EXAM: CT ABDOMEN AND PELVIS WITH CONTRAST TECHNIQUE: Multidetector CT imaging of the abdomen and pelvis was performed using the standard protocol following bolus administration of intravenous contrast. CONTRAST:  ISOVUE-300 IOPAMIDOL (ISOVUE-300) INJECTION 61% COMPARISON:  01/24/2013 FINDINGS: Lower chest: Cardiomegaly. Subsegmental atelectasis or scarring at the bases. Hepatobiliary: No focal liver abnormality.Cholelithiasis. No evidence of acute cholecystitis or biliary obstruction Pancreas: Unremarkable. Spleen: Unremarkable. Adrenals/Urinary Tract: Negative adrenals. No hydronephrosis or stone. 18 mm simple appearing left renal cyst. Unremarkable bladder. Stomach/Bowel: No obstruction. Moderate colonic gas and stool. No pericecal or other inflammation. Vascular/Lymphatic: Narrow appearance of the main portal vein that is chronic.  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 No mass or adenopathy. Reproductive:Hysterectomy and possible right oophorectomy. There is a left adnexal cyst measuring 4.1 cm. Enhancing tissue along the left aspect of the wall could be residual ovary. This cyst had internal complex material in 2014 pelvic sonography. This mass was also evaluated at [facility] per sonographic report May 2011, when complexity was also noted at that time. Other: No ascites or pneumoperitoneum. Musculoskeletal: Spondylosis and facet arthropathy. No acute or aggressive finding. IMPRESSION: 1. No acute finding. 2. Cholelithiasis without signs of cholecystitis. 3. 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 4. Chronic left adnexal cyst measuring up to 4.1 cm, size stable from 2009. Recommend yearly pelvic sonography. Electronically Signed   By: Marnee Spring M.D.   On: Aug 05, 2016 16:58   US Abdomen Limited Ruq  Result Date: 08/05/2016 CLINICAL DATA:  Hyperbilirubinemia.  Known cholelithiasis. EXAM: ULTRASOUND ABDOMEN LIMITED RIGHT UPPER QUADRANT COMPARISON:  CT abdomen pelvis performed earlier today, 01/24/2013 and  earlier. Right upper quadrant abdominal ultrasound 02/01/2013, 01/24/2013 and earlier. FINDINGS: Gallbladder: Multiple echogenic gallstones, the largest on the order of 10 mm. No gallbladder wall thickening or pericholecystic fluid. Negative sonographic Murphy's sign according to the ultrasound technologist. Common bile duct: Diameter: Approximately 5 mm proximally. No visible stones in the proximal duct. The mid and distal duct are obscured by duodenal bowel gas. Liver: Scattered geographic areas of increased parenchymal echotexture. No parenchymal masses. Patent though narrowed main portal vein with hepatopetal flow (as noted on the earlier CT). IMPRESSION: 1. Cholelithiasis without sonographic evidence of acute cholecystitis. 2. No evidence of biliary ductal  dilation. 3. Scattered areas of focal hepatic steatosis. No hepatic masses. Narrowed main portal vein with hepatopetal flow (as noted on the earlier CT). Electronically Signed   By: Hulan Saashomas  Lawrence M.D.   On: 2016/03/04 19:13    ASSESSMENT / PLAN: Acute hypoxic respiratory failure secondary to presumed PCP, atelectasis, and crack cocaine abuse P: Supplemental O2 to maintain O2 sats >92% Intermittent cxr  Continue abx and steroids per infectious disease recommendations  Continue scheduled bronchodilator therapy  Trend WBC and monitor fever curve  Follow cultures  Early ambulation Pulmonary hygiene once mentation improves  Will need repeat cxr in outpatient setting in 6 weeks  Pt will need extensive cessation counseling regarding crack cocaine abuse once mentation improves this is likely the primary contributing factor to persistent respiratory failure   -PCCM will sign off 07/17/16 if you need further assistance please call on call pager # listed in Amion.  Thank you for the consultation.  Sonda Rumbleana Blakeney, AGNP  Pulmonary/Critical Care Pager [phone] (please enter 7 digits) PCCM Consult Pager [phone] (please enter 7 digits)   PCCM ATTENDING ATTESTATION:  I have evaluated patient with the APP Blakeney, reviewed database in its entirety and discussed care plan in detail. In addition, this patient was discussed on multidisciplinary rounds.   Her hypoxic respiratory failure and bibasilar infiltrates are of unclear etiology but appear to be improving. It is unlikely that they represent pneumocystis as she was (reportedly) already being treated for this. I am told that she continues to smoke crack cocaine. This could be related to that as her cocaine metabolites were positive on admission. At the present time, she is on room air without extreme respiratory distress. She does appear to be withdrawing from something. From a pulmonary point of view, she does not warrant any further  evaluation or treatment other than her current therapies. A repeat chest x-ray should be performed in 4-6 weeks after discharge to ensure resolution of pulmonary infiltrates. She needs to abstain from smoking cigarettes, cocaine, marijuana and anything else that might be contributing to lung injury   Billy Fischeravid Bryndle Corredor, MD PCCM service Mobile [phone] Pager [phone] 07/17/2016 5:07 PM

## 2016-07-17 NOTE — Progress Notes (Signed)
Pt with possible etoh withdrawal start CIWA.

## 2016-07-17 NOTE — Care Management (Signed)
Admitted to this facility with the diagnosis acute respiratory failure. Discharged from this facility 07/13/16. Lives alone. Boyfriend, Hessie KnowsOtis is in the home at night. Caregiver is Ardeen JourdainLatoyna Hi cks [phone]), works for [employer]. [facility].  Last discharge of 07/13/16 Home oxygen, Nebulizer, and rolling walker arranged per [facility]. Sodium level 124 today. Infection Disease consult in progress. IV Rocephin continues. Serena CroissantBrenda S Carmelia Tiner RN MSN CCM Care Management [phone]

## 2016-07-17 NOTE — Consult Note (Signed)
Reason for Consult:AMS Referring Physician: Allena Katz  CC: AMS  HPI: [name] is an 66 y.o. female with a known history of HIV on antiretrovirals, who was discharged from hospital on 6/4 treated for pneumonia (possible PCP) with Bactrim and steroids.  Came back 6/6 with worsening shortness of breath, cough, abdominal pain.  Plan was to continue steroids for total of 21 days and also 21 days Bactrim. Patient was somewhat altered at discharge but and on return was further altered.  Mental status not at baseline and consult called for further recommendations.    Past Medical History:  Diagnosis Date  . HIV (human immunodeficiency virus infection) (HCC)   . Hypertension     History reviewed. No pertinent surgical history.  Family History  Problem Relation Age of Onset  . Adopted: Yes    Social History:  reports that she has never smoked. She has never used smokeless tobacco. She reports that she drinks alcohol. She reports that she does not use drug but presented with a positive cocaine UDS  No Known Allergies  Medications:  I have reviewed the patient's current medications. Prior to Admission:  Prescriptions Prior to Admission  Medication Sig Dispense Refill Last Dose  . amitriptyline (ELAVIL) 25 MG tablet Take 25 mg by mouth at bedtime.   07/14/2016 at pm  . bictegravir-emtricitabine-tenofovir AF (BIKTARVY) 50-200-25 MG TABS tablet Take 1 tablet by mouth daily.     . cefUROXime (CEFTIN) 500 MG tablet Take 1 tablet (500 mg total) by mouth 2 (two) times daily with a meal. 14 tablet 0 2016/08/12 at am  . darunavir-cobicistat (PREZCOBIX) 800-150 MG tablet Take 1 tablet by mouth daily with breakfast. Swallow whole. Do NOT crush, break or chew tablets. Take with food.   08-12-2016 at am  . ipratropium-albuterol (DUONEB) 0.5-2.5 (3) MG/3ML SOLN Take 3 mLs by nebulization 3 (three) times daily. 360 mL 0 2016/08/12 at am  . predniSONE (DELTASONE) 20 MG tablet Take 2 tablets (40 mg total) by mouth 2  (two) times daily with a meal.   08/12/16 at am  . QUEtiapine (SEROQUEL) 25 MG tablet Take 1 tablet (25 mg total) by mouth at bedtime. 30 tablet 0 07/14/2016 at pm  . ramipril (ALTACE) 10 MG capsule Take 10 mg by mouth every evening.   07/14/2016 at pm  . sulfamethoxazole-trimethoprim (BACTRIM DS,SEPTRA DS) 800-160 MG tablet Take 2 tablets by mouth 3 (three) times daily. 114 tablet 0 2016-08-12 at am   Scheduled: . bictegravir-emtricitabine-tenofovir AF  1 tablet Oral Daily  . darunavir-cobicistat  1 tablet Oral Q breakfast  . docusate  100 mg Oral BID  . furosemide  40 mg Oral Daily  . heparin  5,000 Units Subcutaneous Q8H  . ipratropium-albuterol  3 mL Nebulization TID  . [START ON 07/18/2016] predniSONE  40 mg Oral Q breakfast   Followed by  . [START ON 07/23/2016] predniSONE  20 mg Oral Q breakfast  . predniSONE  40 mg Oral BID WC  . ramipril  10 mg Oral QPM  . sodium chloride  1 g Oral BID WC  . sulfamethoxazole-trimethoprim  2 tablet Oral TID    ROS: History obtained from the patient  General ROS: negative for - chills, fatigue, fever, night sweats, weight gain or weight loss Psychological ROS: negative for - behavioral disorder, hallucinations, memory difficulties, mood swings or suicidal ideation Ophthalmic ROS: negative for - blurry vision, double vision, eye pain or loss of vision ENT ROS: negative for - epistaxis, nasal discharge, oral  lesions, sore throat, tinnitus or vertigo Allergy and Immunology ROS: negative for - hives or itchy/watery eyes Hematological and Lymphatic ROS: negative for - bleeding problems, bruising or swollen lymph nodes Endocrine ROS: negative for - galactorrhea, hair pattern changes, polydipsia/polyuria or temperature intolerance Respiratory ROS: shortness of breath, cough Cardiovascular ROS: negative for - chest pain, dyspnea on exertion, edema or irregular heartbeat Gastrointestinal ROS: abdominal pain Genito-Urinary ROS: negative for - dysuria,  hematuria, incontinence or urinary frequency/urgency Musculoskeletal ROS: negative for - joint swelling or muscular weakness Neurological ROS: as noted in HPI, dysarthric from childhood Dermatological ROS: negative for rash and skin lesion changes  Physical Examination: Blood pressure (!) 120/56, pulse 95, temperature 98 F (36.7 C), temperature source Oral, resp. rate 18, height 5\' 1"  (1.549 m), weight 90.9 kg (200 lb 8 oz), SpO2 96 %.  Gen: NAD HEENT-  Normocephalic, no lesions, without obvious abnormality.  Normal external eye and conjunctiva.  Normal TM's bilaterally.  Normal auditory canals and external ears. Normal external nose, mucus membranes and septum.  Normal pharynx. Cardiovascular- S1, S2 normal, pulses palpable throughout   Lungs- chest clear, no wheezing, rales, normal symmetric air entry Abdomen- soft, non-tender; bowel sounds normal; no masses,  no organomegaly Extremities- no edema Lymph-no adenopathy palpable Musculoskeletal-no joint tenderness, deformity or swelling Skin-warm and dry, no hyperpigmentation, vitiligo, or suspicious lesions  Neurological Examination   Mental Status: Alert, oriented to name, place, year.  Reports it is July and Sunday.  Speech fluent but markedly dysarthric.  Able to follow 3 step commands without difficulty. Cranial Nerves: II: Discs flat bilaterally; Visual fields grossly normal, pupils equal, round, reactive to light and accommodation III,IV, VI: ptosis not present, extra-ocular motions intact bilaterally V,VII: smile symmetric, facial light touch sensation decreased on the left VIII: hearing normal bilaterally IX,X: gag reflex present XI: bilateral shoulder shrug XII: midline tongue extension Motor: Right : Upper extremity   5/5    Left:     Upper extremity   5/5  Lower extremity   5/5     Lower extremity   5/5 Tone and bulk:normal tone throughout; no atrophy noted Sensory: Pinprick and light touch decreased on the left Deep  Tendon Reflexes: 1+ and symmetric with absent AJ's bilaterally Plantars: Right: mute   Left: mute Cerebellar: Normal finger-to-nose and normal heel-to-shin testing bilaterally Gait: Patient able to stand and take a few steps unassisted   Laboratory Studies:   Basic Metabolic Panel:  Recent Labs Lab 2016/08/03 1505 2016/08/03 2150 07/16/16 0153 07/16/16 [id number] 07/16/16 1457 07/16/16 2018 07/17/16 0144 07/17/16 0817  NA 122* 123* 121* 122* 123*  124* 122* 124* 125*  K 3.5 3.0* 3.3* 3.4* 3.8  --   --   --   CL 87* 86* 86* 91* 88*  --   --   --   CO2 24 27 25 22 25   --   --   --   GLUCOSE 136* 132* 144* 131* 209*  --   --   --   BUN 24* 24* 23* 22* 19  --   --   --   CREATININE 1.07* 1.16* 1.10* 0.92 0.81  --   --   --   CALCIUM 8.8* 8.4* 8.4* 8.1* 8.5*  --   --   --   MG  --   --   --  2.2  --   --   --   --     Liver Function Tests:  Recent Labs Lab 2016/08/03 1505  AST 96*  ALT 100*  ALKPHOS 70  BILITOT 4.0*  PROT 7.8  ALBUMIN 3.3*   No results for input(s): LIPASE, AMYLASE in the last 168 hours. No results for input(s): AMMONIA in the last 168 hours.  CBC:  Recent Labs Lab 07-28-16 1505 07/16/16 0153  WBC 9.1 7.5  NEUTROABS 6.2  --   HGB 15.1 14.9  HCT 43.8 43.4  MCV 91.2 91.3  PLT 218 193    Cardiac Enzymes:  Recent Labs Lab 07-28-2016 1505  TROPONINI <0.03    BNP: Invalid input(s): POCBNP  CBG:  Recent Labs Lab 07/12/16 2212 07/13/16 0752 07/13/16 1159 07/16/16 0548 07/16/16 0738  GLUCAP 174* 171* 184* 119* 128*    Microbiology: Results for orders placed or performed during the hospital encounter of 07-28-2016  Blood culture (routine x 2)     Status: None (Preliminary result)   Collection Time: 2016-07-28  4:56 PM  Result Value Ref Range Status   Specimen Description BLOOD LAC  Final   Special Requests   Final    BOTTLES DRAWN AEROBIC AND ANAEROBIC Blood Culture results may not be optimal due to an inadequate volume of blood received in  culture bottles   Culture NO GROWTH 2 DAYS  Final   Report Status PENDING  Incomplete  Blood culture (routine x 2)     Status: None (Preliminary result)   Collection Time: July 28, 2016  4:56 PM  Result Value Ref Range Status   Specimen Description BLOOD R HAND  Final   Special Requests   Final    BOTTLES DRAWN AEROBIC AND ANAEROBIC Blood Culture results may not be optimal due to an inadequate volume of blood received in culture bottles   Culture  Setup Time   Final    Organism ID to follow GRAM POSITIVE COCCI IN BOTH AEROBIC AND ANAEROBIC BOTTLES CRITICAL RESULT CALLED TO, READ BACK BY AND VERIFIED WITH: NATE COOKSON AT 1558 ON 07/16/2016 JJB    Culture GRAM POSITIVE COCCI  Final   Report Status PENDING  Incomplete  Blood Culture ID Panel (Reflexed)     Status: Abnormal   Collection Time: July 28, 2016  4:56 PM  Result Value Ref Range Status   Enterococcus species NOT DETECTED NOT DETECTED Final   Listeria monocytogenes NOT DETECTED NOT DETECTED Final   Staphylococcus species DETECTED (A) NOT DETECTED Final    Comment: Methicillin (oxacillin) resistant coagulase negative staphylococcus. Possible blood culture contaminant (unless isolated from more than one blood culture draw or clinical case suggests pathogenicity). No antibiotic treatment is indicated for blood  culture contaminants. CRITICAL RESULT CALLED TO, READ BACK BY AND VERIFIED WITH: NATE COOKSON AT 1558 ON 07/16/2016 JJB    Staphylococcus aureus NOT DETECTED NOT DETECTED Final   Methicillin resistance DETECTED (A) NOT DETECTED Final    Comment: CRITICAL RESULT CALLED TO, READ BACK BY AND VERIFIED WITH: NATE COOKSON AT 1558 ON 07/16/2016 JJB    Streptococcus species NOT DETECTED NOT DETECTED Final   Streptococcus agalactiae NOT DETECTED NOT DETECTED Final   Streptococcus pneumoniae NOT DETECTED NOT DETECTED Final   Streptococcus pyogenes NOT DETECTED NOT DETECTED Final   Acinetobacter baumannii NOT DETECTED NOT DETECTED Final    Enterobacteriaceae species NOT DETECTED NOT DETECTED Final   Enterobacter cloacae complex NOT DETECTED NOT DETECTED Final   Escherichia coli NOT DETECTED NOT DETECTED Final   Klebsiella oxytoca NOT DETECTED NOT DETECTED Final   Klebsiella pneumoniae NOT DETECTED NOT DETECTED Final   Proteus species NOT DETECTED NOT DETECTED Final  Serratia marcescens NOT DETECTED NOT DETECTED Final   Haemophilus influenzae NOT DETECTED NOT DETECTED Final   Neisseria meningitidis NOT DETECTED NOT DETECTED Final   Pseudomonas aeruginosa NOT DETECTED NOT DETECTED Final   Candida albicans NOT DETECTED NOT DETECTED Final   Candida glabrata NOT DETECTED NOT DETECTED Final   Candida krusei NOT DETECTED NOT DETECTED Final   Candida parapsilosis NOT DETECTED NOT DETECTED Final   Candida tropicalis NOT DETECTED NOT DETECTED Final    Coagulation Studies: No results for input(s): LABPROT, INR in the last 72 hours.  Urinalysis: No results for input(s): COLORURINE, LABSPEC, PHURINE, GLUCOSEU, HGBUR, BILIRUBINUR, KETONESUR, PROTEINUR, UROBILINOGEN, NITRITE, LEUKOCYTESUR in the last 168 hours.  Invalid input(s): APPERANCEUR  Lipid Panel:  No results found for: CHOL, TRIG, HDL, CHOLHDL, VLDL, LDLCALC  HgbA1C:  Lab Results  Component Value Date   HGBA1C 7.6 (H) 07/09/2016    Urine Drug Screen:     Component Value Date/Time   LABOPIA NONE DETECTED 07/16/2016 1805   COCAINSCRNUR POSITIVE (A) 07/16/2016 1805   LABBENZ NONE DETECTED 07/16/2016 1805   AMPHETMU NONE DETECTED 07/16/2016 1805   THCU NONE DETECTED 07/16/2016 1805   LABBARB NONE DETECTED 07/16/2016 1805    Alcohol Level: No results for input(s): ETH in the last 168 hours.  Other results: EKG: sinus rhythm at 94 bpm.  Imaging: Dg Chest 2 View  Result Date: 07/22/2016 CLINICAL DATA:  Recently hospitalized with pneumonia. Patient returns with shortness of breath. History of hypertension and HIV. EXAM: CHEST  2 VIEW COMPARISON:  Chest x-ray of Jul 09, 2016 FINDINGS: The lungs are reasonably well inflated. There is a markings are increased. There is are areas of atelectasis or linear infiltrate at the lung bases similar to that seen on May 31st. There is no large pleural effusion. The cardiac silhouette is mildly enlarged. There is calcification in the wall of the thoracic aorta. There is multilevel degenerative disc disease of the thoracic spine. IMPRESSION: Bilateral interstitial pneumonia greatest at the lung bases. No CHF. The appearance of the chest has deteriorated since the PA and lateral chest x-ray of Jul 04, 2016 and is slightly worse than on the the Jul 09, 2016 study. Electronically Signed   By: David  [place] M.D.   On: 08/07/2016 16:07   Ct Abdomen Pelvis W Contrast  Result Date: 08/07/2016 CLINICAL DATA:  Acute right lower quadrant pain. EXAM: CT ABDOMEN AND PELVIS WITH CONTRAST TECHNIQUE: Multidetector CT imaging of the abdomen and pelvis was performed using the standard protocol following bolus administration of intravenous contrast. CONTRAST:  ISOVUE-300 IOPAMIDOL (ISOVUE-300) INJECTION 61% COMPARISON:  01/24/2013 FINDINGS: Lower chest: Cardiomegaly. Subsegmental atelectasis or scarring at the bases. Hepatobiliary: No focal liver abnormality.Cholelithiasis. No evidence of acute cholecystitis or biliary obstruction Pancreas: Unremarkable. Spleen: Unremarkable. Adrenals/Urinary Tract: Negative adrenals. No hydronephrosis or stone. 18 mm simple appearing left renal cyst. Unremarkable bladder. Stomach/Bowel: No obstruction. Moderate colonic gas and stool. No pericecal or other inflammation. Vascular/Lymphatic: Narrow appearance of the main portal vein that is chronic. 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 No mass or adenopathy. Reproductive:Hysterectomy and possible right oophorectomy. There is a left adnexal cyst measuring 4.1 cm. Enhancing tissue along the left aspect of the wall could be residual  ovary. This cyst had internal complex material in 2014 pelvic sonography. This mass was also evaluated at [facility] per sonographic report May 2011, when complexity was also noted at that time. Other: No ascites or pneumoperitoneum. Musculoskeletal: Spondylosis and facet  arthropathy. No acute or aggressive finding. IMPRESSION: 1. No acute finding. 2. Cholelithiasis without signs of cholecystitis. 3. 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 4. Chronic left adnexal cyst measuring up to 4.1 cm, size stable from 2009. Recommend yearly pelvic sonography. Electronically Signed   By: Marnee Spring M.D.   On: 06-Aug-2016 16:58   US Abdomen Limited Ruq  Result Date: 08-06-16 CLINICAL DATA:  Hyperbilirubinemia.  Known cholelithiasis. EXAM: ULTRASOUND ABDOMEN LIMITED RIGHT UPPER QUADRANT COMPARISON:  CT abdomen pelvis performed earlier today, 01/24/2013 and earlier. Right upper quadrant abdominal ultrasound 02/01/2013, 01/24/2013 and earlier. FINDINGS: Gallbladder: Multiple echogenic gallstones, the largest on the order of 10 mm. No gallbladder wall thickening or pericholecystic fluid. Negative sonographic Murphy's sign according to the ultrasound technologist. Common bile duct: Diameter: Approximately 5 mm proximally. No visible stones in the proximal duct. The mid and distal duct are obscured by duodenal bowel gas. Liver: Scattered geographic areas of increased parenchymal echotexture. No parenchymal masses. Patent though narrowed main portal vein with hepatopetal flow (as noted on the earlier CT). IMPRESSION: 1. Cholelithiasis without sonographic evidence of acute cholecystitis. 2. No evidence of biliary ductal dilation. 3. Scattered areas of focal hepatic steatosis. No hepatic masses. Narrowed main portal vein with hepatopetal flow (as noted on the earlier CT). Electronically Signed   By: Hulan Saas M.D.   On: 08/06/16 19:13     Assessment/Plan: 66 year old HIV positive  female with altered mental status.  Unclear baseline.  Patient with infection, hyponatremia, AKI and elevated LFT's.  Has recently used cocaine and may also used ETOH as well.  All or any of these may be contributing to her mental status.  On neurological examination today also reports left sided numbness.  Particularly in the setting of cocaine abuse can not rule out the possibility of an acute infarct.  Further work up recommended.    Recommendations: 1.  Agree with addressing medical issues 2.  MRI of the brain without contrast 3.  ASA 81mg  daily 4.  Serum ammonia, B12, B1   Thana Farr, MD Neurology [phone] 07/17/2016, 2:27 PM

## 2016-07-17 NOTE — Progress Notes (Signed)
Pharmacy Antibiotic Note  [name] is a 66 y.o. female admitted on 08/05/2016 with pneumonia/recent suspected pneumocystis PNA.    Patient was being treated for suspected pneumocystic PNA on recent admission and was discharged on 6/4 on SMX/TMP CD 2 tabs PO TID and a prednisone taper.  Pharmacy consulted to dose vancomycin, cefepime, and SMX/TMP. ID following.  Plan: Doses being adjusted due to improvement in renal function.  Increase cefepime to 2 g IV q8h  Continue SMX-TMP DS 2 tablets TID for a total of 21 days. Should stop on 6/19.  Change vancomycin dose to 750 mg IV q12h Goal VT 15-20 mcg/mL VT ordered for 6/10 @ 0830 which is prior to 5th dose on this regimen and represents steady state  Kinetics: Using adjusted body weight = 65 kg, CrCl = 74 mL/min Ke: 0.065 Half-life: 10.5 hrs Vd: 45 L Cmin (estimated) = 16 mcg/mL Pt is at risk for accumulation due to weight  Height: 5\' 1"  (154.9 cm) Weight: 200 lb 8 oz (90.9 kg) IBW/kg (Calculated) : 47.8  Temp (24hrs), Avg:98.2 F (36.8 C), Min:97.8 F (36.6 C), Max:98.9 F (37.2 C)   Recent Labs Lab 2016-09-18 1505 2016-09-18 2150 07/16/16 0153 07/16/16 0822 07/16/16 1457  WBC 9.1  --  7.5  --   --   CREATININE 1.07* 1.16* 1.10* 0.92 0.81    Estimated Creatinine Clearance: 72.9 mL/min (by C-G formula based on SCr of 0.81 mg/dL).    No Known Allergies  Antimicrobials this admission: SMX-TMP 6/6 >>  vanc 6/6 >>  Cefepime 6/6 >>  Dose adjustments this admission: 6/8 cefepime 2 g IV q12h to 2 g IV q8h 6/8 vancomycin 1000 mg q18h --> 750 mg q12h  Microbiology results: 6/6 BCx: No growth 2 days one set, staph species mec A 1/4  Thank you for allowing pharmacy to be a part of this patient's care.  Cindi CarbonMary M Nyana Haren, PharmD, BCPS Clinical Pharmacist 07/17/2016 8:19 AM

## 2016-07-17 NOTE — Progress Notes (Signed)
[employer] [facility] [place] [facility]                                                                                                                                                                                  Patient Demographics   [name], is a 66 y.o. female, DOB - 04/14/1950, WUJ:[id number]  Admit date - 08/06/2016   Admitting Physician Katha HammingSnehalatha Konidena, MD  Outpatient [facility] the [facility]   LOS - 2  Subjective: Patient confused and daughter at bedside, her daughter states that the reason patient's boyfriend took her home was sick at the checked that day so they likely use cocaine and had lot of alcohol to drink Her urine drug screen shows cocaine  Review of Systems:   CONSTITUTIONAL:Confused  Vitals:   Vitals:   07/17/16 0618 07/17/16 0738 07/17/16 0926 07/17/16 1256  BP:   109/66 (!) 120/56  Pulse:   95 95  Resp:    18  Temp:    98 F (36.7 C)  TempSrc:    Oral  SpO2:  95%  96%  Weight: 200 lb 8 oz (90.9 kg)     Height:        Wt Readings from Last 3 Encounters:  07/17/16 200 lb 8 oz (90.9 kg)  07/04/16 212 lb (96.2 kg)     Intake/Output Summary (Last 24 hours) at 07/17/16 1320 Last data filed at 07/17/16 0645  Gross per 24 hour  Intake             1071 ml  Output              100 ml  Net              971 ml    Physical Exam:   GENERAL: Confused  HEAD, EYES, EARS, NOSE AND THROAT: Atraumatic, normocephalic. Extraocular muscles are intact. Pupils equal and reactive to light. Sclerae anicteric. No conjunctival injection. No oro-pharyngeal erythema.  NECK: Supple. There is no jugular venous distention. No bruits, no lymphadenopathy, no thyromegaly.  HEART: Regular rate and rhythm,. No murmurs, no rubs, no clicks.  LUNGS: Rhonchus breath sounds bilaterally.  ABDOMEN: Soft, flat, nontender, nondistended. Has good bowel sounds. No hepatosplenomegaly appreciated.  EXTREMITIES: No evidence of  any cyanosis, clubbing, or peripheral edema.  +2 pedal and radial pulses bilaterally.  NEUROLOGIC: The patient is little drowsy SKIN: Moist and warm with no rashes appreciated.  Psych: Not anxious, depressed LN: No inguinal LN enlargement    Antibiotics   Anti-infectives    Start     Dose/Rate Route Frequency Ordered Stop   07/17/16 1400  ceFEPIme (  MAXIPIME) 2 g in dextrose 5 % 50 mL IVPB     2 g 100 mL/hr over 30 Minutes Intravenous Every 8 hours 07/17/16 0818     07/17/16 0900  vancomycin (VANCOCIN) IVPB 750 mg/150 ml premix     750 mg 150 mL/hr over 60 Minutes Intravenous Every 12 hours 07/17/16 0816     07/16/16 1200  fluconazole (DIFLUCAN) IVPB 100 mg     100 mg 50 mL/hr over 60 Minutes Intravenous Daily 07/16/16 1052     07/16/16 1000  bictegravir-emtricitabine-tenofovir AF (BIKTARVY) 50-200-25 MG per tablet 1 tablet     1 tablet Oral Daily 07/16/16 0428     07/16/16 0800  darunavir-cobicistat (PREZCOBIX) 800-150 MG per tablet 1 tablet    Comments:  Swallow whole. Do NOT crush, break or chew tablets. Take with food.     1 tablet Oral Daily with breakfast 08/01/2016 1953     07/16/16 0700  sulfamethoxazole-trimethoprim (BACTRIM DS,SEPTRA DS) 800-160 MG per tablet 2 tablet     2 tablet Oral 3 times daily 07/16/16 0147     07/16/16 0600  ceFEPIme (MAXIPIME) 2 g in dextrose 5 % 50 mL IVPB  Status:  Discontinued     2 g 100 mL/hr over 30 Minutes Intravenous Every 12 hours 08/07/2016 2016 07/17/16 0818   07/16/16 0000  sulfamethoxazole-trimethoprim (BACTRIM DS,SEPTRA DS) 800-160 MG per tablet 2 tablet  Status:  Discontinued     2 tablet Oral 3 times daily 07/23/2016 1953 07/16/16 0147   07/11/2016 2300  vancomycin (VANCOCIN) IVPB 1000 mg/200 mL premix  Status:  Discontinued     1,000 mg 200 mL/hr over 60 Minutes Intravenous Every 18 hours 07/28/2016 2016 07/17/16 0815   08/02/2016 2015  sulfamethoxazole-trimethoprim (BACTRIM,SEPTRA) 400-80 MG per tablet 1 tablet  Status:  Discontinued     1  tablet Oral  Once 07/10/2016 2005 07/24/2016 2009   07/20/2016 2015  sulfamethoxazole-trimethoprim (BACTRIM DS,SEPTRA DS) 800-160 MG per tablet 1 tablet     1 tablet Oral  Once 07/26/2016 2009 07/22/2016 2321   07/14/2016 2000  dolutegravir (TIVICAY) tablet 50 mg  Status:  Discontinued     50 mg Oral Daily 07/14/2016 1953 07/16/16 1447   08/07/2016 1645  ceFEPIme (MAXIPIME) 2 g in dextrose 5 % 50 mL IVPB     2 g 100 mL/hr over 30 Minutes Intravenous  Once 07/27/2016 1643 07/30/2016 2328   07/25/2016 1645  vancomycin (VANCOCIN) IVPB 1000 mg/200 mL premix     1,000 mg 200 mL/hr over 60 Minutes Intravenous  Once 07/27/2016 1643 07/26/2016 2328   07/18/2016 1645  sulfamethoxazole-trimethoprim (BACTRIM DS,SEPTRA DS) 800-160 MG per tablet 1 tablet     1 tablet Oral  Once 08/06/2016 1643 07/20/2016 1700      Medications   Scheduled Meds: . amitriptyline  25 mg Oral QHS  . bictegravir-emtricitabine-tenofovir AF  1 tablet Oral Daily  . darunavir-cobicistat  1 tablet Oral Q breakfast  . docusate  100 mg Oral BID  . furosemide  40 mg Oral Daily  . heparin  5,000 Units Subcutaneous Q8H  . ipratropium-albuterol  3 mL Nebulization TID  . [START ON 07/18/2016] predniSONE  40 mg Oral Q breakfast   Followed by  . [START ON 07/23/2016] predniSONE  20 mg Oral Q breakfast  . predniSONE  40 mg Oral BID WC  . QUEtiapine  25 mg Oral QHS  . ramipril  10 mg Oral QPM  . sodium chloride  1 g Oral BID WC  .  sulfamethoxazole-trimethoprim  2 tablet Oral TID   Continuous Infusions: . ceFEPime (MAXIPIME) IV    . fluconazole (DIFLUCAN) IV Stopped (07/17/16 1145)  . vancomycin Stopped (07/17/16 1146)   PRN Meds:.acetaminophen **OR** acetaminophen, bisacodyl, ondansetron **OR** ondansetron (ZOFRAN) IV, traZODone   Data Review:   Micro Results Recent Results (from the past 240 hour(s))  Respiratory Panel by PCR     Status: None   Collection Time: 07/07/16  4:57 PM  Result Value Ref Range Status   Adenovirus NOT DETECTED NOT DETECTED  Final   Coronavirus 229E NOT DETECTED NOT DETECTED Final   Coronavirus HKU1 NOT DETECTED NOT DETECTED Final   Coronavirus NL63 NOT DETECTED NOT DETECTED Final   Coronavirus OC43 NOT DETECTED NOT DETECTED Final   Metapneumovirus NOT DETECTED NOT DETECTED Final   Rhinovirus / Enterovirus NOT DETECTED NOT DETECTED Final   Influenza A NOT DETECTED NOT DETECTED Final   Influenza B NOT DETECTED NOT DETECTED Final   Parainfluenza Virus 1 NOT DETECTED NOT DETECTED Final   Parainfluenza Virus 2 NOT DETECTED NOT DETECTED Final   Parainfluenza Virus 3 NOT DETECTED NOT DETECTED Final   Parainfluenza Virus 4 NOT DETECTED NOT DETECTED Final   Respiratory Syncytial Virus NOT DETECTED NOT DETECTED Final   Bordetella pertussis NOT DETECTED NOT DETECTED Final   Chlamydophila pneumoniae NOT DETECTED NOT DETECTED Final   Mycoplasma pneumoniae NOT DETECTED NOT DETECTED Final    Comment: Performed at [facility], [address] [place], [place] [address]  Culture, expectorated sputum-assessment     Status: None   Collection Time: 07/08/16 12:48 PM  Result Value Ref Range Status   Specimen Description EXPECTORATED SPUTUM  Final   Special Requests Immunocompromised  Final   Sputum evaluation THIS SPECIMEN IS ACCEPTABLE FOR SPUTUM CULTURE  Final   Report Status 07/09/2016 FINAL  Final  Culture, respiratory (NON-Expectorated)     Status: None   Collection Time: 07/08/16 12:48 PM  Result Value Ref Range Status   Specimen Description EXPECTORATED SPUTUM  Final   Special Requests Immunocompromised Reflexed from [id number]  Final   Gram Stain   Final    MODERATE WBC PRESENT,BOTH PMN AND MONONUCLEAR RARE GRAM POSITIVE COCCI IN PAIRS RARE GRAM POSITIVE RODS    Culture   Final    Consistent with normal respiratory flora. Performed at [facility], [address], [place], [place] [address]    Report Status 07/11/2016 FINAL  Final  Blood culture (routine x 2)     Status: None (Preliminary result)    Collection Time: August 07, 2016  4:56 PM  Result Value Ref Range Status   Specimen Description BLOOD LAC  Final   Special Requests   Final    BOTTLES DRAWN AEROBIC AND ANAEROBIC Blood Culture results may not be optimal due to an inadequate volume of blood received in culture bottles   Culture NO GROWTH 2 DAYS  Final   Report Status PENDING  Incomplete  Blood culture (routine x 2)     Status: None (Preliminary result)   Collection Time: 08/07/2016  4:56 PM  Result Value Ref Range Status   Specimen Description BLOOD R HAND  Final   Special Requests   Final    BOTTLES DRAWN AEROBIC AND ANAEROBIC Blood Culture results may not be optimal due to an inadequate volume of blood received in culture bottles   Culture  Setup Time   Final    Organism ID to follow GRAM POSITIVE COCCI IN BOTH AEROBIC AND  ANAEROBIC BOTTLES CRITICAL RESULT CALLED TO, READ BACK BY AND VERIFIED WITH: NATE COOKSON AT 1558 ON 07/16/2016 JJB    Culture GRAM POSITIVE COCCI  Final   Report Status PENDING  Incomplete  Blood Culture ID Panel (Reflexed)     Status: Abnormal   Collection Time: 07/26/2016  4:56 PM  Result Value Ref Range Status   Enterococcus species NOT DETECTED NOT DETECTED Final   Listeria monocytogenes NOT DETECTED NOT DETECTED Final   Staphylococcus species DETECTED (A) NOT DETECTED Final    Comment: Methicillin (oxacillin) resistant coagulase negative staphylococcus. Possible blood culture contaminant (unless isolated from more than one blood culture draw or clinical case suggests pathogenicity). No antibiotic treatment is indicated for blood  culture contaminants. CRITICAL RESULT CALLED TO, READ BACK BY AND VERIFIED WITH: NATE COOKSON AT 1558 ON 07/16/2016 JJB    Staphylococcus aureus NOT DETECTED NOT DETECTED Final   Methicillin resistance DETECTED (A) NOT DETECTED Final    Comment: CRITICAL RESULT CALLED TO, READ BACK BY AND VERIFIED WITH: NATE COOKSON AT 1558 ON 07/16/2016 JJB    Streptococcus species NOT  DETECTED NOT DETECTED Final   Streptococcus agalactiae NOT DETECTED NOT DETECTED Final   Streptococcus pneumoniae NOT DETECTED NOT DETECTED Final   Streptococcus pyogenes NOT DETECTED NOT DETECTED Final   Acinetobacter baumannii NOT DETECTED NOT DETECTED Final   Enterobacteriaceae species NOT DETECTED NOT DETECTED Final   Enterobacter cloacae complex NOT DETECTED NOT DETECTED Final   Escherichia coli NOT DETECTED NOT DETECTED Final   Klebsiella oxytoca NOT DETECTED NOT DETECTED Final   Klebsiella pneumoniae NOT DETECTED NOT DETECTED Final   Proteus species NOT DETECTED NOT DETECTED Final   Serratia marcescens NOT DETECTED NOT DETECTED Final   Haemophilus influenzae NOT DETECTED NOT DETECTED Final   Neisseria meningitidis NOT DETECTED NOT DETECTED Final   Pseudomonas aeruginosa NOT DETECTED NOT DETECTED Final   Candida albicans NOT DETECTED NOT DETECTED Final   Candida glabrata NOT DETECTED NOT DETECTED Final   Candida krusei NOT DETECTED NOT DETECTED Final   Candida parapsilosis NOT DETECTED NOT DETECTED Final   Candida tropicalis NOT DETECTED NOT DETECTED Final    Radiology Reports Dg Chest 2 View  Result Date: 07/16/2016 CLINICAL DATA:  Recently hospitalized with pneumonia. Patient returns with shortness of breath. History of hypertension and HIV. EXAM: CHEST  2 VIEW COMPARISON:  Chest x-ray of Jul 09, 2016 FINDINGS: The lungs are reasonably well inflated. There is a markings are increased. There is are areas of atelectasis or linear infiltrate at the lung bases similar to that seen on May 31st. There is no large pleural effusion. The cardiac silhouette is mildly enlarged. There is calcification in the wall of the thoracic aorta. There is multilevel degenerative disc disease of the thoracic spine. IMPRESSION: Bilateral interstitial pneumonia greatest at the lung bases. No CHF. The appearance of the chest has deteriorated since the PA and lateral chest x-ray of Jul 04, 2016 and is slightly  worse than on the the Jul 09, 2016 study. Electronically Signed   By: David  [place] M.D.   On: 07/25/2016 16:07   Dg Chest 2 View  Result Date: 07/09/2016 CLINICAL DATA:  Pneumonia. EXAM: CHEST  2 VIEW COMPARISON:  Radiographs of Jul 04, 2016. FINDINGS: Stable cardiomediastinal silhouette. No pneumothorax is noted. Mildly increased bibasilar opacities are noted concerning for atelectasis or infiltrates, right greater than left. Minimal right pleural effusion may be present. Bony thorax is unremarkable. IMPRESSION: Mildly increased bibasilar atelectasis or infiltrates are noted,  right greater than left. Electronically Signed   By: Lupita Raider, M.D.   On: 07/09/2016 08:43   Dg Chest 2 View  Result Date: 07/04/2016 CLINICAL DATA:  Cough and congestion for several days EXAM: CHEST  2 VIEW COMPARISON:  02/03/2013 FINDINGS: Cardiac shadow is mildly enlarged. Mild platelike atelectasis is noted in the bases bilaterally. Mild vascular congestion with mild interstitial edema is seen. No sizable effusion or infiltrate is noted. Degenerative changes of the thoracic spine are seen. IMPRESSION: Mild left basilar atelectasis. Mild vascular congestion interstitial edema. Electronically Signed   By: Alcide Clever M.D.   On: 07/04/2016 14:08   Dg Ribs Unilateral Right  Result Date: 07/07/2016 CLINICAL DATA:  Right lower anterior rib pain when coughing. EXAM: RIGHT RIBS - 2 VIEW COMPARISON:  Chest CT from 3 days prior FINDINGS: No fracture or other bone lesions are seen involving the ribs. Streaky opacity at the right base consistent with atelectasis. Layering gallstones. IMPRESSION: 1. Negative right rib series. 2. Atelectasis at the right base. 3. Cholelithiasis. Electronically Signed   By: Marnee Spring M.D.   On: 07/07/2016 10:26   Ct Angio Chest Pe W Or Wo Contrast  Result Date: 07/04/2016 CLINICAL DATA:  Cough and congestion for several days EXAM: CT ANGIOGRAPHY CHEST WITH CONTRAST TECHNIQUE: Multidetector  CT imaging of the chest was performed using the standard protocol during bolus administration of intravenous contrast. Multiplanar CT image reconstructions and MIPs were obtained to evaluate the vascular anatomy. CONTRAST:  75 mL Isovue 370. COMPARISON:  Plain film from earlier in the same day. FINDINGS: Cardiovascular: Atherosclerotic calcifications of the aorta are noted without aneurysmal dilatation or dissection. Pulmonary artery demonstrates a normal branching pattern. No filling defects to suggest pulmonary emboli are identified. No significant cardiac enlargement is seen. No coronary calcifications are noted. Mediastinum/Nodes: No significant hilar or mediastinal adenopathy is noted. The thoracic inlet is within normal limits. Lungs/Pleura: Lungs are well aerated bilaterally. No focal infiltrate or sizable effusion is seen. Platelike atelectasis is noted in the left lower lobe similar to that seen on recent plain film examination. No sizable effusion or pneumothorax is noted. Upper Abdomen: Mild fatty infiltration of the liver is seen. The remainder the upper abdomen is within normal limits. Musculoskeletal: Degenerative changes of the thoracic spine are noted. No acute bony abnormality is seen. Review of the MIP images confirms the above findings. IMPRESSION: No evidence of pulmonary emboli. Mild left lower lobe atelectasis similar to that seen on prior plain film examination. No focal confluent infiltrate is seen. Electronically Signed   By: Alcide Clever M.D.   On: 07/04/2016 15:47   Ct Abdomen Pelvis W Contrast  Result Date: 08/09/2016 CLINICAL DATA:  Acute right lower quadrant pain. EXAM: CT ABDOMEN AND PELVIS WITH CONTRAST TECHNIQUE: Multidetector CT imaging of the abdomen and pelvis was performed using the standard protocol following bolus administration of intravenous contrast. CONTRAST:  ISOVUE-300 IOPAMIDOL (ISOVUE-300) INJECTION 61% COMPARISON:  01/24/2013 FINDINGS: Lower chest:  Cardiomegaly. Subsegmental atelectasis or scarring at the bases. Hepatobiliary: No focal liver abnormality.Cholelithiasis. No evidence of acute cholecystitis or biliary obstruction Pancreas: Unremarkable. Spleen: Unremarkable. Adrenals/Urinary Tract: Negative adrenals. No hydronephrosis or stone. 18 mm simple appearing left renal cyst. Unremarkable bladder. Stomach/Bowel: No obstruction. Moderate colonic gas and stool. No pericecal or other inflammation. Vascular/Lymphatic: Narrow appearance of the main portal vein that is chronic. 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 No mass or adenopathy. Reproductive:Hysterectomy and possible right oophorectomy. There  is a left adnexal cyst measuring 4.1 cm. Enhancing tissue along the left aspect of the wall could be residual ovary. This cyst had internal complex material in 2014 pelvic sonography. This mass was also evaluated at [facility] per sonographic report May 2011, when complexity was also noted at that time. Other: No ascites or pneumoperitoneum. Musculoskeletal: Spondylosis and facet arthropathy. No acute or aggressive finding. IMPRESSION: 1. No acute finding. 2. Cholelithiasis without signs of cholecystitis. 3. 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 4. Chronic left adnexal cyst measuring up to 4.1 cm, size stable from 2009. Recommend yearly pelvic sonography. Electronically Signed   By: Marnee Spring M.D.   On: 07/27/2016 16:58   US Abdomen Limited Ruq  Result Date: 08/03/2016 CLINICAL DATA:  Hyperbilirubinemia.  Known cholelithiasis. EXAM: ULTRASOUND ABDOMEN LIMITED RIGHT UPPER QUADRANT COMPARISON:  CT abdomen pelvis performed earlier today, 01/24/2013 and earlier. Right upper quadrant abdominal ultrasound 02/01/2013, 01/24/2013 and earlier. FINDINGS: Gallbladder: Multiple echogenic gallstones, the largest on the order of 10 mm. No gallbladder wall thickening or pericholecystic fluid.  Negative sonographic Murphy's sign according to the ultrasound technologist. Common bile duct: Diameter: Approximately 5 mm proximally. No visible stones in the proximal duct. The mid and distal duct are obscured by duodenal bowel gas. Liver: Scattered geographic areas of increased parenchymal echotexture. No parenchymal masses. Patent though narrowed main portal vein with hepatopetal flow (as noted on the earlier CT). IMPRESSION: 1. Cholelithiasis without sonographic evidence of acute cholecystitis. 2. No evidence of biliary ductal dilation. 3. Scattered areas of focal hepatic steatosis. No hepatic masses. Narrowed main portal vein with hepatopetal flow (as noted on the earlier CT). Electronically Signed   By: Hulan Saas M.D.   On: 08/03/2016 19:13     CBC  Recent Labs Lab 08/03/2016 1505 07/16/16 0153  WBC 9.1 7.5  HGB 15.1 14.9  HCT 43.8 43.4  PLT 218 193  MCV 91.2 91.3  MCH 31.4 31.2  MCHC 34.4 34.2  RDW 14.9* 14.9*  LYMPHSABS 1.4  --   MONOABS 1.5*  --   EOSABS 0.0  --   BASOSABS 0.0  --     Chemistries   Recent Labs Lab 19-Jul-2016 1505 19-Jul-2016 2150 07/16/16 0153 07/16/16 0822 07/16/16 1457 07/16/16 2018 07/17/16 0144 07/17/16 0817  NA 122* 123* 121* 122* 123*  124* 122* 124* 125*  K 3.5 3.0* 3.3* 3.4* 3.8  --   --   --   CL 87* 86* 86* 91* 88*  --   --   --   CO2 24 27 25 22 25   --   --   --   GLUCOSE 136* 132* 144* 131* 209*  --   --   --   BUN 24* 24* 23* 22* 19  --   --   --   CREATININE 1.07* 1.16* 1.10* 0.92 0.81  --   --   --   CALCIUM 8.8* 8.4* 8.4* 8.1* 8.5*  --   --   --   MG  --   --   --  2.2  --   --   --   --   AST 96*  --   --   --   --   --   --   --   ALT 100*  --   --   --   --   --   --   --   ALKPHOS 70  --   --   --   --   --   --   --  BILITOT 4.0*  --   --   --   --   --   --   --    ------------------------------------------------------------------------------------------------------------------ estimated creatinine clearance is 72.9  mL/min (by C-G formula based on SCr of 0.81 mg/dL). ------------------------------------------------------------------------------------------------------------------ No results for input(s): HGBA1C in the last 72 hours. ------------------------------------------------------------------------------------------------------------------ No results for input(s): CHOL, HDL, LDLCALC, TRIG, CHOLHDL, LDLDIRECT in the last 72 hours. ------------------------------------------------------------------------------------------------------------------  Recent Labs  07/16/16 1457  TSH 1.861   ------------------------------------------------------------------------------------------------------------------ No results for input(s): VITAMINB12, FOLATE, FERRITIN, TIBC, IRON, RETICCTPCT in the last 72 hours.  Coagulation profile No results for input(s): INR, PROTIME in the last 168 hours.  No results for input(s): DDIMER in the last 72 hours.  Cardiac Enzymes  Recent Labs Lab Aug 01, 2016 1505  TROPONINI <0.03   ------------------------------------------------------------------------------------------------------------------ Invalid input(s): POCBNP    Assessment & Plan   1. Acute respiratory failure with hypoxia. Chest x-ray worse, Echocardiogram shows some diastolic dysfunction otherwise no significant abnormality, continue antibiotics as per infectious disease 2. Hyponatremia. 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 Acute delirium could be related to withdrawal I will ask neurology to see 4. HIV. 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 Appreciate ID consultation and continue haart treatment. 5. Abnormal liver function tests continue to monitor 6. Essential hypertension on hydrochlorothiazide and ramipril 7. Hypokalemia continued to report 8. Depression on amitriptyline 9.   Rib pain- could be costochondritis. 10. Weakness. Strongly recommend rehabilitation as per  previous admission 11. Type 2 diabetes mellitus. Hemoglobin A1c 7.6. On sliding scale for now.  12. Oropharyngeal candidiasis continue therapy with fluconazole     Code Status Orders        Start     Ordered   Aug 01, 2016 1952  Full code  Continuous     08-01-16 1953    Code Status History    Date Active Date Inactive Code Status Order ID Comments User Context   07/04/2016  6:31 PM 07/13/2016  7:05 PM Full Code [id number]  Houston Siren, MD Inpatient           Consults  ID   DVT Prophylaxis  Lovenox   Lab Results  Component Value Date   PLT 193 07/16/2016     Time Spent in minutes    Greater than 50% of time spent in care coordination and counseling patient regarding the condition and plan of care.   Auburn Bilberry M.D on 07/17/2016 at 1:20 PM  Between 7am to 6pm - Pager - [phone]  After 6pm go to [email or web address] - password [email or web address] [facility]  [facility] [place] [facility]   Office  [phone]

## 2016-07-17 NOTE — Progress Notes (Addendum)
[facility] INFECTIOUS DISEASE PROGRESS NOTE Date of Admission:  08-11-2016     ID: [name] is a 66 y.o. female with HIV Active Problems:   Acute respiratory failure (HCC)   Subjective: No fevers, not on O2. NA improving. Noted to have thrush. Still confused  ROS  Unable to obtain   Medications:  Antibiotics Given (last 72 hours)    Date/Time Action Medication Dose Rate   08-11-2016 1700 New Bag/Given   vancomycin (VANCOCIN) IVPB 1000 mg/200 mL premix 1,000 mg 200 mL/hr   Aug 11, 2016 1700 Given   sulfamethoxazole-trimethoprim (BACTRIM DS,SEPTRA DS) 800-160 MG per tablet 1 tablet 1 tablet    08/11/16 1843 New Bag/Given   ceFEPIme (MAXIPIME) 2 g in dextrose 5 % 50 mL IVPB 2 g 100 mL/hr   2016-08-11 2321 Given   sulfamethoxazole-trimethoprim (BACTRIM DS,SEPTRA DS) 800-160 MG per tablet 1 tablet 1 tablet    August 11, 2016 2321 New Bag/Given   vancomycin (VANCOCIN) IVPB 1000 mg/200 mL premix 1,000 mg 200 mL/hr   07/16/16 0641 New Bag/Given   ceFEPIme (MAXIPIME) 2 g in dextrose 5 % 50 mL IVPB 2 g 100 mL/hr   07/16/16 0939 Given   sulfamethoxazole-trimethoprim (BACTRIM DS,SEPTRA DS) 800-160 MG per tablet 2 tablet 2 tablet    07/16/16 0941 Given   darunavir-cobicistat (PREZCOBIX) 800-150 MG per tablet 1 tablet 1 tablet    07/16/16 0942 Given   bictegravir-emtricitabine-tenofovir AF (BIKTARVY) 50-200-25 MG per tablet 1 tablet 1 tablet    07/16/16 0943 Given   dolutegravir (TIVICAY) tablet 50 mg 50 mg    07/16/16 1456 Given   sulfamethoxazole-trimethoprim (BACTRIM DS,SEPTRA DS) 800-160 MG per tablet 2 tablet 2 tablet    07/16/16 1739 New Bag/Given   ceFEPIme (MAXIPIME) 2 g in dextrose 5 % 50 mL IVPB 2 g 100 mL/hr   07/16/16 1810 New Bag/Given   vancomycin (VANCOCIN) IVPB 1000 mg/200 mL premix 1,000 mg 200 mL/hr   07/16/16 2102 Given   sulfamethoxazole-trimethoprim (BACTRIM DS,SEPTRA DS) 800-160 MG per tablet 2 tablet 2 tablet    07/17/16 0616 New Bag/Given   ceFEPIme (MAXIPIME) 2 g in  dextrose 5 % 50 mL IVPB 2 g 100 mL/hr   07/17/16 1046 New Bag/Given   vancomycin (VANCOCIN) IVPB 750 mg/150 ml premix 750 mg 150 mL/hr     . bictegravir-emtricitabine-tenofovir AF  1 tablet Oral Daily  . darunavir-cobicistat  1 tablet Oral Q breakfast  . docusate  100 mg Oral BID  . furosemide  40 mg Oral Daily  . heparin  5,000 Units Subcutaneous Q8H  . ipratropium-albuterol  3 mL Nebulization TID  . [START ON 07/18/2016] predniSONE  40 mg Oral Q breakfast   Followed by  . [START ON 07/23/2016] predniSONE  20 mg Oral Q breakfast  . predniSONE  40 mg Oral BID WC  . ramipril  10 mg Oral QPM  . sodium chloride  1 g Oral BID WC  . sulfamethoxazole-trimethoprim  2 tablet Oral TID    Objective: Vital signs in last 24 hours: Temp:  [98 F (36.7 C)-98.9 F (37.2 C)] 98 F (36.7 C) (06/08 1256) Pulse Rate:  [95-96] 95 (06/08 1256) Resp:  [18-22] 18 (06/08 1256) BP: (105-123)/(56-92) 120/56 (06/08 1256) SpO2:  [93 %-98 %] 96 % (06/08 1256) Weight:  [90.9 kg (200 lb 8 oz)] 90.9 kg (200 lb 8 oz) (06/08 0618) Constitutional: obese, confused, but able to follow commands. Obese,Not  on O2, no  resp distress HENT: [place]/AT, PERRLA, no scleral icterus Mouth/Throat:  Oropharynx is clear and moist. No oropharyngeal exudate.  Cardiovascular: Normal rate, regular rhythm and normal heart sounds. Pulmonary/Chest: mild rhonchi, good air movmeent, no wheeze,  Neck = supple, no nuchal rigidity Abdominal: Soft. Bowel sounds are normal.  exhibits no distension. There is no tenderness.  Lymphadenopathy: no cervical adenopathy. No axillary adenopathy Neurological onfused, but able to follow commands, moves all 4 Skin: Skin is warm and dry. No rash noted. No erythema.  Psychiatric: confused  Lab Results  Recent Labs  02/03/17 1505  07/16/16 0153 07/16/16 0822 07/16/16 1457  07/17/16 0144 07/17/16 0817  WBC 9.1  --  7.5  --   --   --   --   --   HGB 15.1  --  14.9  --   --   --   --   --   HCT 43.8   --  43.4  --   --   --   --   --   NA 122*  < > 121* 122* 123*  124*  < > 124* 125*  K 3.5  < > 3.3* 3.4* 3.8  --   --   --   CL 87*  < > 86* 91* 88*  --   --   --   CO2 24  < > 25 22 25   --   --   --   BUN 24*  < > 23* 22* 19  --   --   --   CREATININE 1.07*  < > 1.10* 0.92 0.81  --   --   --   < > = values in this interval not displayed.  Microbiology: Results for orders placed or performed during the hospital encounter of 02/03/17  Blood culture (routine x 2)     Status: None (Preliminary result)   Collection Time: 02/03/17  4:56 PM  Result Value Ref Range Status   Specimen Description BLOOD LAC  Final   Special Requests   Final    BOTTLES DRAWN AEROBIC AND ANAEROBIC Blood Culture results may not be optimal due to an inadequate volume of blood received in culture bottles   Culture NO GROWTH 2 DAYS  Final   Report Status PENDING  Incomplete  Blood culture (routine x 2)     Status: None (Preliminary result)   Collection Time: 02/03/17  4:56 PM  Result Value Ref Range Status   Specimen Description BLOOD R HAND  Final   Special Requests   Final    BOTTLES DRAWN AEROBIC AND ANAEROBIC Blood Culture results may not be optimal due to an inadequate volume of blood received in culture bottles   Culture  Setup Time   Final    Organism ID to follow GRAM POSITIVE COCCI IN BOTH AEROBIC AND ANAEROBIC BOTTLES CRITICAL RESULT CALLED TO, READ BACK BY AND VERIFIED WITH: NATE COOKSON AT 1558 ON 07/16/2016 JJB    Culture GRAM POSITIVE COCCI  Final   Report Status PENDING  Incomplete  Blood Culture ID Panel (Reflexed)     Status: Abnormal   Collection Time: 02/03/17  4:56 PM  Result Value Ref Range Status   Enterococcus species NOT DETECTED NOT DETECTED Final   Listeria monocytogenes NOT DETECTED NOT DETECTED Final   Staphylococcus species DETECTED (A) NOT DETECTED Final    Comment: Methicillin (oxacillin) resistant coagulase negative staphylococcus. Possible blood culture contaminant (unless  isolated from more than one blood culture draw or clinical case suggests pathogenicity). No antibiotic treatment is indicated for blood  culture contaminants. CRITICAL RESULT CALLED TO, READ BACK BY AND VERIFIED WITH: NATE COOKSON AT 1558 ON 07/16/2016 JJB    Staphylococcus aureus NOT DETECTED NOT DETECTED Final   Methicillin resistance DETECTED (A) NOT DETECTED Final    Comment: CRITICAL RESULT CALLED TO, READ BACK BY AND VERIFIED WITH: NATE COOKSON AT 1558 ON 07/16/2016 JJB    Streptococcus species NOT DETECTED NOT DETECTED Final   Streptococcus agalactiae NOT DETECTED NOT DETECTED Final   Streptococcus pneumoniae NOT DETECTED NOT DETECTED Final   Streptococcus pyogenes NOT DETECTED NOT DETECTED Final   Acinetobacter baumannii NOT DETECTED NOT DETECTED Final   Enterobacteriaceae species NOT DETECTED NOT DETECTED Final   Enterobacter cloacae complex NOT DETECTED NOT DETECTED Final   Escherichia coli NOT DETECTED NOT DETECTED Final   Klebsiella oxytoca NOT DETECTED NOT DETECTED Final   Klebsiella pneumoniae NOT DETECTED NOT DETECTED Final   Proteus species NOT DETECTED NOT DETECTED Final   Serratia marcescens NOT DETECTED NOT DETECTED Final   Haemophilus influenzae NOT DETECTED NOT DETECTED Final   Neisseria meningitidis NOT DETECTED NOT DETECTED Final   Pseudomonas aeruginosa NOT DETECTED NOT DETECTED Final   Candida albicans NOT DETECTED NOT DETECTED Final   Candida glabrata NOT DETECTED NOT DETECTED Final   Candida krusei NOT DETECTED NOT DETECTED Final   Candida parapsilosis NOT DETECTED NOT DETECTED Final   Candida tropicalis NOT DETECTED NOT DETECTED Final    Studies/Results: Dg Chest 2 View  Result Date: 07-29-2016 CLINICAL DATA:  Recently hospitalized with pneumonia. Patient returns with shortness of breath. History of hypertension and HIV. EXAM: CHEST  2 VIEW COMPARISON:  Chest x-ray of Jul 09, 2016 FINDINGS: The lungs are reasonably well inflated. There is a markings are  increased. There is are areas of atelectasis or linear infiltrate at the lung bases similar to that seen on May 31st. There is no large pleural effusion. The cardiac silhouette is mildly enlarged. There is calcification in the wall of the thoracic aorta. There is multilevel degenerative disc disease of the thoracic spine. IMPRESSION: Bilateral interstitial pneumonia greatest at the lung bases. No CHF. The appearance of the chest has deteriorated since the PA and lateral chest x-ray of Jul 04, 2016 and is slightly worse than on the the Jul 09, 2016 study. Electronically Signed   By: David  [place] M.D.   On: 07/29/16 16:07   Ct Abdomen Pelvis W Contrast  Result Date: 29-Jul-2016 CLINICAL DATA:  Acute right lower quadrant pain. EXAM: CT ABDOMEN AND PELVIS WITH CONTRAST TECHNIQUE: Multidetector CT imaging of the abdomen and pelvis was performed using the standard protocol following bolus administration of intravenous contrast. CONTRAST:  ISOVUE-300 IOPAMIDOL (ISOVUE-300) INJECTION 61% COMPARISON:  01/24/2013 FINDINGS: Lower chest: Cardiomegaly. Subsegmental atelectasis or scarring at the bases. Hepatobiliary: No focal liver abnormality.Cholelithiasis. No evidence of acute cholecystitis or biliary obstruction Pancreas: Unremarkable. Spleen: Unremarkable. Adrenals/Urinary Tract: Negative adrenals. No hydronephrosis or stone. 18 mm simple appearing left renal cyst. Unremarkable bladder. Stomach/Bowel: No obstruction. Moderate colonic gas and stool. No pericecal or other inflammation. Vascular/Lymphatic: Narrow appearance of the main portal vein that is chronic. 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 No mass or adenopathy. Reproductive:Hysterectomy and possible right oophorectomy. There is a left adnexal cyst measuring 4.1 cm. Enhancing tissue along the left aspect of the wall could be residual ovary. This cyst had internal complex material in 2014 pelvic sonography. This mass  was also evaluated at [facility] per sonographic report May 2011, when complexity was  also noted at that time. Other: No ascites or pneumoperitoneum. Musculoskeletal: Spondylosis and facet arthropathy. No acute or aggressive finding. IMPRESSION: 1. No acute finding. 2. Cholelithiasis without signs of cholecystitis. 3. 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 4. Chronic left adnexal cyst measuring up to 4.1 cm, size stable from 2009. Recommend yearly pelvic sonography. Electronically Signed   By: Marnee Spring M.D.   On: 07/25/2016 16:58   US Abdomen Limited Ruq  Result Date: 07/28/2016 CLINICAL DATA:  Hyperbilirubinemia.  Known cholelithiasis. EXAM: ULTRASOUND ABDOMEN LIMITED RIGHT UPPER QUADRANT COMPARISON:  CT abdomen pelvis performed earlier today, 01/24/2013 and earlier. Right upper quadrant abdominal ultrasound 02/01/2013, 01/24/2013 and earlier. FINDINGS: Gallbladder: Multiple echogenic gallstones, the largest on the order of 10 mm. No gallbladder wall thickening or pericholecystic fluid. Negative sonographic Murphy's sign according to the ultrasound technologist. Common bile duct: Diameter: Approximately 5 mm proximally. No visible stones in the proximal duct. The mid and distal duct are obscured by duodenal bowel gas. Liver: Scattered geographic areas of increased parenchymal echotexture. No parenchymal masses. Patent though narrowed main portal vein with hepatopetal flow (as noted on the earlier CT). IMPRESSION: 1. Cholelithiasis without sonographic evidence of acute cholecystitis. 2. No evidence of biliary ductal dilation. 3. Scattered areas of focal hepatic steatosis. No hepatic masses. Narrowed main portal vein with hepatopetal flow (as noted on the earlier CT). Electronically Signed   By: Hulan Saas M.D.   On: 07/25/2016 19:13    Assessment/Plan: Reisha Wos is a 66 y.o. female  with poorly controlled HIV, prior otpt CD4 203,  but CD4 on 5/26 down to  87/12%, viral load down to 200.  She was  restarted within last few weeks on ART with prezcobix and darunavir. She was  recently admitted with cough, sob, hypoxia and treated with bactrim and steroids, now readmitted with SOB, cough, abd pain. Found to have low sodium as well. Utox + cocaine and she has hx ETOH abuse and has had some beers since DC. No fevers and not hypoxic.  CT abd neg.  Prior admit sputum cx neg.  I suspect her admission is more related to polysubstance abuse, but will need to be monitored for IRIS or for recurrent infection. 1/2 bcx + CNS likely contaminant  Recommendations Dc cefepime, vanco Cont fluc for thrush. I would continue her course of treatment for PCP with 21 day course.  Continue ART with biktarvy and prezcobix.  Agree with MRI brain  since a little more confused than baseline  Thank you very much for the consult. Will follow with you.  FITZGERALD, DAVID P   07/17/2016, 2:28 PM

## 2016-07-17 NOTE — Plan of Care (Signed)
Problem: Education: Goal: Knowledge of [facility] Education information/materials will improve Outcome: Progressing VSS, free of falls during shift.  Satting 94% on RA.  Denies pain.  Confused, continually pulls at gown, sheets, PIV.  PIV wrapped to prevent removal by pt.  No needs overnight.  Bed in low position, call bell within reach.  WCTM.

## 2016-07-17 NOTE — Evaluation (Signed)
Clinical/Bedside Swallow Evaluation Patient Details  Name: Danton ClapJuanita Strickler MRN: [id number] Date of Birth: 1950-05-24  Today's Date: 07/17/2016 Time: SLP Start Time (ACUTE ONLY): 0935 SLP Stop Time (ACUTE ONLY): 1035 SLP Time Calculation (min) (ACUTE ONLY): 60 min  Past Medical History:  Past Medical History:  Diagnosis Date  . HIV (human immunodeficiency virus infection) (HCC)   . Hypertension    Past Surgical History: History reviewed. No pertinent surgical history. HPI:  Pt is a 66 y.o. female with a known history of HIV on antiretrovirals, who was discharged from hospital 2 days ago for treating her with pneumonia and possible PCP discharged with Bactrim, steroids comes back today with worsening shortness of breath, cough, abdominal pain. Patient was discharged on fourth of June with Bactrim, prednisone, oral antibiotics. Plan is to continue steroids for total of 21 days and also 21 days Bactrim. Patient now requiring 2 L of oxygen to keep saturation 96% and she also looks confused. The M found to be 121, chest x-ray showed worsening pneumonia. NSG reported poor toleration of po meds this morning (crushed in puree). NSG also stated pt has Thrush and is receiving IV diflucan.   Assessment / Plan / Recommendation Clinical Impression  Pt appears to present w/ increased risk for aspiration secondary to reduced alertness and orientation to task of taking po's. In addition, pt exhibited immediate throat clearing and hacking/spitting upon attempting to swallow the bolus trial(this appeared to be similar behavior NSG described when pt was given a TSP of applesauce w/ meds). Pt's oral phase appeared disorganized but she attempted to manipulate the bolus for transfer for swallowing. Unsure if pt's response when swallowing was d/t her Cognitive status(decline) or to the irritation of Thrush or both. As pt is not safe for oral intake at this time, recommend NPO status w/ frequent oral care while NPO. ST  services will f/u tomorrow for ongoing assessment of swallowing to establish least restrictive diet consistency. NSG/MD updated and agreed.  SLP Visit Diagnosis: Dysphagia, oropharyngeal phase (R13.12)    Aspiration Risk  Moderate aspiration risk;Severe aspiration risk;Risk for inadequate nutrition/hydration    Diet Recommendation  NPO w/ frequent oral care for hygiene and oral stimulation; general aspiration precautions during oral care  Medication Administration: Via alternative means    Other  Recommendations Recommended Consults:  (Dietician f/u) Oral Care Recommendations: Oral care QID;Staff/trained caregiver to provide oral care (general aspiration precautions) Other Recommendations:  (TBD)   Follow up Recommendations  (TBD)      Frequency and Duration min 3x week  2 weeks       Prognosis Prognosis for Safe Diet Advancement: Guarded Barriers to Reach Goals: Cognitive deficits;Severity of deficits      Swallow Study   General Date of Onset: 07/29/2016 HPI: Pt is a 66 y.o. female with a known history of HIV on antiretrovirals, who was discharged from hospital 2 days ago for treating her with pneumonia and possible PCP discharged with Bactrim, steroids comes back today with worsening shortness of breath, cough, abdominal pain. Patient was discharged on fourth of June with Bactrim, prednisone, oral antibiotics. Plan is to continue steroids for total of 21 days and also 21 days Bactrim. Patient now requiring 2 L of oxygen to keep saturation 96% and she also looks confused. The M found to be 121, chest x-ray showed worsening pneumonia. NSG reported poor toleration of po meds this morning (crushed in puree). NSG also stated pt has Thrush and is receiving IV diflucan. Type of Study: Bedside  Swallow Evaluation Previous Swallow Assessment: none indicated Diet Prior to this Study:  (unknown) Temperature Spikes Noted: No (9.1 wbc) Respiratory Status: Nasal cannula (2-4 liters) History of  Recent Intubation: No Behavior/Cognition: Lethargic/Drowsy;Confused;Distractible;Requires cueing;Doesn't follow directions (mumbled speech; eyes closed often w/out cues) Oral Cavity Assessment:  (difficult to assess) Oral Care Completed by SLP: Recent completion by staff Oral Cavity - Dentition: Missing dentition Vision:  (n/a) Self-Feeding Abilities: Total assist Patient Positioning: Upright in bed;Postural control adequate for testing Baseline Vocal Quality: Low vocal intensity (mumbled, incoherent speech intermittently) Volitional Cough: Cognitively unable to elicit Volitional Swallow: Unable to elicit    Oral/Motor/Sensory Function Overall Oral Motor/Sensory Function:  (minimal movements overall d/t Cognitive status)   Ice Chips Ice chips: Impaired (shaved ice bolus, flavored ) Presentation: Spoon (x1) Oral Phase Impairments: Poor awareness of bolus Oral Phase Functional Implications:  (unorganized attention to bolus) Pharyngeal Phase Impairments: Throat Clearing - Immediate;Multiple swallows (hacking and spitting behavior) Other Comments: pt was not alert and oriented to task of taking po's   Thin Liquid Thin Liquid: Not tested    Nectar Thick Nectar Thick Liquid: Not tested   Honey Thick Honey Thick Liquid: Not tested   Puree Puree: Not tested   Solid   GO   Solid: Not tested         Jerilynn Som, MS, CCC-SLP Watson,Katherine 07/17/2016,2:50 PM

## 2016-07-17 NOTE — Progress Notes (Signed)
[facility]  ROUNDING NOTE   Subjective:  Patient quite lethargic today. She is arousable. Her speech appears mumbled. Serum sodium has come up to 125.   Objective:  Vital signs in last 24 hours:  Temp:  [98 F (36.7 C)-98.9 F (37.2 C)] 98.1 F (36.7 C) (06/08 0339) Pulse Rate:  [89-96] 95 (06/08 0926) Resp:  [18-22] 22 (06/08 0339) BP: (105-138)/(66-92) 109/66 (06/08 0926) SpO2:  [93 %-98 %] 95 % (06/08 0738) Weight:  [90.9 kg (200 lb 8 oz)] 90.9 kg (200 lb 8 oz) (06/08 0618)  Weight change: -5.216 kg (-11 lb 8 oz) Filed Weights   07/18/2016 1506 18-Jul-2016 2126 07/17/16 0618  Weight: 96.2 kg (212 lb) 92.3 kg (203 lb 6.4 oz) 90.9 kg (200 lb 8 oz)    Intake/Output: I/O last 3 completed shifts: In: 1853.5 [P.O.:120; I.V.:1087.5; IV Piggyback:646] Out: 600 [Urine:600]   Intake/Output this shift:  No intake/output data recorded.  Physical Exam: General: No acute distress  Head: Normocephalic, atraumatic. Moist oral mucosal membranes  Eyes: Anicteric  Neck: Supple, trachea midline  Lungs:  Clear to auscultation, normal effort  Heart: S1S2 no rubs  Abdomen:  Soft, nontender, bowel sounds present  Extremities: No peripheral edema.  Neurologic: Lethargic but arousable. Speech mumbled.   Skin: No lesions       Basic Metabolic Panel:  Recent Labs Lab 07/18/2016 1505 07-18-2016 2150 07/16/16 0153 07/16/16 1610 07/16/16 1457 07/16/16 2018 07/17/16 0144 07/17/16 0817  NA 122* 123* 121* 122* 123*  124* 122* 124* 125*  K 3.5 3.0* 3.3* 3.4* 3.8  --   --   --   CL 87* 86* 86* 91* 88*  --   --   --   CO2 24 27 25 22 25   --   --   --   GLUCOSE 136* 132* 144* 131* 209*  --   --   --   BUN 24* 24* 23* 22* 19  --   --   --   CREATININE 1.07* 1.16* 1.10* 0.92 0.81  --   --   --   CALCIUM 8.8* 8.4* 8.4* 8.1* 8.5*  --   --   --   MG  --   --   --  2.2  --   --   --   --     Liver Function Tests:  Recent Labs Lab 18-Jul-2016 1505  AST 96*  ALT 100*  ALKPHOS  70  BILITOT 4.0*  PROT 7.8  ALBUMIN 3.3*   No results for input(s): LIPASE, AMYLASE in the last 168 hours. No results for input(s): AMMONIA in the last 168 hours.  CBC:  Recent Labs Lab Jul 18, 2016 1505 07/16/16 0153  WBC 9.1 7.5  NEUTROABS 6.2  --   HGB 15.1 14.9  HCT 43.8 43.4  MCV 91.2 91.3  PLT 218 193    Cardiac Enzymes:  Recent Labs Lab 07/18/2016 1505  TROPONINI <0.03    BNP: Invalid input(s): POCBNP  CBG:  Recent Labs Lab 07/12/16 2212 07/13/16 0752 07/13/16 1159 07/16/16 0548 07/16/16 0738  GLUCAP 174* 171* 184* 119* 128*    Microbiology: Results for orders placed or performed during the hospital encounter of 07/18/2016  Blood culture (routine x 2)     Status: None (Preliminary result)   Collection Time: 2016-07-18  4:56 PM  Result Value Ref Range Status   Specimen Description BLOOD LAC  Final   Special Requests   Final    BOTTLES DRAWN AEROBIC AND  ANAEROBIC Blood Culture results may not be optimal due to an inadequate volume of blood received in culture bottles   Culture NO GROWTH 2 DAYS  Final   Report Status PENDING  Incomplete  Blood culture (routine x 2)     Status: None (Preliminary result)   Collection Time: 07-20-16  4:56 PM  Result Value Ref Range Status   Specimen Description BLOOD R HAND  Final   Special Requests   Final    BOTTLES DRAWN AEROBIC AND ANAEROBIC Blood Culture results may not be optimal due to an inadequate volume of blood received in culture bottles   Culture  Setup Time   Final    Organism ID to follow GRAM POSITIVE COCCI IN BOTH AEROBIC AND ANAEROBIC BOTTLES CRITICAL RESULT CALLED TO, READ BACK BY AND VERIFIED WITH: NATE COOKSON AT 1558 ON 07/16/2016 JJB    Culture GRAM POSITIVE COCCI  Final   Report Status PENDING  Incomplete  Blood Culture ID Panel (Reflexed)     Status: Abnormal   Collection Time: 07-20-16  4:56 PM  Result Value Ref Range Status   Enterococcus species NOT DETECTED NOT DETECTED Final   Listeria  monocytogenes NOT DETECTED NOT DETECTED Final   Staphylococcus species DETECTED (A) NOT DETECTED Final    Comment: Methicillin (oxacillin) resistant coagulase negative staphylococcus. Possible blood culture contaminant (unless isolated from more than one blood culture draw or clinical case suggests pathogenicity). No antibiotic treatment is indicated for blood  culture contaminants. CRITICAL RESULT CALLED TO, READ BACK BY AND VERIFIED WITH: NATE COOKSON AT 1558 ON 07/16/2016 JJB    Staphylococcus aureus NOT DETECTED NOT DETECTED Final   Methicillin resistance DETECTED (A) NOT DETECTED Final    Comment: CRITICAL RESULT CALLED TO, READ BACK BY AND VERIFIED WITH: NATE COOKSON AT 1558 ON 07/16/2016 JJB    Streptococcus species NOT DETECTED NOT DETECTED Final   Streptococcus agalactiae NOT DETECTED NOT DETECTED Final   Streptococcus pneumoniae NOT DETECTED NOT DETECTED Final   Streptococcus pyogenes NOT DETECTED NOT DETECTED Final   Acinetobacter baumannii NOT DETECTED NOT DETECTED Final   Enterobacteriaceae species NOT DETECTED NOT DETECTED Final   Enterobacter cloacae complex NOT DETECTED NOT DETECTED Final   Escherichia coli NOT DETECTED NOT DETECTED Final   Klebsiella oxytoca NOT DETECTED NOT DETECTED Final   Klebsiella pneumoniae NOT DETECTED NOT DETECTED Final   Proteus species NOT DETECTED NOT DETECTED Final   Serratia marcescens NOT DETECTED NOT DETECTED Final   Haemophilus influenzae NOT DETECTED NOT DETECTED Final   Neisseria meningitidis NOT DETECTED NOT DETECTED Final   Pseudomonas aeruginosa NOT DETECTED NOT DETECTED Final   Candida albicans NOT DETECTED NOT DETECTED Final   Candida glabrata NOT DETECTED NOT DETECTED Final   Candida krusei NOT DETECTED NOT DETECTED Final   Candida parapsilosis NOT DETECTED NOT DETECTED Final   Candida tropicalis NOT DETECTED NOT DETECTED Final    Coagulation Studies: No results for input(s): LABPROT, INR in the last 72 hours.  Urinalysis: No  results for input(s): COLORURINE, LABSPEC, PHURINE, GLUCOSEU, HGBUR, BILIRUBINUR, KETONESUR, PROTEINUR, UROBILINOGEN, NITRITE, LEUKOCYTESUR in the last 72 hours.  Invalid input(s): APPERANCEUR    Imaging: Dg Chest 2 View  Result Date: 07/20/16 CLINICAL DATA:  Recently hospitalized with pneumonia. Patient returns with shortness of breath. History of hypertension and HIV. EXAM: CHEST  2 VIEW COMPARISON:  Chest x-ray of Jul 09, 2016 FINDINGS: The lungs are reasonably well inflated. There is a markings are increased. There is are areas of atelectasis or  linear infiltrate at the lung bases similar to that seen on May 31st. There is no large pleural effusion. The cardiac silhouette is mildly enlarged. There is calcification in the wall of the thoracic aorta. There is multilevel degenerative disc disease of the thoracic spine. IMPRESSION: Bilateral interstitial pneumonia greatest at the lung bases. No CHF. The appearance of the chest has deteriorated since the PA and lateral chest x-ray of Jul 04, 2016 and is slightly worse than on the the Jul 09, 2016 study. Electronically Signed   By: David  SwazilandJordan M.D.   On: 07/14/2016 16:07   Ct Abdomen Pelvis W Contrast  Result Date: 07/14/2016 CLINICAL DATA:  Acute right lower quadrant pain. EXAM: CT ABDOMEN AND PELVIS WITH CONTRAST TECHNIQUE: Multidetector CT imaging of the abdomen and pelvis was performed using the standard protocol following bolus administration of intravenous contrast. CONTRAST:  100mL ISOVUE-300 IOPAMIDOL (ISOVUE-300) INJECTION 61% COMPARISON:  01/24/2013 FINDINGS: Lower chest: Cardiomegaly. Subsegmental atelectasis or scarring at the bases. Hepatobiliary: No focal liver abnormality.Cholelithiasis. No evidence of acute cholecystitis or biliary obstruction Pancreas: Unremarkable. Spleen: Unremarkable. Adrenals/Urinary Tract: Negative adrenals. No hydronephrosis or stone. 18 mm simple appearing left renal cyst. Unremarkable bladder. Stomach/Bowel: No  obstruction. Moderate colonic gas and stool. No pericecal or other inflammation. Vascular/Lymphatic: Narrow appearance of the main portal vein that is chronic. 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 No mass or adenopathy. Reproductive:Hysterectomy and possible right oophorectomy. There is a left adnexal cyst measuring 4.1 cm. Enhancing tissue along the left aspect of the wall could be residual ovary. This cyst had internal complex material in 2014 pelvic sonography. [facility] May [facility], when complexity was also noted at that time. Other: No ascites or pneumoperitoneum. Musculoskeletal: Spondylosis and facet arthropathy. No acute or aggressive finding. IMPRESSION: 1. No acute finding. 2. Cholelithiasis without signs of cholecystitis. 3. 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 4. Chronic left adnexal cyst measuring up to 4.1 cm, size stable from 2009. Recommend yearly pelvic sonography. Electronically Signed   By: Marnee SpringJonathon  Watts M.D.   On: 08/01/2016 16:58   [place] Abdomen Limited Ruq  Result Date: 07/14/2016 CLINICAL DATA:  Hyperbilirubinemia.  Known cholelithiasis. EXAM: ULTRASOUND ABDOMEN LIMITED RIGHT UPPER QUADRANT COMPARISON:  CT abdomen pelvis performed earlier today, 01/24/2013 and earlier. Right upper quadrant abdominal ultrasound 02/01/2013, 01/24/2013 and earlier. FINDINGS: Gallbladder: Multiple echogenic gallstones, the largest on the order of 10 mm. No gallbladder wall thickening or pericholecystic fluid. Negative sonographic Murphy's sign according to the ultrasound technologist. Common bile duct: Diameter: Approximately 5 mm proximally. No visible stones in the proximal duct. The mid and distal duct are obscured by duodenal bowel gas. Liver: Scattered geographic areas of increased parenchymal echotexture. No parenchymal masses. Patent though narrowed main portal vein with  hepatopetal flow (as noted on the earlier CT). IMPRESSION: 1. Cholelithiasis without sonographic evidence of acute cholecystitis. 2. No evidence of biliary ductal dilation. 3. Scattered areas of focal hepatic steatosis. No hepatic masses. Narrowed main portal vein with hepatopetal flow (as noted on the earlier CT). Electronically Signed   By: Hulan Saashomas  Lawrence M.D.   On: 08/02/2016 19:13     Medications:   . ceFEPime (MAXIPIME) IV    . fluconazole (DIFLUCAN) IV Stopped (07/17/16 1145)  . vancomycin Stopped (07/17/16 1146)   . amitriptyline  25 mg Oral QHS  . bictegravir-emtricitabine-tenofovir AF  1 tablet Oral Daily  . darunavir-cobicistat  1 tablet Oral Q breakfast  . docusate  100 mg Oral BID  . furosemide  40 mg Oral Daily  . heparin  5,000 Units Subcutaneous Q8H  . ipratropium-albuterol  3 mL Nebulization TID  . [START ON 07/18/2016] predniSONE  40 mg Oral Q breakfast   Followed by  . [START ON 07/23/2016] predniSONE  20 mg Oral Q breakfast  . predniSONE  40 mg Oral BID WC  . QUEtiapine  25 mg Oral QHS  . ramipril  10 mg Oral QPM  . sodium chloride  1 g Oral BID WC  . sulfamethoxazole-trimethoprim  2 tablet Oral TID   acetaminophen **OR** acetaminophen, bisacodyl, ondansetron **OR** ondansetron (ZOFRAN) IV, traZODone  Assessment/ Plan:  66 y.o. female with a PMHX of HIV, hepatitis C, crack cocaine dependency, recent PJP lung infection, hypertension, peripheral neuropathy, GERD.  1.  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 2.  Hypokalemia. 3.  Hypertension. 4.  HIV, uncontrolled.  5.  Hepatitis C with liver dysfunction.  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 Urine sodium was normal and uric acid was low at 2.9. Nursing states that she's had difficulty taking by mouth given thrush. Given the fact that serum sodium has come up no urgent need for 3% saline at the moment.  Continue salt  tablets, Lasix, and furosemide as tolerated. Treatment of underlying thrush as per hospitalist and infectious disease. Further plan as patient progresses.   LOS: 2 Kayton Dunaj 6/8/201812:55 PM

## 2016-07-18 ENCOUNTER — Inpatient Hospital Stay (HOSPITAL_COMMUNITY)
Admit: 2016-07-18 | Discharge: 2016-07-18 | Disposition: A | Discharge status: Home / Self Care | Payer: Medicaid Other | Attending: Pulmonary Disease | Admitting: Pulmonary Disease

## 2016-07-18 ENCOUNTER — Inpatient Hospital Stay: Payer: Medicaid Other

## 2016-07-18 DIAGNOSIS — R579 Shock, unspecified: Secondary | ICD-10-CM

## 2016-07-18 DIAGNOSIS — N179 Acute kidney failure, unspecified: Secondary | ICD-10-CM

## 2016-07-18 DIAGNOSIS — I361 Nonrheumatic tricuspid (valve) insufficiency: Secondary | ICD-10-CM

## 2016-07-18 DIAGNOSIS — J9600 Acute respiratory failure, unspecified whether with hypoxia or hypercapnia: Secondary | ICD-10-CM

## 2016-07-18 DIAGNOSIS — I469 Cardiac arrest, cause unspecified: Secondary | ICD-10-CM

## 2016-07-18 LAB — COMPREHENSIVE METABOLIC PANEL
ALT: 266 U/L — ABNORMAL HIGH (ref 14–54)
AST: 318 U/L — ABNORMAL HIGH (ref 15–41)
Albumin: 1.4 g/dL — ABNORMAL LOW (ref 3.5–5.0)
Alkaline Phosphatase: 49 U/L (ref 38–126)
Anion gap: 19 — ABNORMAL HIGH (ref 5–15)
BUN: 69 mg/dL — ABNORMAL HIGH (ref 6–20)
CHLORIDE: 103 mmol/L (ref 101–111)
CO2: 10 mmol/L — ABNORMAL LOW (ref 22–32)
Calcium: 6.4 mg/dL — CL (ref 8.9–10.3)
Creatinine, Ser: 2.46 mg/dL — ABNORMAL HIGH (ref 0.44–1.00)
GFR calc Af Amer: 23 mL/min — ABNORMAL LOW (ref >60)
GFR, EST NON AFRICAN AMERICAN: 20 mL/min — AB (ref >60)
Glucose, Bld: 188 mg/dL — ABNORMAL HIGH (ref 65–99)
POTASSIUM: 4.9 mmol/L (ref 3.5–5.1)
Sodium: 132 mmol/L — ABNORMAL LOW (ref 135–145)
Total Bilirubin: 1.9 mg/dL — ABNORMAL HIGH (ref 0.3–1.2)
Total Protein: 3.3 g/dL — ABNORMAL LOW (ref 6.5–8.1)

## 2016-07-18 LAB — ECHOCARDIOGRAM COMPLETE
HEIGHTINCHES: 61 in
WEIGHTICAEL: 3291.03 [oz_av]

## 2016-07-18 LAB — TROPONIN I: TROPONIN I: 0.32 ng/mL — AB (ref <0.03)

## 2016-07-18 LAB — CULTURE, BLOOD (ROUTINE X 2)

## 2016-07-18 LAB — HEMOGLOBIN AND HEMATOCRIT, BLOOD
HCT: 22.6 % — ABNORMAL LOW (ref 35.0–47.0)
HEMOGLOBIN: 7.2 g/dL — AB (ref 12.0–16.0)

## 2016-07-18 LAB — GLUCOSE, CAPILLARY
GLUCOSE-CAPILLARY: 125 mg/dL — AB (ref 65–99)
GLUCOSE-CAPILLARY: 328 mg/dL — AB (ref 65–99)
Glucose-Capillary: 144 mg/dL — ABNORMAL HIGH (ref 65–99)
Glucose-Capillary: 281 mg/dL — ABNORMAL HIGH (ref 65–99)
Glucose-Capillary: 378 mg/dL — ABNORMAL HIGH (ref 65–99)

## 2016-07-18 LAB — PROTIME-INR
INR: 2.76
PROTHROMBIN TIME: 29.7 s — AB (ref 11.4–15.2)

## 2016-07-18 LAB — CBC
HCT: 22 % — ABNORMAL LOW (ref 35.0–47.0)
Hemoglobin: 6.9 g/dL — ABNORMAL LOW (ref 12.0–16.0)
MCH: 30.9 pg (ref 26.0–34.0)
MCHC: 31.5 g/dL — AB (ref 32.0–36.0)
MCV: 98.3 fL (ref 80.0–100.0)
PLATELETS: 150 10*3/uL (ref 150–440)
RBC: 2.24 MIL/uL — ABNORMAL LOW (ref 3.80–5.20)
RDW: 15.9 % — AB (ref 11.5–14.5)
WBC: 17.8 10*3/uL — ABNORMAL HIGH (ref 3.6–11.0)

## 2016-07-18 LAB — ABO/RH: ABO/RH(D): A POS

## 2016-07-18 LAB — APTT

## 2016-07-18 LAB — PREPARE RBC (CROSSMATCH)

## 2016-07-18 LAB — SODIUM
SODIUM: 132 mmol/L — AB (ref 135–145)
Sodium: 128 mmol/L — ABNORMAL LOW (ref 135–145)

## 2016-07-18 LAB — TRIGLYCERIDES: Triglycerides: 116 mg/dL (ref <150)

## 2016-07-18 LAB — MRSA PCR SCREENING: MRSA by PCR: NEGATIVE

## 2016-07-18 MED ORDER — SODIUM CHLORIDE 0.9 % IV SOLN
Freq: Once | INTRAVENOUS | Status: AC
  Administered 2016-07-18: 16:00:00 via INTRAVENOUS

## 2016-07-18 MED ORDER — HEPARIN (PORCINE) IN NACL 100-0.45 UNIT/ML-% IJ SOLN: 1100.0000 [IU]/h | INTRAMUSCULAR | Status: DC

## 2016-07-18 MED ORDER — HYDROCORTISONE NA SUCCINATE PF 100 MG IJ SOLR
50.0000 mg | Freq: Four times a day (QID) | INTRAMUSCULAR | Status: DC
  Administered 2016-07-18 (×3): 50 mg via INTRAVENOUS
  Filled 2016-07-18 (×3): qty 2

## 2016-07-18 MED ORDER — PANTOPRAZOLE SODIUM 40 MG IV SOLR
40.0000 mg | INTRAVENOUS | Status: DC
  Administered 2016-07-18: 40 mg via INTRAVENOUS
  Filled 2016-07-18: qty 40

## 2016-07-18 MED ORDER — DOPAMINE-DEXTROSE 3.2-5 MG/ML-% IV SOLN: 0.0000 ug/kg/min | INTRAVENOUS | Status: DC

## 2016-07-18 MED ORDER — PROPOFOL 1000 MG/100ML IV EMUL: 5.0000 ug/kg/min | INTRAVENOUS | Status: DC

## 2016-07-18 MED ORDER — HEPARIN BOLUS VIA INFUSION: 3500.0000 [IU] | Freq: Once | INTRAVENOUS | Status: DC

## 2016-07-18 MED ORDER — IPRATROPIUM-ALBUTEROL 0.5-2.5 (3) MG/3ML IN SOLN: 3.0000 mL | RESPIRATORY_TRACT | Status: DC | PRN

## 2016-07-18 MED ORDER — SODIUM CHLORIDE 0.9% FLUSH
10.0000 mL | Freq: Two times a day (BID) | INTRAVENOUS | Status: DC
  Administered 2016-07-18: 10 mL

## 2016-07-18 MED ORDER — INSULIN ASPART 100 UNIT/ML ~~LOC~~ SOLN
0.0000 [IU] | SUBCUTANEOUS | Status: DC
  Administered 2016-07-18: 11 [IU] via SUBCUTANEOUS
  Administered 2016-07-18: 8 [IU] via SUBCUTANEOUS
  Administered 2016-07-18: 2 [IU] via SUBCUTANEOUS
  Filled 2016-07-18: qty 11
  Filled 2016-07-18: qty 2
  Filled 2016-07-18: qty 8

## 2016-07-18 MED ORDER — SODIUM CHLORIDE 0.9% FLUSH: 10.0000 mL | INTRAVENOUS | Status: DC | PRN

## 2016-07-18 MED ORDER — ACETAMINOPHEN 325 MG PO TABS: 650.0000 mg | ORAL_TABLET | Freq: Four times a day (QID) | ORAL | Status: DC | PRN

## 2016-07-18 MED ORDER — DOPAMINE HCL 40 MG/ML IV SOLN
0.0000 ug/kg/min | INTRAVENOUS | Status: DC
  Administered 2016-07-18: 20 ug/kg/min via INTRAVENOUS
  Filled 2016-07-18: qty 20

## 2016-07-18 MED ORDER — STERILE WATER FOR INJECTION IV SOLN
INTRAVENOUS | Status: DC
  Administered 2016-07-18: 16:00:00 via INTRAVENOUS
  Filled 2016-07-18 (×3): qty 850

## 2016-07-18 MED ORDER — SODIUM CHLORIDE 0.9 % IV SOLN
INTRAVENOUS | Status: DC
  Administered 2016-07-18: 999 mL/h via INTRAVENOUS

## 2016-07-18 MED ORDER — ORAL CARE MOUTH RINSE
15.0000 mL | OROMUCOSAL | Status: DC
  Administered 2016-07-18 – 2016-07-19 (×2): 15 mL via OROMUCOSAL

## 2016-07-18 MED ORDER — HEPARIN (PORCINE) IN NACL 100-0.45 UNIT/ML-% IJ SOLN
1100.0000 [IU]/h | INTRAMUSCULAR | Status: DC
  Administered 2016-07-18 (×2): 1100 [IU]/h via INTRAVENOUS
  Filled 2016-07-18: qty 250

## 2016-07-18 MED ORDER — NOREPINEPHRINE BITARTRATE 1 MG/ML IV SOLN
0.0000 ug/min | INTRAVENOUS | Status: DC
  Administered 2016-07-18 (×3): 40 ug/min via INTRAVENOUS
  Filled 2016-07-18 (×3): qty 16

## 2016-07-18 MED ORDER — DOPAMINE-DEXTROSE 3.2-5 MG/ML-% IV SOLN
0.0000 ug/kg/min | INTRAVENOUS | Status: DC
  Administered 2016-07-18: 15 ug/kg/min via INTRAVENOUS
  Administered 2016-07-18: 20 ug/kg/min via INTRAVENOUS
  Administered 2016-07-18: 10 ug/kg/min via INTRAVENOUS
  Filled 2016-07-18 (×4): qty 250

## 2016-07-18 MED ORDER — CHLORHEXIDINE GLUCONATE 0.12% ORAL RINSE (MEDLINE KIT)
15.0000 mL | Freq: Two times a day (BID) | OROMUCOSAL | Status: DC
  Administered 2016-07-18: 15 mL via OROMUCOSAL

## 2016-07-18 MED ORDER — DOPAMINE-DEXTROSE 3.2-5 MG/ML-% IV SOLN
INTRAVENOUS | Status: AC
Start: 2016-07-18 — End: 2016-07-18
  Administered 2016-07-18: 800 mg
  Filled 2016-07-18: qty 250

## 2016-07-18 MED ORDER — EPINEPHRINE PF 1 MG/ML IJ SOLN
0.5000 ug/min | INTRAVENOUS | Status: DC
  Administered 2016-07-18: 20 ug/min via INTRAVENOUS
  Administered 2016-07-18: 10 ug/min via INTRAVENOUS
  Administered 2016-07-18: 20 ug/min via INTRAVENOUS
  Administered 2016-07-18 – 2016-07-19 (×4): 40 ug/min via INTRAVENOUS
  Filled 2016-07-18 (×8): qty 4

## 2016-07-18 MED ORDER — DEXTROSE 5 % IV SOLN
0.0000 ug/min | INTRAVENOUS | Status: DC
  Administered 2016-07-18: 30 ug/min via INTRAVENOUS
  Administered 2016-07-18: 40 ug/min via INTRAVENOUS
  Filled 2016-07-18 (×2): qty 4

## 2016-07-18 MED ORDER — HEPARIN BOLUS VIA INFUSION
5000.0000 [IU] | Freq: Once | INTRAVENOUS | Status: AC
  Administered 2016-07-18: 5000 [IU] via INTRAVENOUS

## 2016-07-18 MED FILL — Medication: Qty: 1 | Status: AC

## 2016-07-18 NOTE — Progress Notes (Signed)
CH paged by ICU nurse. PT returned to ICU after a code blue this morning at approx. 7:00 am. No family was present. Two friends were with PT. CH offered prayer support and encouragement to PT and friends.   07/18/16 1000  Clinical Encounter Type  Visited With Patient and family together  Visit Type Initial  Referral From Nurse  Consult/Referral To Chaplain  Spiritual Encounters  Spiritual Needs Prayer;Emotional

## 2016-07-18 NOTE — Progress Notes (Signed)
[place] at [facility]                                                                                                                                                                                  Patient Demographics   [name], is a 66 y.o. female, DOB - 04-29-1950, XIH:[id number]  Admit date - 07/22/2016   Admitting Physician Epifanio Lesches, MD  Outpatient Primary MD for the patient is [facility], [facility]   LOS - 3  Subjective: Had cardiac arrest and PE at this morning, still sedated and intubated and transferred to intensive care unit. Now on full vent support, on dopamine, Levophed, hypothermic and has been off and on. Patient also started on heparin drip for possible PE that could have caused PEA. Right now she is DO NOT RESUSCITATE, family met with  ICU physician,  Review of Systems:   CONSTITUTIONAL:intubated   Vitals:   Vitals:   07/18/16 0930 07/18/16 0945 07/18/16 1000 07/18/16 1055  BP: (!) 53/31 (!) 72/62 (!) 61/28 (!) 40/23  Pulse:    (!) 126  Resp: 14 16 18  (!) 23  Temp: (!) 91.9 F (33.3 C) (!) 91.2 F (32.9 C) (!) 90.9 F (32.7 C) (!) 90.1 F (32.3 C)  TempSrc:      SpO2:    (!) 87%  Weight:      Height:        Wt Readings from Last 3 Encounters:  07/18/16 93.3 kg (205 lb 11 oz)  07/04/16 96.2 kg (212 lb)     Intake/Output Summary (Last 24 hours) at 07/18/16 1118 Last data filed at 07/18/16 0221  Gross per 24 hour  Intake              567 ml  Output                0 ml  Net              567 ml    Physical Exam:   GENERAL: Confused  HEAD, EYES, EARS, NOSE AND THROAT: Atraumatic, normocephalit. Sclerae anicteric. No conjunctival injection. No oro-pharyngeal erythema. Intubated. NECK: Supple. There is no jugular venous distention. No bruits, no lymphadenopathy, no thyromegaly.  HEART: Regular rate and rhythm,. No murmurs, no rubs, no clicks.  LUNGS: Rhonchus breath sounds bilaterally.   ABDOMEN: Soft, flat, nontender, nondistended. Has good bowel sounds. No hepatosplenomegaly appreciated.  EXTREMITIES: No evidence of any cyanosis, clubbing, or peripheral edema.  +2 pedal and radial pulses bilaterally.  NEUROLOGIC:  Intubated. SKIN: Moist and warm with no rashes appreciated.  Psych:intubated,had PEA  And cardiac arrest this am. LN: No inguinal LN  enlargement    Antibiotics   Anti-infectives    Start     Dose/Rate Route Frequency Ordered Stop   07/17/16 2330  sulfamethoxazole-trimethoprim (BACTRIM) 320 mg in dextrose 5 % 500 mL IVPB  Status:  Discontinued     320 mg 346.7 mL/hr over 90 Minutes Intravenous Every 8 hours 07/17/16 2326 07/17/16 2328   07/17/16 2330  sulfamethoxazole-trimethoprim (BACTRIM) 320 mg in dextrose 5 % 500 mL IVPB     320 mg 346.7 mL/hr over 90 Minutes Intravenous Every 8 hours 07/17/16 2328     07/17/16 1400  ceFEPIme (MAXIPIME) 2 g in dextrose 5 % 50 mL IVPB  Status:  Discontinued     2 g 100 mL/hr over 30 Minutes Intravenous Every 8 hours 07/17/16 0818 07/17/16 1432   07/17/16 0900  vancomycin (VANCOCIN) IVPB 750 mg/150 ml premix  Status:  Discontinued     750 mg 150 mL/hr over 60 Minutes Intravenous Every 12 hours 07/17/16 0816 07/17/16 1432   07/16/16 1200  fluconazole (DIFLUCAN) IVPB 100 mg     100 mg 50 mL/hr over 60 Minutes Intravenous Daily 07/16/16 1052     07/16/16 1000  bictegravir-emtricitabine-tenofovir AF (BIKTARVY) 50-200-25 MG per tablet 1 tablet  Status:  Discontinued     1 tablet Oral Daily 07/16/16 0428 07/18/16 0856   07/16/16 0800  darunavir-cobicistat (PREZCOBIX) 800-150 MG per tablet 1 tablet  Status:  Discontinued    Comments:  Swallow whole. Do NOT crush, break or chew tablets. Take with food.     1 tablet Oral Daily with breakfast 07/22/2016 1953 07/18/16 0856   07/16/16 0700  sulfamethoxazole-trimethoprim (BACTRIM DS,SEPTRA DS) 800-160 MG per tablet 2 tablet  Status:  Discontinued     2 tablet Oral 3 times daily  07/16/16 0147 07/17/16 2326   07/16/16 0600  ceFEPIme (MAXIPIME) 2 g in dextrose 5 % 50 mL IVPB  Status:  Discontinued     2 g 100 mL/hr over 30 Minutes Intravenous Every 12 hours 08/08/2016 2016 07/17/16 0818   07/16/16 0000  sulfamethoxazole-trimethoprim (BACTRIM DS,SEPTRA DS) 800-160 MG per tablet 2 tablet  Status:  Discontinued     2 tablet Oral 3 times daily 07/23/2016 1953 07/16/16 0147   07/22/2016 2300  vancomycin (VANCOCIN) IVPB 1000 mg/200 mL premix  Status:  Discontinued     1,000 mg 200 mL/hr over 60 Minutes Intravenous Every 18 hours 07/22/2016 2016 07/17/16 0815   07/25/2016 2015  sulfamethoxazole-trimethoprim (BACTRIM,SEPTRA) 400-80 MG per tablet 1 tablet  Status:  Discontinued     1 tablet Oral  Once 07/22/2016 2005 08/02/2016 2009   07/18/2016 2015  sulfamethoxazole-trimethoprim (BACTRIM DS,SEPTRA DS) 800-160 MG per tablet 1 tablet     1 tablet Oral  Once 07/25/2016 2009 07/22/2016 2321   07/30/2016 2000  dolutegravir (TIVICAY) tablet 50 mg  Status:  Discontinued     50 mg Oral Daily 07/20/2016 1953 07/16/16 1447   07/11/2016 1645  ceFEPIme (MAXIPIME) 2 g in dextrose 5 % 50 mL IVPB     2 g 100 mL/hr over 30 Minutes Intravenous  Once 08/01/2016 1643 07/18/2016 2328   07/29/2016 1645  vancomycin (VANCOCIN) IVPB 1000 mg/200 mL premix     1,000 mg 200 mL/hr over 60 Minutes Intravenous  Once 07/17/2016 1643 07/27/2016 2328   07/31/2016 1645  sulfamethoxazole-trimethoprim (BACTRIM DS,SEPTRA DS) 800-160 MG per tablet 1 tablet     1 tablet Oral  Once 08/08/2016 1643 07/11/2016 1700      Medications   Scheduled  Meds: . hydrocortisone sod succinate (SOLU-CORTEF) inj  50 mg Intravenous Q6H  . insulin aspart  0-15 Units Subcutaneous Q4H  . mouth rinse  15 mL Mouth Rinse BID   Continuous Infusions: . DOPamine    . DOPamine 20 mcg/kg/min (07/18/16 0722)  . epinephrine    . fluconazole (DIFLUCAN) IV Stopped (07/17/16 1145)  . heparin 1,100 Units/hr (07/18/16 1035)  . norepinephrine (LEVOPHED) Adult infusion 40  mcg/min (07/18/16 1035)  . propofol (DIPRIVAN) infusion    . sulfamethoxazole-trimethoprim Stopped (07/18/16 0221)   PRN Meds:.acetaminophen **OR** [DISCONTINUED] acetaminophen, bisacodyl, ipratropium-albuterol, [DISCONTINUED] ondansetron **OR** ondansetron (ZOFRAN) IV   Data Review:   Micro Results Recent Results (from the past 240 hour(s))  Culture, expectorated sputum-assessment     Status: None   Collection Time: 07/08/16 12:48 PM  Result Value Ref Range Status   Specimen Description EXPECTORATED SPUTUM  Final   Special Requests Immunocompromised  Final   Sputum evaluation THIS SPECIMEN IS ACCEPTABLE FOR SPUTUM CULTURE  Final   Report Status 07/09/2016 FINAL  Final  Culture, respiratory (NON-Expectorated)     Status: None   Collection Time: 07/08/16 12:48 PM  Result Value Ref Range Status   Specimen Description EXPECTORATED SPUTUM  Final   Special Requests Immunocompromised Reflexed from [id number]  Final   Gram Stain   Final    MODERATE WBC PRESENT,BOTH PMN AND MONONUCLEAR RARE GRAM POSITIVE COCCI IN PAIRS RARE GRAM POSITIVE RODS    Culture   Final    Consistent with normal respiratory flora. Performed at [facility], [place] [address] [place], [place] [address]    Report Status 07/11/2016 FINAL  Final  Blood culture (routine x 2)     Status: None (Preliminary result)   Collection Time: 07/22/2016  4:56 PM  Result Value Ref Range Status   Specimen Description BLOOD LAC  Final   Special Requests   Final    BOTTLES DRAWN AEROBIC AND ANAEROBIC Blood Culture results may not be optimal due to an inadequate volume of blood received in culture bottles   Culture NO GROWTH 3 DAYS  Final   Report Status PENDING  Incomplete  Blood culture (routine x 2)     Status: Abnormal (Preliminary result)   Collection Time: 07/27/2016  4:56 PM  Result Value Ref Range Status   Specimen Description BLOOD R HAND  Final   Special Requests   Final    BOTTLES DRAWN AEROBIC AND ANAEROBIC Blood  Culture results may not be optimal due to an inadequate volume of blood received in culture bottles   Culture  Setup Time   Final    GRAM POSITIVE COCCI IN BOTH AEROBIC AND ANAEROBIC BOTTLES CRITICAL RESULT CALLED TO, READ BACK BY AND VERIFIED WITH: NATE COOKSON AT [id number] ON 07/16/2016 JJB    Culture (A)  Final    STAPHYLOCOCCUS SPECIES (COAGULASE NEGATIVE) THE SIGNIFICANCE OF ISOLATING THIS ORGANISM FROM A SINGLE SET OF BLOOD CULTURES WHEN MULTIPLE SETS ARE DRAWN IS UNCERTAIN. PLEASE NOTIFY THE MICROBIOLOGY DEPARTMENT WITHIN ONE WEEK IF SPECIATION AND SENSITIVITIES ARE REQUIRED. Performed at [facility], [place] [address], [place], [place] [address]    Report Status PENDING  Incomplete  Blood Culture ID Panel (Reflexed)     Status: Abnormal   Collection Time: 07/12/2016  4:56 PM  Result Value Ref Range Status   Enterococcus species NOT DETECTED NOT DETECTED Final   Listeria monocytogenes NOT DETECTED NOT DETECTED Final   Staphylococcus species DETECTED (A) NOT DETECTED Final  Comment: Methicillin (oxacillin) resistant coagulase negative staphylococcus. Possible blood culture contaminant (unless isolated from more than one blood culture draw or clinical case suggests pathogenicity). No antibiotic treatment is indicated for blood  culture contaminants. CRITICAL RESULT CALLED TO, READ BACK BY AND VERIFIED WITH: [place] AT [id number] ON 07/16/2016 JJB    Staphylococcus aureus NOT DETECTED NOT DETECTED Final   Methicillin resistance DETECTED (A) NOT DETECTED Final    Comment: CRITICAL RESULT CALLED TO, READ BACK BY AND VERIFIED WITH: NATE COOKSON AT [id number] ON 07/16/2016 JJB    Streptococcus species NOT DETECTED NOT DETECTED Final   Streptococcus agalactiae NOT DETECTED NOT DETECTED Final   Streptococcus pneumoniae NOT DETECTED NOT DETECTED Final   Streptococcus pyogenes NOT DETECTED NOT DETECTED Final   Acinetobacter baumannii NOT DETECTED NOT DETECTED Final   Enterobacteriaceae species NOT  DETECTED NOT DETECTED Final   Enterobacter cloacae complex NOT DETECTED NOT DETECTED Final   Escherichia coli NOT DETECTED NOT DETECTED Final   Klebsiella oxytoca NOT DETECTED NOT DETECTED Final   Klebsiella pneumoniae NOT DETECTED NOT DETECTED Final   Proteus species NOT DETECTED NOT DETECTED Final   Serratia marcescens NOT DETECTED NOT DETECTED Final   Haemophilus influenzae NOT DETECTED NOT DETECTED Final   Neisseria meningitidis NOT DETECTED NOT DETECTED Final   Pseudomonas aeruginosa NOT DETECTED NOT DETECTED Final   Candida albicans NOT DETECTED NOT DETECTED Final   Candida glabrata NOT DETECTED NOT DETECTED Final   Candida krusei NOT DETECTED NOT DETECTED Final   Candida parapsilosis NOT DETECTED NOT DETECTED Final   Candida tropicalis NOT DETECTED NOT DETECTED Final  MRSA PCR Screening     Status: None   Collection Time: 07/18/16  7:53 AM  Result Value Ref Range Status   MRSA by PCR NEGATIVE NEGATIVE Final    Comment:        The GeneXpert MRSA Assay (FDA approved for NASAL specimens only), is one component of a comprehensive MRSA colonization surveillance program. It is not intended to diagnose MRSA infection nor to guide or monitor treatment for MRSA infections.     Radiology Reports Dg Chest 2 View  Result Date: 07/22/2016 CLINICAL DATA:  Recently hospitalized with pneumonia. Patient returns with shortness of breath. History of hypertension and HIV. EXAM: CHEST  2 VIEW COMPARISON:  Chest x-ray of Jul 09, 2016 FINDINGS: The lungs are reasonably well inflated. There is a markings are increased. There is are areas of atelectasis or linear infiltrate at the lung bases similar to that seen on May 31st. There is no large pleural effusion. The cardiac silhouette is mildly enlarged. There is calcification in the wall of the thoracic aorta. There is multilevel degenerative disc disease of the thoracic spine. IMPRESSION: Bilateral interstitial pneumonia greatest at the lung bases.  No CHF. The appearance of the chest has deteriorated since the PA and lateral chest x-ray of Jul 04, 2016 and is slightly worse than on the the Jul 09, 2016 study. Electronically Signed   By: David  Martinique M.D.   On: 07/22/2016 16:07   Dg Chest 2 View  Result Date: 07/09/2016 CLINICAL DATA:  Pneumonia. EXAM: CHEST  2 VIEW COMPARISON:  Radiographs of Jul 04, 2016. FINDINGS: Stable cardiomediastinal silhouette. No pneumothorax is noted. Mildly increased bibasilar opacities are noted concerning for atelectasis or infiltrates, right greater than left. Minimal right pleural effusion may be present. Bony thorax is unremarkable. IMPRESSION: Mildly increased bibasilar atelectasis or infiltrates are noted, right greater than left. Electronically Signed   By: Jeneen Rinks  Murlean Caller, M.D.   On: 07/09/2016 08:43   Dg Chest 2 View  Result Date: 07/04/2016 CLINICAL DATA:  Cough and congestion for several days EXAM: CHEST  2 VIEW COMPARISON:  02/03/2013 FINDINGS: Cardiac shadow is mildly enlarged. Mild platelike atelectasis is noted in the bases bilaterally. Mild vascular congestion with mild interstitial edema is seen. No sizable effusion or infiltrate is noted. Degenerative changes of the thoracic spine are seen. IMPRESSION: Mild left basilar atelectasis. Mild vascular congestion interstitial edema. Electronically Signed   By: Inez Catalina M.D.   On: 07/04/2016 14:08   Dg Ribs Unilateral Right  Result Date: 07/07/2016 CLINICAL DATA:  Right lower anterior rib pain when coughing. EXAM: RIGHT RIBS - 2 VIEW COMPARISON:  Chest CT from 3 days prior FINDINGS: No fracture or other bone lesions are seen involving the ribs. Streaky opacity at the right base consistent with atelectasis. Layering gallstones. IMPRESSION: 1. Negative right rib series. 2. Atelectasis at the right base. 3. Cholelithiasis. Electronically Signed   By: Monte Fantasia M.D.   On: 07/07/2016 10:26   Dg Abd 1 View  Result Date: 07/18/2016 CLINICAL DATA:  NG  tube placement today. EXAM: ABDOMEN - 1 VIEW COMPARISON:  Single-view of the abdomen earlier today. FINDINGS: NG tube is in place with the tip in good position in the distal stomach. Gaseous distention of small bowel persists without change. Gallstones noted. IMPRESSION: NG tube in good position.  No other change. Electronically Signed   By: Inge Rise M.D.   On: 07/18/2016 09:30   Dg Abd 1 View  Result Date: 07/18/2016 CLINICAL DATA:  Report of orogastric tube placement EXAM: ABDOMEN - 1 VIEW COMPARISON:  CT abdomen and pelvis July 15, 2016 FINDINGS: No orogastric tube evident. There are gallstones in the right upper quadrant. There is no bowel dilatation or air-fluid level to suggest bowel obstruction. No free air. IMPRESSION: Bowel gas pattern unremarkable without evidence of bowel obstruction or free air. Cholelithiasis present. No orogastric tube evident. Electronically Signed   By: Lowella Grip III M.D.   On: 07/18/2016 07:45   Ct Angio Chest Pe W Or Wo Contrast  Result Date: 07/04/2016 CLINICAL DATA:  Cough and congestion for several days EXAM: CT ANGIOGRAPHY CHEST WITH CONTRAST TECHNIQUE: Multidetector CT imaging of the chest was performed using the standard protocol during bolus administration of intravenous contrast. Multiplanar CT image reconstructions and MIPs were obtained to evaluate the vascular anatomy. CONTRAST:  75 mL Isovue 370. COMPARISON:  Plain film from earlier in the same day. FINDINGS: Cardiovascular: Atherosclerotic calcifications of the aorta are noted without aneurysmal dilatation or dissection. Pulmonary artery demonstrates a normal branching pattern. No filling defects to suggest pulmonary emboli are identified. No significant cardiac enlargement is seen. No coronary calcifications are noted. Mediastinum/Nodes: No significant hilar or mediastinal adenopathy is noted. The thoracic inlet is within normal limits. Lungs/Pleura: Lungs are well aerated bilaterally. No focal  infiltrate or sizable effusion is seen. Platelike atelectasis is noted in the left lower lobe similar to that seen on recent plain film examination. No sizable effusion or pneumothorax is noted. Upper Abdomen: Mild fatty infiltration of the liver is seen. The remainder the upper abdomen is within normal limits. Musculoskeletal: Degenerative changes of the thoracic spine are noted. No acute bony abnormality is seen. Review of the MIP images confirms the above findings. IMPRESSION: No evidence of pulmonary emboli. Mild left lower lobe atelectasis similar to that seen on prior plain film examination. No focal confluent infiltrate is  seen. Electronically Signed   By: Inez Catalina M.D.   On: 07/04/2016 15:47   Ct Abdomen Pelvis W Contrast  Result Date: 07/16/2016 CLINICAL DATA:  Acute right lower quadrant pain. EXAM: CT ABDOMEN AND PELVIS WITH CONTRAST TECHNIQUE: Multidetector CT imaging of the abdomen and pelvis was performed using the standard protocol following bolus administration of intravenous contrast. CONTRAST:  169m ISOVUE-300 IOPAMIDOL (ISOVUE-300) INJECTION 61% COMPARISON:  01/24/2013 FINDINGS: Lower chest: Cardiomegaly. Subsegmental atelectasis or scarring at the bases. Hepatobiliary: No focal liver abnormality.Cholelithiasis. No evidence of acute cholecystitis or biliary obstruction Pancreas: Unremarkable. Spleen: Unremarkable. Adrenals/Urinary Tract: Negative adrenals. No hydronephrosis or stone. 18 mm simple appearing left renal cyst. Unremarkable bladder. Stomach/Bowel: No obstruction. Moderate colonic gas and stool. No pericecal or other inflammation. Vascular/Lymphatic: Narrow appearance of the main portal vein that is chronic. 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 No mass or adenopathy. Reproductive:Hysterectomy and possible right oophorectomy. There is a left adnexal cyst measuring 4.1 cm. Enhancing tissue along the left aspect of the wall could be residual  ovary. This cyst had internal complex material in 2014 pelvic sonography. [facility] complexity was also noted at that time. Other: No ascites or pneumoperitoneum. Musculoskeletal: Spondylosis and facet arthropathy. No acute or aggressive finding. IMPRESSION: 1. No acute finding. 2. Cholelithiasis without signs of cholecystitis. 3. 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 4. Chronic left adnexal cyst measuring up to 4.1 cm, size stable from 2009. Recommend yearly pelvic sonography. Electronically Signed   By: JMonte FantasiaM.D.   On: 07/31/2016 16:58   Dg Chest Port 1 View  Result Date: 07/18/2016 CLINICAL DATA:  Endotracheal tube adjustment. Central venous catheter placement. EXAM: PORTABLE CHEST 1 VIEW COMPARISON:  Chest radiograph from earlier today. FINDINGS: Endotracheal tube tip is 3.0 cm above the carina. Enteric tube terminates in the distal stomach. Right subclavian central venous catheter terminates over the right atrium. Stable cardiomediastinal silhouette with top-normal heart size. No pneumothorax. No pleural effusion. Curvilinear opacities and volume loss in the right upper lung and at the lung bases, unchanged. IMPRESSION: 1. Endotracheal tube tip 3.0 cm above the carina. 2. Enteric tube terminates in the distal stomach. 3. Right subclavian central venous catheter terminates over the right atrium. No pneumothorax. 4. Stable curvilinear lung opacities in the upper right lung and lung bases, favor scarring or atelectasis. Electronically Signed   By: JIlona SorrelM.D.   On: 07/18/2016 09:35   Dg Chest Port 1 View  Result Date: 07/18/2016 CLINICAL DATA:  Hypoxia. Reported placement of orogastric tube and endotracheal tube EXAM: PORTABLE CHEST 1 VIEW COMPARISON:  July 15, 2016 FINDINGS: Endotracheal tube tip is 1.4 cm above the carina. Orogastric tube not seen. No pneumothorax. There is atelectatic  change in the right upper lobe and left base regions. No edema or consolidation. Heart size and pulmonary vascularity are normal. No adenopathy. No bone lesions. IMPRESSION: Endotracheal tube tip is 1.4 cm above the carina. It may be prudent to consider withdrawing endotracheal tube approximately 2 cm. No orogastric tube evident. Areas of atelectatic change in the right upper lobe and left base regions. Lungs elsewhere clear. Cardiac silhouette within normal limits. These results will be called to the ordering clinician or representative by the Radiologist Assistant, and communication documented in the PACS or zVision Dashboard. Electronically Signed   By: WLowella GripIII M.D.  On: 07/18/2016 07:46   US Abdomen Limited Ruq  Result Date: 08/04/2016 CLINICAL DATA:  Hyperbilirubinemia.  Known cholelithiasis. EXAM: ULTRASOUND ABDOMEN LIMITED RIGHT UPPER QUADRANT COMPARISON:  CT abdomen pelvis performed earlier today, 01/24/2013 and earlier. Right upper quadrant abdominal ultrasound 02/01/2013, 01/24/2013 and earlier. FINDINGS: Gallbladder: Multiple echogenic gallstones, the largest on the order of 10 mm. No gallbladder wall thickening or pericholecystic fluid. Negative sonographic Murphy's sign according to the ultrasound technologist. Common bile duct: Diameter: Approximately 5 mm proximally. No visible stones in the proximal duct. The mid and distal duct are obscured by duodenal bowel gas. Liver: Scattered geographic areas of increased parenchymal echotexture. No parenchymal masses. Patent though narrowed main portal vein with hepatopetal flow (as noted on the earlier CT). IMPRESSION: 1. Cholelithiasis without sonographic evidence of acute cholecystitis. 2. No evidence of biliary ductal dilation. 3. Scattered areas of focal hepatic steatosis. No hepatic masses. Narrowed main portal vein with hepatopetal flow (as noted on the earlier CT). Electronically Signed   By: Evangeline Dakin M.D.   On: 07/14/2016  19:13     CBC  Recent Labs Lab 07/25/2016 1505 07/16/16 0153  WBC 9.1 7.5  HGB 15.1 14.9  HCT 43.8 43.4  PLT 218 193  MCV 91.2 91.3  MCH 31.4 31.2  MCHC 34.4 34.2  RDW 14.9* 14.9*  LYMPHSABS 1.4  --   MONOABS 1.5*  --   EOSABS 0.0  --   BASOSABS 0.0  --     Chemistries   Recent Labs Lab 07/24/2016 1505 07/22/2016 2150 07/16/16 0153 07/16/16 0822 07/16/16 1457  07/17/16 0817 07/17/16 1403 07/17/16 1936 07/18/16 0124 07/18/16 0740  NA 122* 123* 121* 122* 123*  124*  < > 125* 126* 127* 128* 132*  K 3.5 3.0* 3.3* 3.4* 3.8  --   --   --   --   --   --   CL 87* 86* 86* 91* 88*  --   --   --   --   --   --   CO2 24 27 25 22 25   --   --   --   --   --   --   GLUCOSE 136* 132* 144* 131* 209*  --   --   --   --   --   --   BUN 24* 24* 23* 22* 19  --   --   --   --   --   --   CREATININE 1.07* 1.16* 1.10* 0.92 0.81  --   --   --   --   --   --   CALCIUM 8.8* 8.4* 8.4* 8.1* 8.5*  --   --   --   --   --   --   MG  --   --   --  2.2  --   --   --   --   --   --   --   AST 96*  --   --   --   --   --   --   --   --   --   --   ALT 100*  --   --   --   --   --   --   --   --   --   --   ALKPHOS 70  --   --   --   --   --   --   --   --   --   --  BILITOT 4.0*  --   --   --   --   --   --   --   --   --   --   < > = values in this interval not displayed. ------------------------------------------------------------------------------------------------------------------ estimated creatinine clearance is 74.1 mL/min (by C-G formula based on SCr of 0.81 mg/dL). ------------------------------------------------------------------------------------------------------------------ No results for input(s): HGBA1C in the last 72 hours. ------------------------------------------------------------------------------------------------------------------  Recent Labs  07/18/16 0740  TRIG 116    ------------------------------------------------------------------------------------------------------------------  Recent Labs  07/16/16 1457  TSH 1.861   ------------------------------------------------------------------------------------------------------------------  Recent Labs  07/17/16 1543  VITAMINB12 562    Coagulation profile No results for input(s): INR, PROTIME in the last 168 hours.  No results for input(s): DDIMER in the last 72 hours.  Cardiac Enzymes  Recent Labs Lab 07/17/2016 1505  TROPONINI <0.03   ------------------------------------------------------------------------------------------------------------------ Invalid input(s): POCBNP    Assessment & Plan   1. Acute respiratory failure with hypoxia. CTAP. On antibiotics with vancomycin, cefepime, 2. 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  4. HIV. 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  5. Abnormal liver function tests continue to monitor 6. Essential hypertension now hypotensive on pressors at this time.  7. Depression  9.   Rib pain- could be costochondritis. 10. 11. Type 2 diabetes mellitus. Hemoglobin A1c 7.6. On sliding scale for now.  12. Oropharyngeal candidiasis continue therapy with fluconazole Condition is critical, high risk for cardiac arrest., Prognosis is really poor, DO NOT RESUSCITATE     Code Status Orders        Start     Ordered   07/28/2016 1952  Full code  Continuous     07/10/2016 1953    Code Status History    Date Active Date Inactive Code Status Order ID Comments User Context   07/04/2016  6:31 PM 07/13/2016  7:05 PM Full Code [id number]  Henreitta Leber, MD Inpatient           Consults  ID   DVT  Prophylaxis  Lovenox   Lab Results  Component Value Date   PLT 193 07/16/2016     Time Spent in minutes   59mn  Greater than 50% of time spent in care coordination and counseling patient regarding the condition and plan of care.   KEpifanio LeschesM.D on 07/18/2016 at 11:18 AM  Between 7am to 6pm - Pager - [phone]  After 6pm go to [email or web address] - password [email or web address]   Office  [phone]

## 2016-07-18 NOTE — Progress Notes (Signed)
1100 2 sisters in and out. Warming blanket started. Core temperature 32.3

## 2016-07-18 NOTE — Progress Notes (Addendum)
1415 Attempted left groin arterial line per Dr.Simonds.Unsuccessful.

## 2016-07-18 NOTE — Progress Notes (Signed)
[facility]  ROUNDING NOTE   Subjective:  Patient with significant deterioration. She reportedly suffered a PEA arrest early this a.m. Unclear as to how long she was actually down. She is very hypotensive.   Objective:  Vital signs in last 24 hours:  Temp:  [91.2 F (32.9 C)-98 F (36.7 C)] 91.2 F (32.9 C) (06/09 0945) Pulse Rate:  [74-129] 107 (06/09 0915) Resp:  [10-23] 16 (06/09 0945) BP: (53-149)/(23-92) 72/62 (06/09 0945) SpO2:  [75 %-97 %] 90 % (06/09 0900) FiO2 (%):  [100 %] 100 % (06/09 0858) Weight:  [93.3 kg (205 lb 11 oz)] 93.3 kg (205 lb 11 oz) (06/09 0722)  Weight change:  Filed Weights   07/20/2016 2126 07/17/16 0618 07/18/16 0722  Weight: 92.3 kg (203 lb 6.4 oz) 90.9 kg (200 lb 8 oz) 93.3 kg (205 lb 11 oz)    Intake/Output: I/O last 3 completed shifts: In: 813 [IV Piggyback:813] Out: -    Intake/Output this shift:  No intake/output data recorded.  Physical Exam: General: Critically ill appearing   Head: Endotracheal tube in place   Eyes: Eyes closed   Neck: Supple, trachea midline  Lungs:  Scattered rhonchi, vent assisted   Heart: S1S2 no rubs  Abdomen:  Soft, nontender, Scant bowel sounds   Extremities: No peripheral edema.  Neurologic: Obtunded   Skin: No lesions       Basic Metabolic Panel:  Recent Labs Lab 07/20/2016 1505 07/24/2016 2150 07/16/16 0153 07/16/16 4098 07/16/16 1457  07/17/16 0817 07/17/16 1403 07/17/16 1936 07/18/16 0124 07/18/16 0740  NA 122* 123* 121* 122* 123*  124*  < > 125* 126* 127* 128* 132*  K 3.5 3.0* 3.3* 3.4* 3.8  --   --   --   --   --   --   CL 87* 86* 86* 91* 88*  --   --   --   --   --   --   CO2 24 27 25 22 25   --   --   --   --   --   --   GLUCOSE 136* 132* 144* 131* 209*  --   --   --   --   --   --   BUN 24* 24* 23* 22* 19  --   --   --   --   --   --   CREATININE 1.07* 1.16* 1.10* 0.92 0.81  --   --   --   --   --   --   CALCIUM 8.8* 8.4* 8.4* 8.1* 8.5*  --   --   --   --   --   --    MG  --   --   --  2.2  --   --   --   --   --   --   --   < > = values in this interval not displayed.  Liver Function Tests:  Recent Labs Lab 07/18/2016 1505  AST 96*  ALT 100*  ALKPHOS 70  BILITOT 4.0*  PROT 7.8  ALBUMIN 3.3*   No results for input(s): LIPASE, AMYLASE in the last 168 hours.  Recent Labs Lab 07/17/16 1543  AMMONIA 61*    CBC:  Recent Labs Lab 07/18/2016 1505 07/16/16 0153  WBC 9.1 7.5  NEUTROABS 6.2  --   HGB 15.1 14.9  HCT 43.8 43.4  MCV 91.2 91.3  PLT 218 193    Cardiac Enzymes:  Recent Labs  Lab 08/04/2016 1505  TROPONINI <0.03    BNP: Invalid input(s): POCBNP  CBG:  Recent Labs Lab 07/13/16 0752 07/13/16 1159 07/16/16 0548 07/16/16 0738 07/18/16 0640  GLUCAP 171* 184* 119* 128* 125*    Microbiology: Results for orders placed or performed during the hospital encounter of 08/07/2016  Blood culture (routine x 2)     Status: None (Preliminary result)   Collection Time: 07/18/2016  4:56 PM  Result Value Ref Range Status   Specimen Description BLOOD LAC  Final   Special Requests   Final    BOTTLES DRAWN AEROBIC AND ANAEROBIC Blood Culture results may not be optimal due to an inadequate volume of blood received in culture bottles   Culture NO GROWTH 3 DAYS  Final   Report Status PENDING  Incomplete  Blood culture (routine x 2)     Status: None (Preliminary result)   Collection Time: 07/10/2016  4:56 PM  Result Value Ref Range Status   Specimen Description BLOOD R HAND  Final   Special Requests   Final    BOTTLES DRAWN AEROBIC AND ANAEROBIC Blood Culture results may not be optimal due to an inadequate volume of blood received in culture bottles   Culture  Setup Time   Final    GRAM POSITIVE COCCI IN BOTH AEROBIC AND ANAEROBIC BOTTLES CRITICAL RESULT CALLED TO, READ BACK BY AND VERIFIED WITH: NATE COOKSON AT 1558 ON 07/16/2016 JJB    Culture   Final    GRAM POSITIVE COCCI CULTURE REINCUBATED FOR BETTER GROWTH Performed at [facility], [address], [place], [place] [address]    Report Status PENDING  Incomplete  Blood Culture ID Panel (Reflexed)     Status: Abnormal   Collection Time: 07/12/2016  4:56 PM  Result Value Ref Range Status   Enterococcus species NOT DETECTED NOT DETECTED Final   Listeria monocytogenes NOT DETECTED NOT DETECTED Final   Staphylococcus species DETECTED (A) NOT DETECTED Final    Comment: Methicillin (oxacillin) resistant coagulase negative staphylococcus. Possible blood culture contaminant (unless isolated from more than one blood culture draw or clinical case suggests pathogenicity). No antibiotic treatment is indicated for blood  culture contaminants. CRITICAL RESULT CALLED TO, READ BACK BY AND VERIFIED WITH: NATE COOKSON AT 1558 ON 07/16/2016 JJB    Staphylococcus aureus NOT DETECTED NOT DETECTED Final   Methicillin resistance DETECTED (A) NOT DETECTED Final    Comment: CRITICAL RESULT CALLED TO, READ BACK BY AND VERIFIED WITH: NATE COOKSON AT 1558 ON 07/16/2016 JJB    Streptococcus species NOT DETECTED NOT DETECTED Final   Streptococcus agalactiae NOT DETECTED NOT DETECTED Final   Streptococcus pneumoniae NOT DETECTED NOT DETECTED Final   Streptococcus pyogenes NOT DETECTED NOT DETECTED Final   Acinetobacter baumannii NOT DETECTED NOT DETECTED Final   Enterobacteriaceae species NOT DETECTED NOT DETECTED Final   Enterobacter cloacae complex NOT DETECTED NOT DETECTED Final   Escherichia coli NOT DETECTED NOT DETECTED Final   Klebsiella oxytoca NOT DETECTED NOT DETECTED Final   Klebsiella pneumoniae NOT DETECTED NOT DETECTED Final   Proteus species NOT DETECTED NOT DETECTED Final   Serratia marcescens NOT DETECTED NOT DETECTED Final   Haemophilus influenzae NOT DETECTED NOT DETECTED Final   Neisseria meningitidis NOT DETECTED NOT DETECTED Final   Pseudomonas aeruginosa NOT DETECTED NOT DETECTED Final   Candida albicans NOT DETECTED NOT DETECTED Final   Candida glabrata NOT DETECTED  NOT DETECTED Final   Candida krusei NOT DETECTED NOT DETECTED Final  Candida parapsilosis NOT DETECTED NOT DETECTED Final   Candida tropicalis NOT DETECTED NOT DETECTED Final    Coagulation Studies: No results for input(s): LABPROT, INR in the last 72 hours.  Urinalysis: No results for input(s): COLORURINE, LABSPEC, PHURINE, GLUCOSEU, HGBUR, BILIRUBINUR, KETONESUR, PROTEINUR, UROBILINOGEN, NITRITE, LEUKOCYTESUR in the last 72 hours.  Invalid input(s): APPERANCEUR    Imaging: Dg Abd 1 View  Result Date: 07/18/2016 CLINICAL DATA:  NG tube placement today. EXAM: ABDOMEN - 1 VIEW COMPARISON:  Single-view of the abdomen earlier today. FINDINGS: NG tube is in place with the tip in good position in the distal stomach. Gaseous distention of small bowel persists without change. Gallstones noted. IMPRESSION: NG tube in good position.  No other change. Electronically Signed   By: Drusilla Kannerhomas  Dalessio M.D.   On: 07/18/2016 09:30   Dg Abd 1 View  Result Date: 07/18/2016 CLINICAL DATA:  Report of orogastric tube placement EXAM: ABDOMEN - 1 VIEW COMPARISON:  CT abdomen and pelvis July 15, 2016 FINDINGS: No orogastric tube evident. There are gallstones in the right upper quadrant. There is no bowel dilatation or air-fluid level to suggest bowel obstruction. No free air. IMPRESSION: Bowel gas pattern unremarkable without evidence of bowel obstruction or free air. Cholelithiasis present. No orogastric tube evident. Electronically Signed   By: Bretta BangWilliam  Woodruff III M.D.   On: 07/18/2016 07:45   Dg Chest Port 1 View  Result Date: 07/18/2016 CLINICAL DATA:  Endotracheal tube adjustment. Central venous catheter placement. EXAM: PORTABLE CHEST 1 VIEW COMPARISON:  Chest radiograph from earlier today. FINDINGS: Endotracheal tube tip is 3.0 cm above the carina. Enteric tube terminates in the distal stomach. Right subclavian central venous catheter terminates over the right atrium. Stable cardiomediastinal silhouette with  top-normal heart size. No pneumothorax. No pleural effusion. Curvilinear opacities and volume loss in the right upper lung and at the lung bases, unchanged. IMPRESSION: 1. Endotracheal tube tip 3.0 cm above the carina. 2. Enteric tube terminates in the distal stomach. 3. Right subclavian central venous catheter terminates over the right atrium. No pneumothorax. 4. Stable curvilinear lung opacities in the upper right lung and lung bases, favor scarring or atelectasis. Electronically Signed   By: Delbert PhenixJason A Poff M.D.   On: 07/18/2016 09:35   Dg Chest Port 1 View  Result Date: 07/18/2016 CLINICAL DATA:  Hypoxia. Reported placement of orogastric tube and endotracheal tube EXAM: PORTABLE CHEST 1 VIEW COMPARISON:  July 15, 2016 FINDINGS: Endotracheal tube tip is 1.4 cm above the carina. Orogastric tube not seen. No pneumothorax. There is atelectatic change in the right upper lobe and left base regions. No edema or consolidation. Heart size and pulmonary vascularity are normal. No adenopathy. No bone lesions. IMPRESSION: Endotracheal tube tip is 1.4 cm above the carina. It may be prudent to consider withdrawing endotracheal tube approximately 2 cm. No orogastric tube evident. Areas of atelectatic change in the right upper lobe and left base regions. Lungs elsewhere clear. Cardiac silhouette within normal limits. These results will be called to the ordering clinician or representative by the Radiologist Assistant, and communication documented in the PACS or zVision Dashboard. Electronically Signed   By: Bretta BangWilliam  Woodruff III M.D.   On: 07/18/2016 07:46     Medications:   . DOPamine    . DOPamine 20 mcg/kg/min (07/18/16 0722)  . fluconazole (DIFLUCAN) IV Stopped (07/17/16 1145)  . heparin    . norepinephrine (LEVOPHED) Adult infusion 40 mcg/min (07/18/16 0919)  . propofol (DIPRIVAN) infusion    . sulfamethoxazole-trimethoprim  Stopped (07/18/16 0221)   . hydrocortisone sod succinate (SOLU-CORTEF) inj  50 mg  Intravenous Q6H  . insulin aspart  0-15 Units Subcutaneous Q4H  . mouth rinse  15 mL Mouth Rinse BID   acetaminophen **OR** [DISCONTINUED] acetaminophen, bisacodyl, ipratropium-albuterol, [DISCONTINUED] ondansetron **OR** ondansetron (ZOFRAN) IV  Assessment/ Plan:  66 y.o. female with a PMHX of HIV, hepatitis C, crack cocaine dependency, recent PJP lung infection, hypertension, peripheral neuropathy, GERD.  1.  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 2.  Hypokalemia. 3.  Hypertension. 4.  HIV, uncontrolled.  5.  Hepatitis C with liver dysfunction. 6.  PEA arrest. 7.  Respiratory failure. 8.  MRSA sepsis.  Plan:  Unfortunately patient has had significant deterioration since yesterday. She has suffered PEA arrest. We were originally seen the patient for hyponatremia which has largely improved with serum sodium up to 132. Unclear as to how long she was down with PEA. She appears to have a very poor prognosis at this point in time. Continue supportive care as per pulmonary critical care. May need to define goals of care in this situation given overall poor prognosis. Patient is at high risk to develop acute renal failure now but at this time would not recommend any form of renal replacement therapy.   LOS: 3 Marrell Dicaprio 6/9/201810:28 AM

## 2016-07-18 NOTE — Progress Notes (Signed)
[id number] Patient received from 1C via bed.Unresponsive .Tempature 95.5 rectally. Elink MD called-nom one available. Dr. Sung AmabileSimonds paged.No B/P, no pulse ox re ading available.

## 2016-07-18 NOTE — Progress Notes (Signed)
0900 CDS called for GCS less than 3. [phone] given as reference number. CDS recalled. Checked on patient's history. Patient is not a candidate for donor.

## 2016-07-18 NOTE — Progress Notes (Signed)
ANTICOAGULATION CONSULT NOTE - Initial Consult  Pharmacy Consult for heparin Indication: pulmonary embolus (suspectded)  No Known Allergies  Patient Measurements: Height: 5\' 1"  (154.9 cm) Weight: 205 lb 11 oz (93.3 kg) IBW/kg (Calculated) : 47.8 Heparin Dosing Weight: 69.8 kg  Vital Signs: Temp: 93.7 F (34.3 C) (06/09 0900) Temp Source: Core (Comment) (06/09 0722) BP: 75/31 (06/09 0900) Pulse Rate: 74 (06/09 0900)  Labs:  Recent Labs  Oct 02, 2016 1505  07/16/16 0153 07/16/16 0822 07/16/16 1457  HGB 15.1  --  14.9  --   --   HCT 43.8  --  43.4  --   --   PLT 218  --  193  --   --   CREATININE 1.07*  < > 1.10* 0.92 0.81  TROPONINI <0.03  --   --   --   --   < > = values in this interval not displayed.  Estimated Creatinine Clearance: 74.1 mL/min (by C-G formula based on SCr of 0.81 mg/dL).   Medical History: Past Medical History:  Diagnosis Date  . HIV (human immunodeficiency virus infection) (HCC)   . Hypertension       Assessment: 66 yo female starting on heparin for suspected PE s/p code blue. Pt received SQ heparin at 0607 this AM so will not bolus.   Goal of Therapy:  Heparin level 0.3-0.7 units/ml Monitor platelets by anticoagulation protocol: Yes   Plan:  STAT aPTT and INR and CBC No heparin bolus Start heparin drip at 1100 units/hr (=11 ml/hr) Heparin level in 6h CBC in AM  Pharmacy will continue to follow.   Crist FatWang, Fanta Wimberley L 07/18/2016,9:53 AM

## 2016-07-18 NOTE — Progress Notes (Signed)
Code blue called Pt unresponsive. CPR started and epinephrine iv given. Pulse revived. Patient with decreased responsiveness. Received ativan this am. Electively intubated and put on ventilator Patient transferred to ICU Intensivist consulted.

## 2016-07-18 NOTE — Procedures (Signed)
PROCEDURE NOTE: CVL PLACEMENT  INDICATION:    Monitoring of central venous pressures and/or administration of medications optimally administered in central vein  CONSENT:   Risks of procedure as well as the alternatives were explained to the patient or surrogate. Consent for procedure obtained. A time out was performed.   PROCEDURE  Sterile technique was used including antiseptics, cap, gloves, gown, hand hygiene, mask and full body sheet.  Skin prep: Chlorhexidine; local anesthetic administered  A triple lumen catheter was placed in the R [place] vein using the Seldinger technique.   EVALUATION:  Blood flow good  Complications: No apparent complications  Patient tolerated the procedure well.  Chest X-ray revealed proper position with no pneumothorax   Billy Fischeravid Michalina Calbert, MD PCCM service Mobile [phone] 07/18/2016 12:10 PM

## 2016-07-18 NOTE — Progress Notes (Signed)
PULMONARY / CRITICAL CARE MEDICINE   Name: [name] MRN: [id number] DOB: Oct 11, 1950    ADMISSION DATE:  07/22/2016  PT PROFILE: [age] F with polysubstance abuse and HIV admitted 06/06 to the hospitalist service after [facility] with shortness of breath, suspected pneumonia, abdominal pain. She was treated with antibiotics (TMP-SMX) and prednisone for presumed pneumocystis pneumonia. She was supported with nasal cannula oxygen. On 06/08, pulmonary medicine consultation was performed and no major changes were made in her plan. At that time she was on room air and in no respiratory distress but was noted to be agitated and the concern was raised for possible alcohol withdrawal. This was addressed by the primary team. On the morning of 06/09, after shift change, she was found unresponsive with PEA. She underwent ACLS/CPR during which she was intubated and transported to the intensive care unit. At the time of CCM evaluation, she was fully unresponsive, hypoxemic despite ventilatory support, hypotensive despite vasopressors.  MAJOR EVENTS/TEST RESULTS: 05/26-06/04 Hospitalized with discharge diagnosis of acute respiratory failure, COPD, hospital-acquired delirium, HIV, elevated LFTs, hypertension, suspected pneumonia 06/06 Readmitted with increased shortness of breath, suspected pneumonia. 06/08 Initial pulmonary consultation. At that time, patient was on room air in no respiratory distress. No changes made to her plan 06/09 PEA cardiac arrest. Intubated. Transferred to ICU. Refractory shock and hypoxemia. Pupils dilated and unresponsive. Patient exhibiting no spontaneous movement. Occasional ventilatory efforts over set ventilatory rate. 06/09 Echocardiogram: LVEF 65-70%. Moderate LVH, mid cavity obliteration during systole, no evidence of RV overload, estimated RV systolic pressure 36 mmHg  INDWELLING DEVICES:: ETT 06/09 >>  R [place] CVL 06/09 >>   MICRO DATA: MRSA PCR 06/09 >>  negative Resp 06/09 >>  Blood 06/06 >> 1/2 coag negative staph  ANTIMICROBIALS:  Vanc 06/06 >> 06/08 Cefepime 06/06 >> 06/08 TMP-SMX 06/06 >>    SUBJECTIVE:  Comatose. No response to pain. No spontaneous movement.  VITAL SIGNS: BP (!) 38/18   Pulse (!) 189   Temp (!) 90.7 F (32.6 C)   Resp (!) 30   Ht 5\' 1"  (1.549 m)   Wt 205 lb 11 oz (93.3 kg)   SpO2 (!) 82%   BMI 38.86 kg/m   HEMODYNAMICS:    VENTILATOR SETTINGS: Vent Mode: PRVC FiO2 (%):  [100 %] 100 % Set Rate:  [20 bmp] 20 bmp Vt Set:  [500 mL] 500 mL PEEP:  [5 cmH20] 5 cmH20  INTAKE / OUTPUT: I/O last 3 completed shifts: In: 813 [IV Piggyback:813] Out: -   PHYSICAL EXAMINATION: General: Intubated, comatose despite no sedatives, no spontaneous movement Neuro: Pupils mildly dilated and do not react, corneal reflexes absent, doll's eyes absent, no withdrawal from pain HEENT: NCAT, sclerae white Cardiovascular: Very distant heart sounds, no murmurs noted Lungs: Clear anteriorly, no wheezes Abdomen: Obese, soft, diminished to absent bowel sounds, no palpable masses Extremities: Cool, diminished pedal pulses  LABS:  BMET  Recent Labs Lab 07/16/16 0153 07/16/16 0822 07/16/16 1457  07/17/16 1936 07/18/16 0124 07/18/16 0740  NA 121* 122* 123*  124*  < > 127* 128* 132*  K 3.3* 3.4* 3.8  --   --   --   --   CL 86* 91* 88*  --   --   --   --   CO2 25 22 25   --   --   --   --   BUN 23* 22* 19  --   --   --   --   CREATININE  1.10* 0.92 0.81  --   --   --   --   GLUCOSE 144* 131* 209*  --   --   --   --   < > = values in this interval not displayed.  Electrolytes  Recent Labs Lab 07/16/16 0153 07/16/16 0822 07/16/16 1457  CALCIUM 8.4* 8.1* 8.5*  MG  --  2.2  --     CBC  Recent Labs Lab 07/27/16 1505 07/16/16 0153 07/18/16 1130  WBC 9.1 7.5 17.8*  HGB 15.1 14.9 6.9*  HCT 43.8 43.4 22.0*  PLT 218 193 150    Coag's  Recent Labs Lab 07/18/16 0922  APTT >160*  INR 2.76     Sepsis Markers No results for input(s): LATICACIDVEN, PROCALCITON, O2SATVEN in the last 168 hours.  ABG No results for input(s): PHART, PCO2ART, PO2ART in the last 168 hours.  Liver Enzymes  Recent Labs Lab 07-27-2016 1505  AST 96*  ALT 100*  ALKPHOS 70  BILITOT 4.0*  ALBUMIN 3.3*    Cardiac Enzymes  Recent Labs Lab 07-27-16 1505  TROPONINI <0.03    Glucose  Recent Labs Lab 07/13/16 0752 07/13/16 1159 07/16/16 0548 07/16/16 0738 07/18/16 0640 07/18/16 1129  GLUCAP 171* 184* 119* 128* 125* 144*    Imaging - I have personally reviewed all chest imaging Dg Abd 1 View  Result Date: 07/18/2016 CLINICAL DATA:  NG tube placement today. EXAM: ABDOMEN - 1 VIEW COMPARISON:  Single-view of the abdomen earlier today. FINDINGS: NG tube is in place with the tip in good position in the distal stomach. Gaseous distention of small bowel persists without change. Gallstones noted. IMPRESSION: NG tube in good position.  No other change. Electronically Signed   By: Drusilla Kanner M.D.   On: 07/18/2016 09:30   Dg Abd 1 View  Result Date: 07/18/2016 CLINICAL DATA:  Report of orogastric tube placement EXAM: ABDOMEN - 1 VIEW COMPARISON:  CT abdomen and pelvis Jul 27, 2016 FINDINGS: No orogastric tube evident. There are gallstones in the right upper quadrant. There is no bowel dilatation or air-fluid level to suggest bowel obstruction. No free air. IMPRESSION: Bowel gas pattern unremarkable without evidence of bowel obstruction or free air. Cholelithiasis present. No orogastric tube evident. Electronically Signed   By: Bretta Bang III M.D.   On: 07/18/2016 07:45   Dg Chest Port 1 View  Result Date: 07/18/2016 CLINICAL DATA:  Endotracheal tube adjustment. Central venous catheter placement. EXAM: PORTABLE CHEST 1 VIEW COMPARISON:  Chest radiograph from earlier today. FINDINGS: Endotracheal tube tip is 3.0 cm above the carina. Enteric tube terminates in the distal stomach. Right  subclavian central venous catheter terminates over the right atrium. Stable cardiomediastinal silhouette with top-normal heart size. No pneumothorax. No pleural effusion. Curvilinear opacities and volume loss in the right upper lung and at the lung bases, unchanged. IMPRESSION: 1. Endotracheal tube tip 3.0 cm above the carina. 2. Enteric tube terminates in the distal stomach. 3. Right subclavian central venous catheter terminates over the right atrium. No pneumothorax. 4. Stable curvilinear lung opacities in the upper right lung and lung bases, favor scarring or atelectasis. Electronically Signed   By: Delbert Phenix M.D.   On: 07/18/2016 09:35   Dg Chest Port 1 View  Result Date: 07/18/2016 CLINICAL DATA:  Hypoxia. Reported placement of orogastric tube and endotracheal tube EXAM: PORTABLE CHEST 1 VIEW COMPARISON:  07/27/2016 FINDINGS: Endotracheal tube tip is 1.4 cm above the carina. Orogastric tube not seen. No pneumothorax.  There is atelectatic change in the right upper lobe and left base regions. No edema or consolidation. Heart size and pulmonary vascularity are normal. No adenopathy. No bone lesions. IMPRESSION: Endotracheal tube tip is 1.4 cm above the carina. It may be prudent to consider withdrawing endotracheal tube approximately 2 cm. No orogastric tube evident. Areas of atelectatic change in the right upper lobe and left base regions. Lungs elsewhere clear. Cardiac silhouette within normal limits. These results will be called to the ordering clinician or representative by the Radiologist Assistant, and communication documented in the PACS or zVision Dashboard. Electronically Signed   By: Bretta Bang III M.D.   On: 07/18/2016 07:46    ASSESSMENT / PLAN:  PULMONARY A: Ventilator dependent respiratory failure after cardiac arrest Acute hypoxemic respiratory failure Minimal right upper lobe opacity P:   Vent settings established Vent bundle implemented Daily SBT as indicated    CARDIOVASCULAR A:  PEA cardiac arrest - unclear etiology New RBBB Refractory shock post cardiac arrest - unclear etiology. Initially considered possible massive pulmonary embolism. However, echocardiogram suggests hypovolemia P:  MAP goal >65 mmHg Vasopressors as ordered (have advanced to epinephrine infusion) Volume resuscitate  RENAL A:   AKI, anuric P:   Monitor BMET intermittently Monitor I/Os Correct electrolytes as indicated  Not presently a candidate for renal replacement therapy  GASTROINTESTINAL A:   No issues P:   SUP: IV pantoprazole Consider TF's 06/10 if hemodynamically stable  HEMATOLOGIC A:   Apparent acute blood loss anemia P:  Recheck H/H If anemia confirmed, 2 units PRBCs ordered DVT px: SCDs Monitor CBC intermittently Transfuse per usual guidelines  INFECTIOUS A:   HIV positive Recent pneumonia No definite acute infectious process P:   Monitor temp, WBC count Micro and abx as above   ENDOCRINE A:   Mild hyperglycemia without prior history diabetes Recent prednisone therapy P:   Stress dose steroids ordered SSI protocol while on steroids  NEUROLOGIC A:   Anoxic encephalopathy post cardiac arrest P:   RASS goal: -1, -2 Minimize sedation for now.   FAMILY  - Updates: I have updated the patient's "caregiver", sister and am now asked to update her daughter.  CCM time: 60 mins The above time includes time spent in consultation with patient and/or family members and reviewing care plan on multidisciplinary rounds  Billy Fischer, MD PCCM service Mobile [phone] Pager [phone]     07/18/2016, 1:26 PM

## 2016-07-18 NOTE — ED Provider Notes (Signed)
[facility]  Department of Emergency Medicine   Code Blue CONSULT NOTE  Chief Complaint: Cardiac arrest/unresponsive   Level V Caveat: Unresponsive  History of present illness: I was contacted by the hospital for a CODE BLUE cardiac arrest upstairs and presented to the patient's bedside.   Quick report from floor nurse visit the patient did not have a witness, but they suspect she had a sudden arrest. CPR was started immediately along with bag-valve-mask ventilation. Her blood sugar was normal.  The patient did receive a dose of Ativan during the evening.  ROS: Unable to obtain, Level V caveat  Scheduled Meds: . aspirin EC  81 mg Oral Daily  . bictegravir-emtricitabine-tenofovir AF  1 tablet Oral Daily  . darunavir-cobicistat  1 tablet Oral Q breakfast  . docusate  100 mg Oral BID  . folic acid  1 mg Oral Daily  . furosemide  40 mg Oral Daily  . heparin  5,000 Units Subcutaneous Q8H  . ipratropium-albuterol  3 mL Nebulization Q6H  . mouth rinse  15 mL Mouth Rinse BID  . multivitamin with minerals  1 tablet Oral Daily  . predniSONE  40 mg Oral Q breakfast   Followed by  . [START ON 07/23/2016] predniSONE  20 mg Oral Q breakfast  . ramipril  10 mg Oral QPM  . sodium chloride  1 g Oral BID WC  . thiamine  100 mg Oral Daily   Or  . thiamine  100 mg Intravenous Daily   Continuous Infusions: . DOPamine    . DOPamine    . fluconazole (DIFLUCAN) IV Stopped (07/17/16 1145)  . norepinephrine (LEVOPHED) Adult infusion    . propofol (DIPRIVAN) infusion    . sulfamethoxazole-trimethoprim Stopped (07/18/16 0221)   PRN Meds:.acetaminophen **OR** acetaminophen, bisacodyl, LORazepam **OR** LORazepam, ondansetron **OR** ondansetron (ZOFRAN) IV Past Medical History:  Diagnosis Date  . HIV (human immunodeficiency virus infection) (HCC)   . Hypertension    History reviewed. No pertinent surgical history. Social History   Social History  . Marital status: Single   Spouse name: N/A  . Number of children: N/A  . Years of education: N/A   Occupational History  . Not on file.   Social History Main Topics  . Smoking status: Never Smoker  . Smokeless tobacco: Never Used  . Alcohol use Yes  . Drug use: No  . Sexual activity: Not on file   Other Topics Concern  . Not on file   Social History Narrative  . No narrative on file   No Known Allergies  Last set of Vital Signs (not current) Vitals:   07/18/16 0544 07/18/16 0722  BP: 133/84 (!) 62/52  Pulse: (!) 129   Resp:  20  Temp: 97 F (36.1 C) (!) 95.2 F (35.1 C)      Physical Exam  Gen: unresponsive Cardiovascular: pulseless  Resp: apneic. Breath sounds equal bilaterally with bagging  Abd: nondistended  Neuro: GCS 3, unresponsive to pain  HEENT: Dry black coffee ground versus blood in posterior pharynx, gag reflex absent  Neck: No crepitus  Musculoskeletal: No deformity  Skin: Cool peripherally  Procedures  INTUBATION  Patient name bagging easily, after compressions began spontaneously ventilating and was switched to nonrebreather. She was respiratory well, however remained unresponsive and with a pulse. Intubation was indicated due to unresponsiveness and concerns the patient is no longer protecting airway.  An initial, brief attempt at intubation was performed utilizing a direct laryngoscopy technique, but due to what  appeared to be coffee grounds the posterior pharynx with suctioning was not clear and the vocal cord structures were also not clear. The glidessope then called for from the emergency department and the patient continued on nonrebreather.  Performed by: Sharyn Creamer Required items: required blood products, implants, devices, and special equipment available Patient identity confirmed: provided demographic data and hospital-assigned identification number Time out: Immediately prior to procedure a "time out" was called to verify the correct patient, procedure,  equipment, support staff and site/side marked as required. Indications: Unresponsiveness  Intubation method: video laryngoscopy Preoxygenation: Nonrebreather and BVM Sedatives: None  Paralytic: None  Tube Size: 7.5 cuffed Post-procedure assessment: chest rise and ETCO2 monitor Breath sounds: equal and absent over the epigastrium Tube secured by Respiratory Therapy Patient tolerated the procedure well with no immediate complications.  CRITICAL CARE Performed by: Sharyn Creamer Total critical care time: 37 Critical care time was exclusive of separately billable procedures and treating other patients. Critical care was necessary to treat or prevent imminent or life-threatening deterioration. Critical care was time spent personally by me on the following activities: development of treatment plan with patient and/or surrogate as well as nursing, discussions with consultants, evaluation of patient's response to treatment, examination of patient, obtaining history from patient or surrogate, ordering and performing treatments and interventions, ordering and review of laboratory studies, ordering and review of radiographic studies, pulse oximetry and re-evaluation of patient's condition.  Cardiopulmonary Resuscitation (CPR) Procedure Note  Directed/Performed by: Sharyn Creamer I personally directed ancillary staff and/or performed CPR for about 4 minutes in an effort to regain return of spontaneous circulation and to maintain cardiac, neuro and systemic perfusion.    Medical Decision making  Patient in arrest. Initially PEA, then after about 4 minutes of CPR and administration of epinephrine patient had return of pulses and began with spontaneous respirations. However, the patient's mental status remained unresponsive to verbal or painful stimuli. She would blink her eyes, and breathe spontaneously. The pupils were reactive and midpoint.    Assessment and Plan  Cardiac arrest. Suspect due to  respiratory etiology, though an obvious and large broad workup needs to be initiated under the care of the hospitalist service. Airway secured, CPR successful.  Care turned over at the bedside to Dr. Tobi Bastos who escorted patient to ICU.    Sharyn Creamer, MD 07/18/16 [phone]

## 2016-07-19 LAB — TYPE AND SCREEN
ABO/RH(D): A POS
ANTIBODY SCREEN: NEGATIVE
Unit division: 0
Unit division: 0

## 2016-07-19 LAB — BPAM RBC
BLOOD PRODUCT EXPIRATION DATE: 201806132359
BLOOD PRODUCT EXPIRATION DATE: 201806132359
ISSUE DATE / TIME: 201806092025
ISSUE DATE / TIME: 201806091549
UNIT TYPE AND RH: 9500
UNIT TYPE AND RH: 9500

## 2016-07-19 LAB — GLUCOSE, CAPILLARY
Glucose-Capillary: 493 mg/dL — ABNORMAL HIGH (ref 65–99)
Glucose-Capillary: 496 mg/dL — ABNORMAL HIGH (ref 65–99)

## 2016-07-19 MED ORDER — INSULIN ASPART 100 UNIT/ML ~~LOC~~ SOLN
0.0000 [IU] | SUBCUTANEOUS | Status: DC
  Administered 2016-07-19: 20 [IU] via SUBCUTANEOUS
  Filled 2016-07-19: qty 20

## 2016-07-20 LAB — CULTURE, BLOOD (ROUTINE X 2): Culture: NO GROWTH

## 2016-07-20 LAB — CULTURE, RESPIRATORY W GRAM STAIN: Culture: NORMAL

## 2016-07-20 LAB — GLUCOSE, CAPILLARY: Glucose-Capillary: 85 mg/dL (ref 65–99)

## 2016-07-21 LAB — VITAMIN B1: VITAMIN B1 (THIAMINE): 75.7 nmol/L (ref 66.5–200.0)

## 2016-07-31 ENCOUNTER — Telehealth: Payer: Self-pay | Admitting: Pulmonary Disease

## 2016-07-31 NOTE — Telephone Encounter (Signed)
Death certificate placed in provider DK folder for completion.[place]

## 2016-07-31 NOTE — Telephone Encounter (Signed)
Death certificated completed and [facility].

## 2016-07-31 NOTE — Telephone Encounter (Signed)
Received death Certificate from [employer].  Please call asap cremation scheduled for 6/23

## 2016-08-09 NOTE — Progress Notes (Signed)
CH received PG from ICU that PT had passed and family needed support. Upon arrival, security had been called to control extremely upset family members. One family member had fainted and was taken to ED. [facility]. Family is waiting for more family to arrive.    06/26/2016 0300  Clinical Encounter Type  Visited With Patient and family together  Visit Type Death  Referral From Nurse  Consult/Referral To Chaplain  Spiritual Encounters  Spiritual Needs Grief support

## 2016-08-09 NOTE — Progress Notes (Signed)
Time of death: 0306.  Cheryll CockayneBritton Lee Rust-Chester, RN & Nicholes Mangokeisha Baldwin, RN pronounced death. Dr. Darrick Pennaeterding was notified, [facility] nursing supervisor was called.

## 2016-08-09 NOTE — Progress Notes (Signed)
Pt output from NG tube is dark red amount (1900-2230) O2 sats not readable B/p is running low 50's/20's MD. Smith/ elink notified and will cont to monitor.

## 2016-08-09 NOTE — Discharge Summary (Addendum)
DEATH SUMMARY  DATE OF ADMISSION:  07/30/2016  DATE OF DISCHARGE/DEATH:  May 21, 2016  ADMISSION DIAGNOSES:   Acute respiratory failure with hypoxemia Pneumonia, NOS Acute on chronic hyponatremia Altered mental status Abdominal pain HIV Polysubstance abuse  DISCHARGE DIAGNOSES:   Acute respiratory failure with hypoxemia Pulmonary infiltrates, possible pneumonia Acute on chronic hyponatremia Altered mental status Abdominal pain Polysubstance abuse Ventilator dependent respiratory failure after cardiac arrest Acute hypoxemic respiratory failure Right upper lobe opacity PEA cardiac arrest - unclear etiology New RBBB Refractory shock post cardiac arrest - unclear etiology.  AKI, anuric Apparent acute blood loss anemia without identified source HIV positive Recent pneumonia Mild hyperglycemia without prior history diabetes Recent prednisone therapy Anoxic encephalopathy post cardiac arrest  PRESENTATION:   Pt was admitted by the Hospitalist Servicewith the following HPI and the above admission diagnoses:  [name]  is a 66 y.o. female with a known history of HIV on antiretrovirals, who was discharged from hospital 2 days ago for treating her with pneumonia and possible PCP discharged with Bactrim, steroids comes back today with worsening shortness of breath, cough, abdominal pain. Patient was discharged on fourth of June with Bactrim, prednisone, oral antibiotics. Plan is to continue steroids for total of 21 days and also 21 days Bactrim. Patient now requiring 2 L of oxygen to keep saturation 96% and she also looks confused. The M found to be 121, chest x-ray showed worsening pneumonia.   HOSPITAL COURSE:   05/26-06/04 Hospitalization with discharge diagnosis of acute respiratory failure, COPD, hospital-acquired delirium, HIV, elevated LFTs, hypertension, suspected pneumonia 06/06 Readmitted with increased shortness of breath, suspected pneumonia. 06/06 CTAP: No acute  finding 06/08 Pulmonary consultation: patient was on room air in no respiratory distress. No changes made to her plan 06/09 early AM: PEA cardiac arrest. Intubated. Transferred to ICU. Refractory shock and hypoxemia. Pupils dilated and unresponsive. Patient exhibiting no spontaneous movement. Occasional ventilatory efforts over set ventilatory rate. 06/09 Echocardiogram: LVEF 65-70%. Moderate LVH, mid cavity obliteration during systole, no evidence of RV overload, estimated RV systolic pressure 36 mmHg 06/09 received 2 units PRBCs for Hgb 7.2 06/09 Refractory shock despite maximum vasopressors including epinephrine infusion. Anuria. Remained comatose.  06/10 AM: progressive shock and bradycardia > asystole. Pronounced by RN @ [phone]  Cause of death:  Refractory shock after PEA cardiac arrest  Contributing factors: AKI Anoxic encephalopathy Refractory metabolic acidosis  Autopsy: No  Smoking: yes  Billy Fischeravid Simonds, MD PCCM service Mobile [phone] Pager [phone] 04/14/16 10:42 AM

## 2016-08-09 NOTE — Progress Notes (Signed)
Pt having more freq PVC's and bigeminy, longer and longer pauses (3 to 6.20) Called in family- daughter Meribeth Mattes(Bonita Piatek) Notified MD elink, Nurse can pronounce death if occur

## 2016-08-09 DEATH — deceased

## 2017-07-19 IMAGING — US US ABDOMEN LIMITED
1 series · 14 of 25 positions shown · non-contrast
Comparison: CT abdomen pelvis performed earlier today, 01/24/2013
and earlier. Right upper quadrant abdominal ultrasound 02/01/2013,
01/24/2013 and earlier.

CLINICAL DATA: Hyperbilirubinemia.  Known cholelithiasis.

EXAM:
ULTRASOUND ABDOMEN LIMITED RIGHT UPPER QUADRANT

[Series 1: us abdomen limited · 0.25mm/px · 14 of 60 slices shown]
[im 1/60]
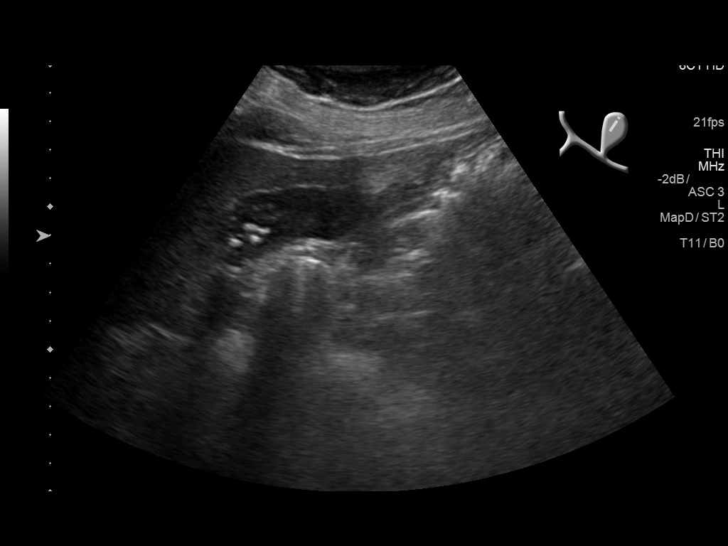
[im 5/60]
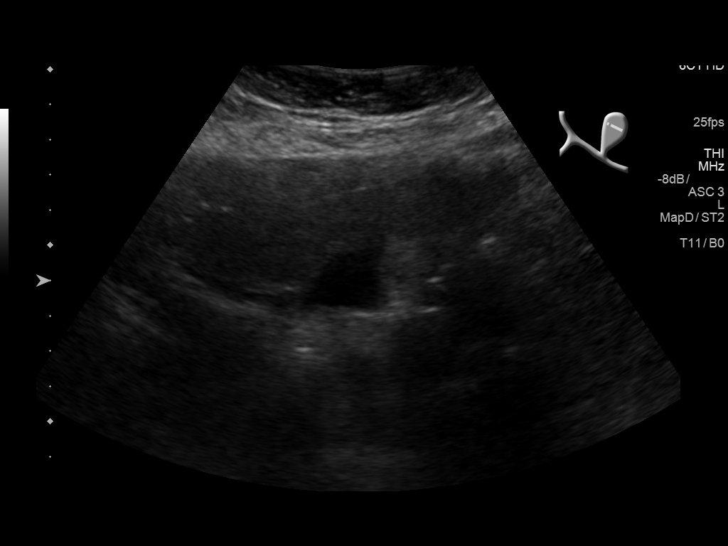
[im 10/60]
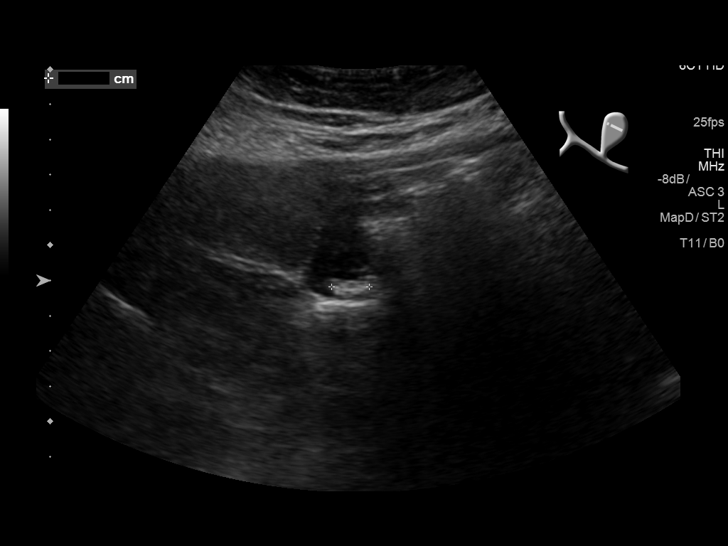
[im 15/60]
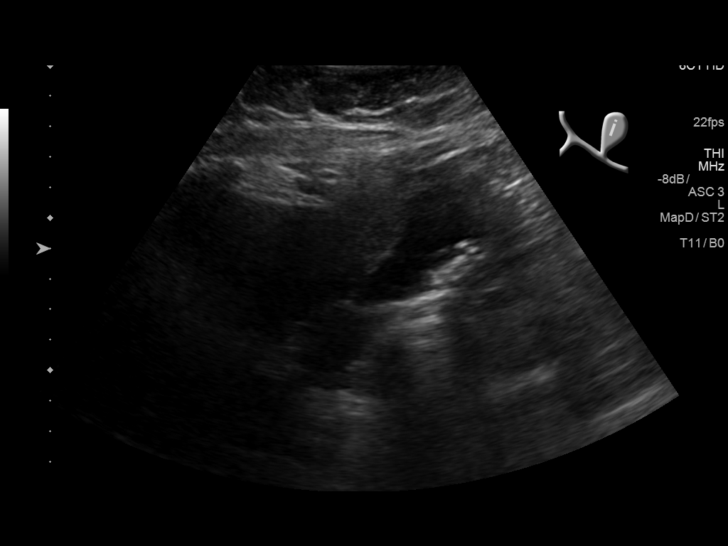
[im 20/60]
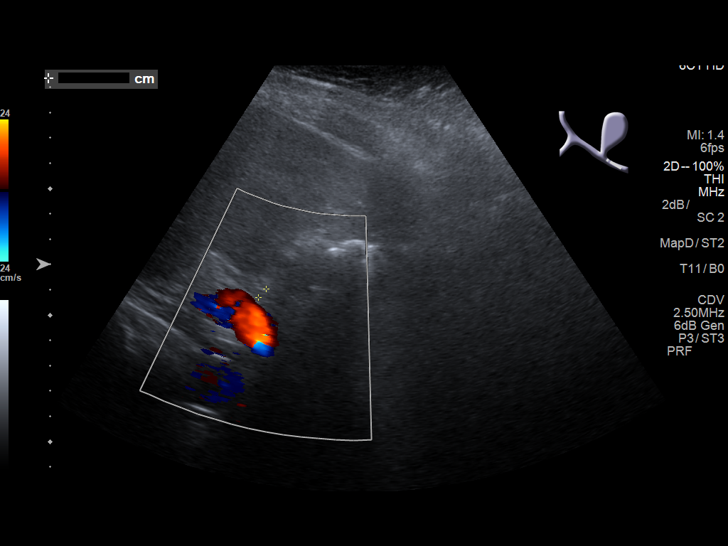
[im 23/60]
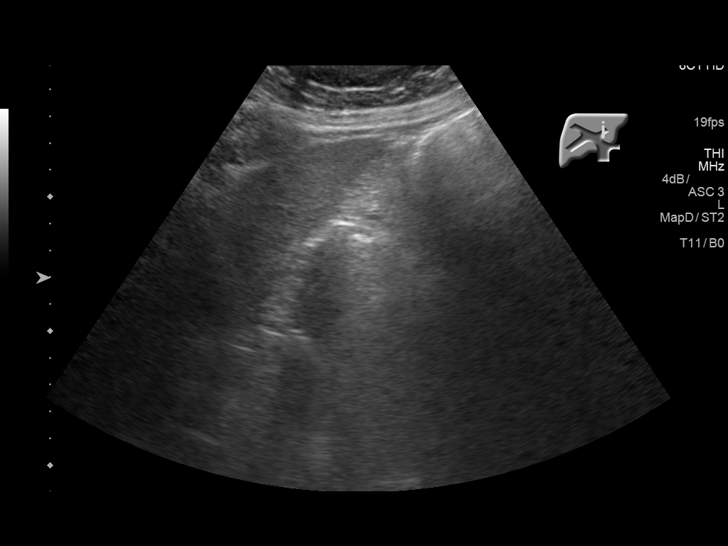
[im 28/60]
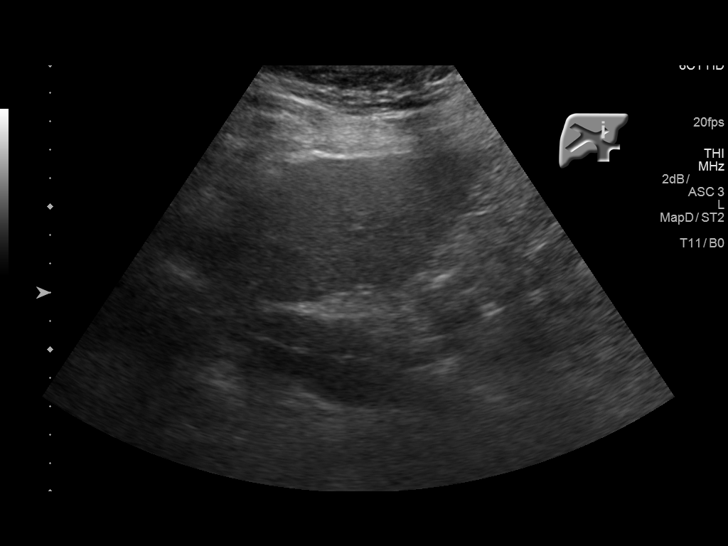
[im 32/60]
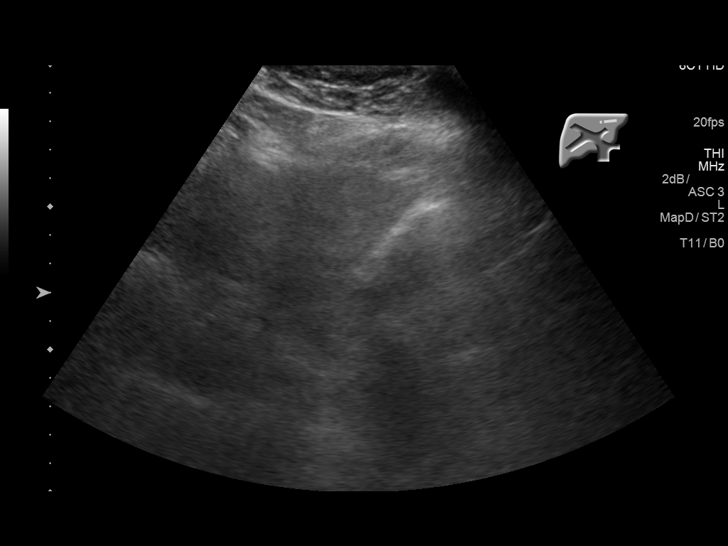
[im 37/60]
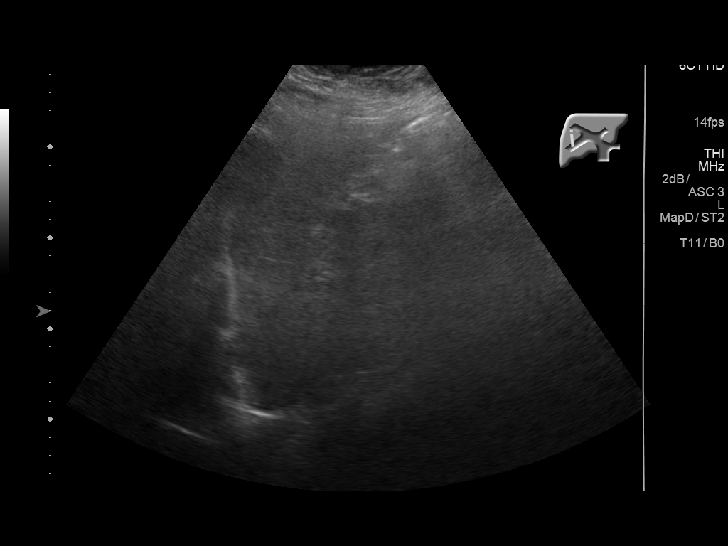
[im 40/60]
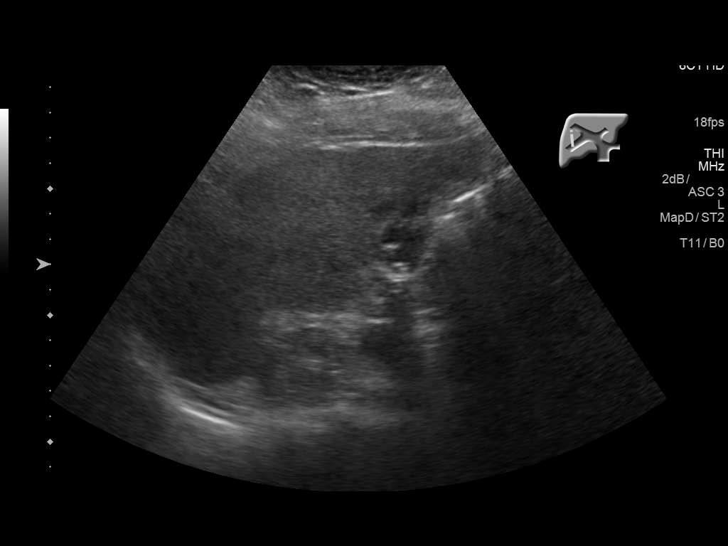
[im 45/60]
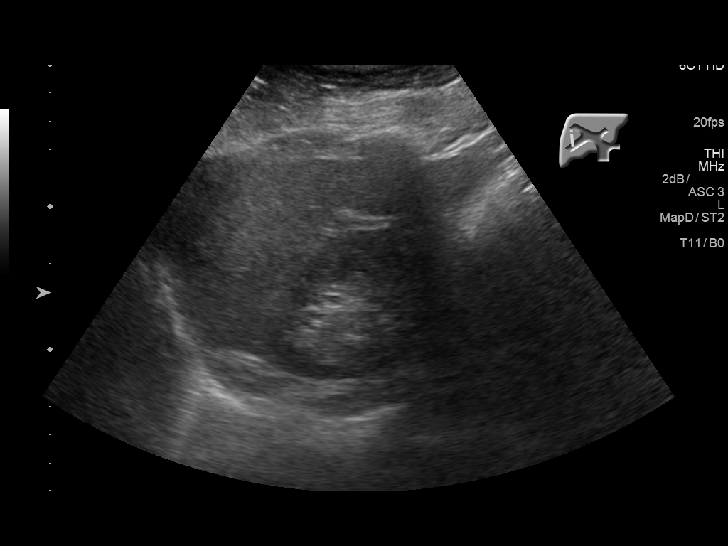
[im 50/60]
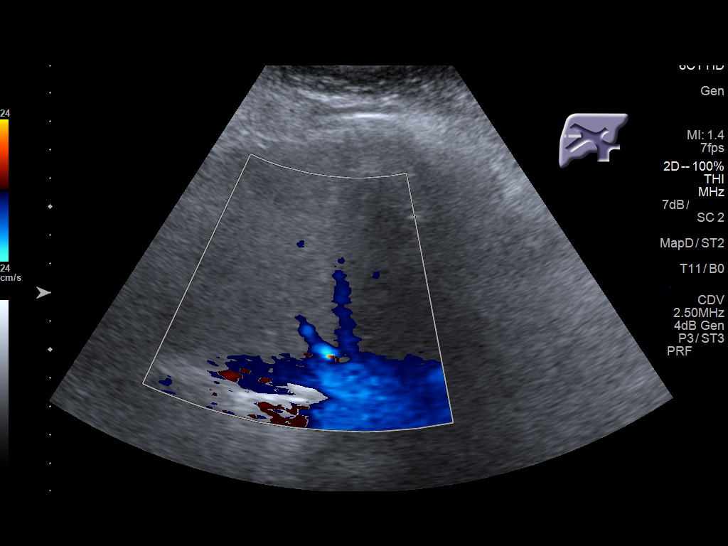
[im 55/60]
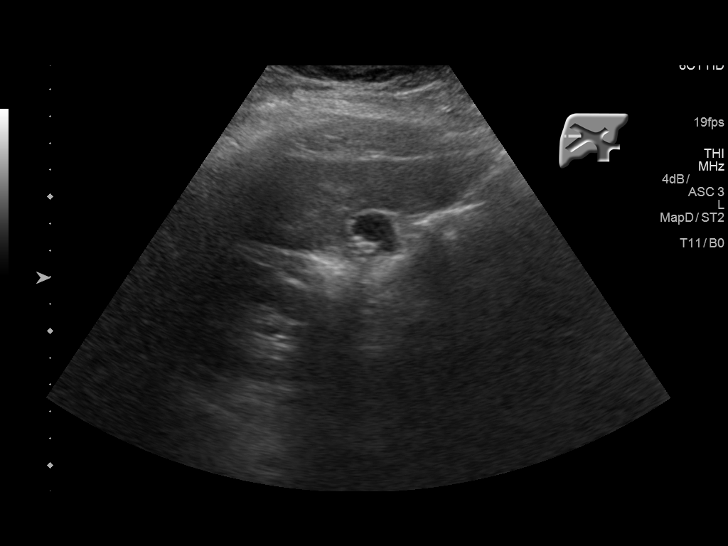
[im 60/60]
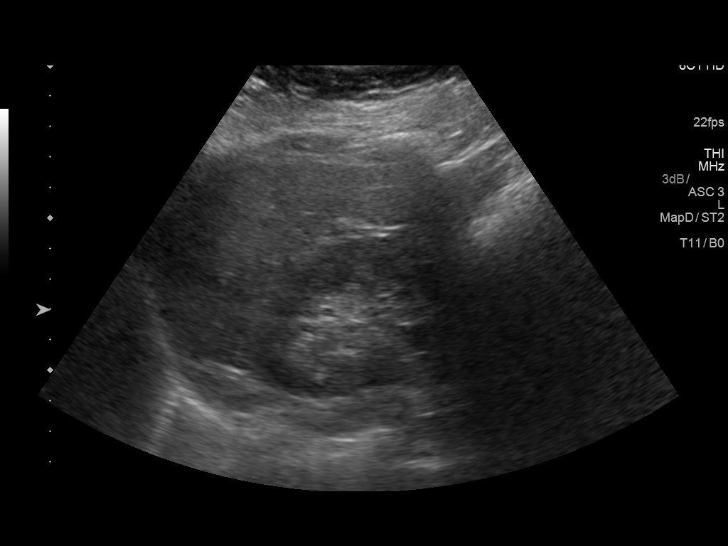

[14 of 25 positions shown; findings below may reference images not displayed]

FINDINGS: Gallbladder:

Multiple echogenic gallstones, the largest on the order of 10 mm. No
gallbladder wall thickening or pericholecystic fluid. Negative
sonographic Murphy's sign according to the ultrasound technologist.

Common bile duct:

Diameter: Approximately 5 mm proximally.

No visible stones in the proximal duct. The mid and distal duct are
obscured by duodenal bowel gas.

Liver:

Scattered geographic areas of increased parenchymal echotexture. No
parenchymal masses. Patent though narrowed main portal vein with
hepatopetal flow (as noted on the earlier CT).
IMPRESSION: 1. Cholelithiasis without sonographic evidence of acute
cholecystitis.
2. No evidence of biliary ductal dilation.
3. Scattered areas of focal hepatic steatosis. No hepatic masses.
Narrowed main portal vein with hepatopetal flow (as noted on the
earlier CT).

## 2018-05-19 IMAGING — CT CT ABD-PELV W/ CM
2 of 5 series · 16 of 46 positions shown, 18 images · IV contrast (APPLIED)
Comparison: 01/24/2013

CLINICAL DATA: Acute right lower quadrant pain.

EXAM:
CT ABDOMEN AND PELVIS WITH CONTRAST
TECHNIQUE: Multidetector CT imaging of the abdomen and pelvis was performed
using the standard protocol following bolus administration of
intravenous contrast.
CONTRAST:  100mL CDJ19T-DOO IOPAMIDOL (CDJ19T-DOO) INJECTION 61%

[Series 2: axial st · axial · 0.82mm/px · z∈[-707,-327]mm · 13 of 86 slices shown, 15 images]
[im 5/86  soft-tissue]
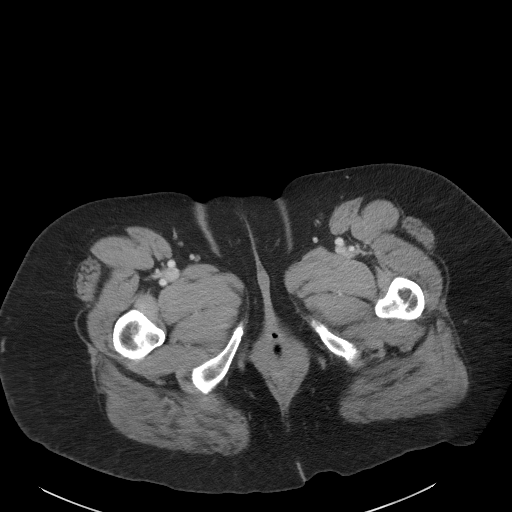
[im 5/86  bone]
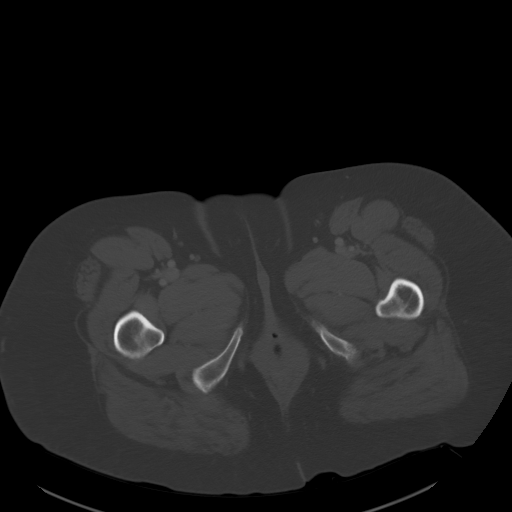
[im 14/86  soft-tissue]
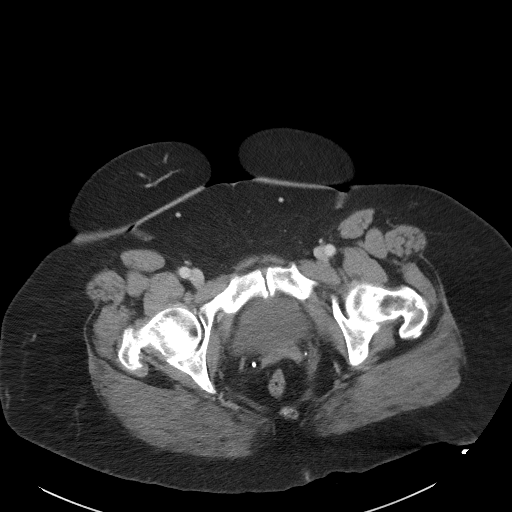
[im 18/86  soft-tissue]
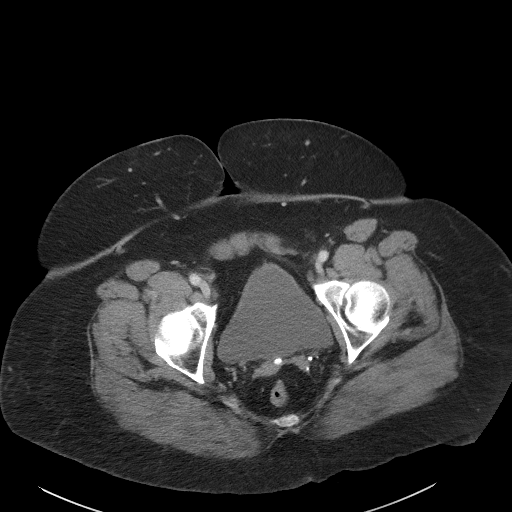
[im 23/86  soft-tissue]
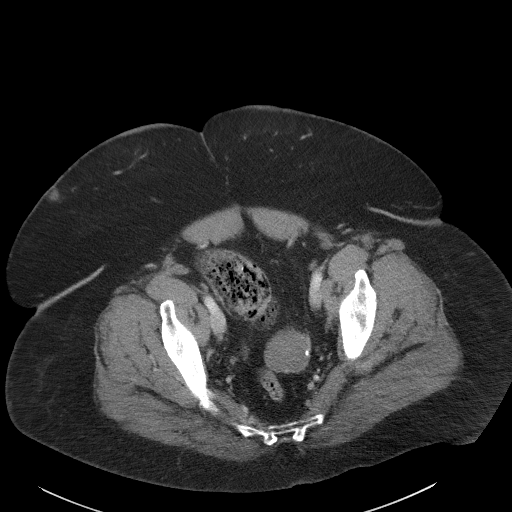
[im 32/86  soft-tissue]
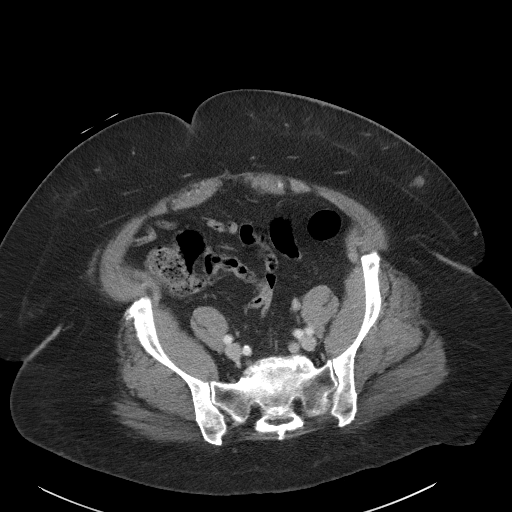
[im 36/86  soft-tissue]
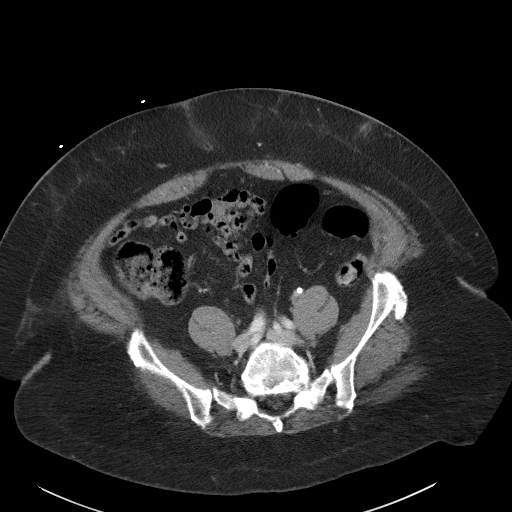
[im 45/86  soft-tissue]
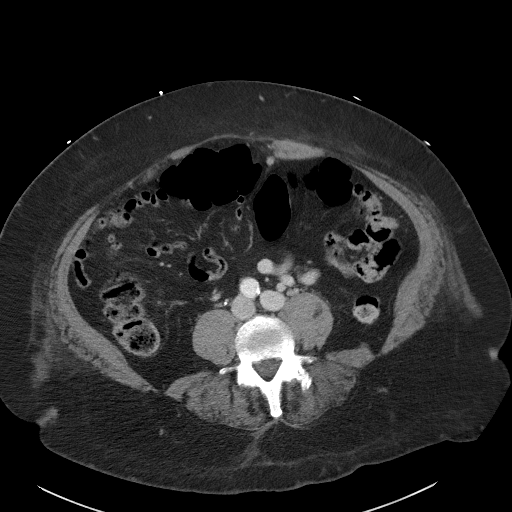
[im 50/86  soft-tissue]
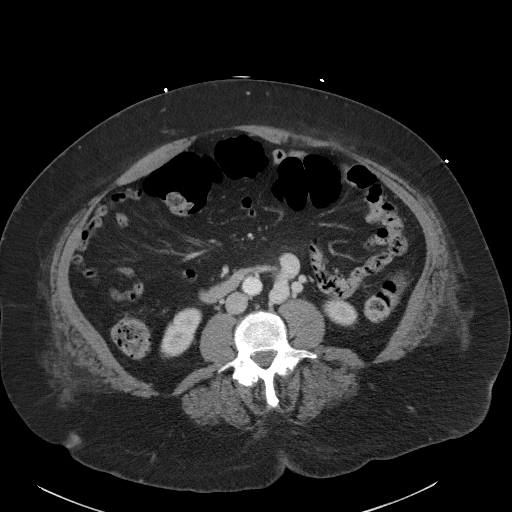
[im 54/86  soft-tissue]
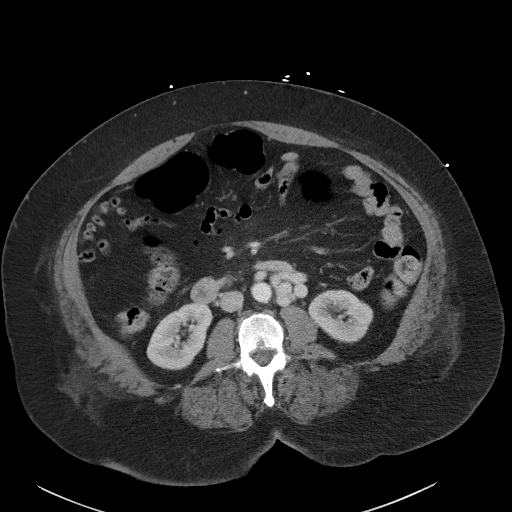
[im 54/86  bone]
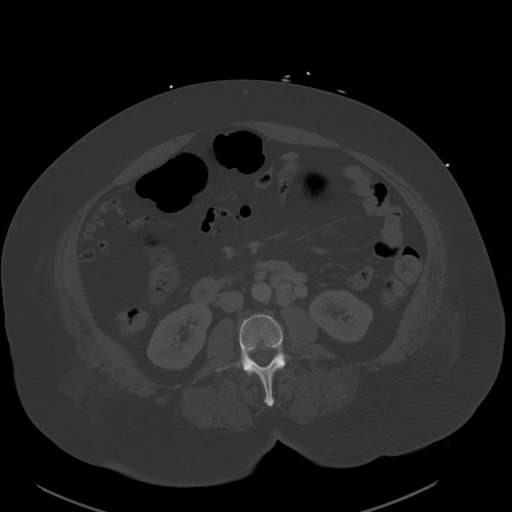
[im 63/86  soft-tissue]
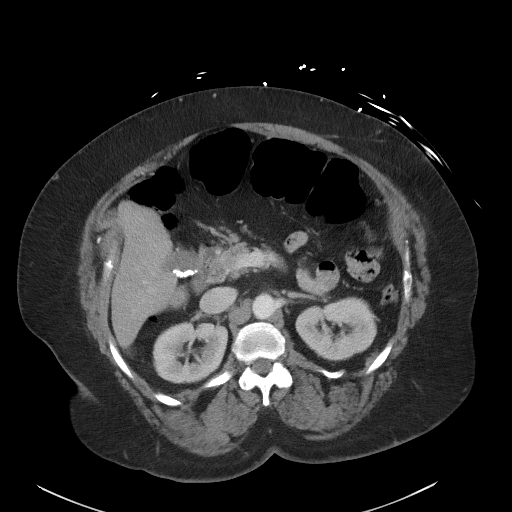
[im 68/86  soft-tissue]
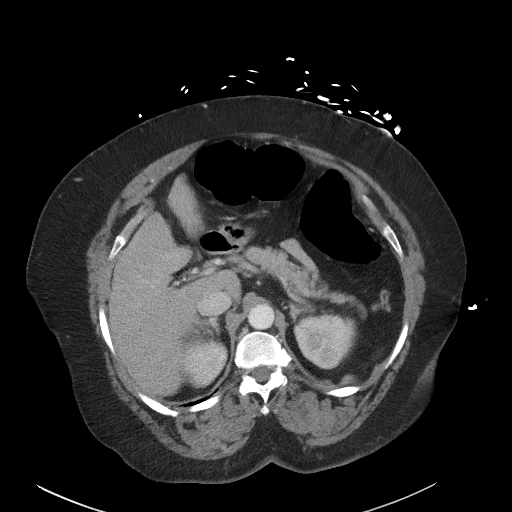
[im 72/86  soft-tissue]
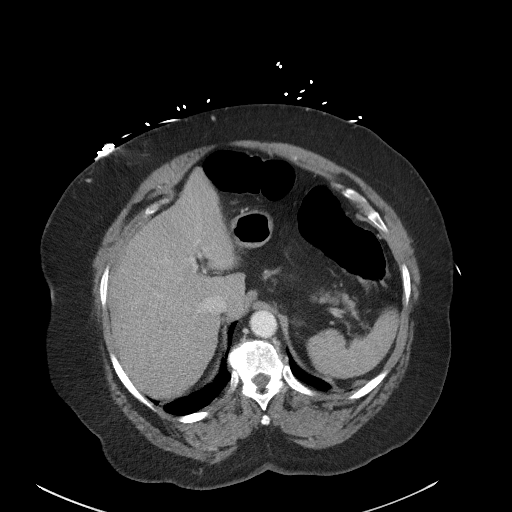
[im 81/86  soft-tissue]
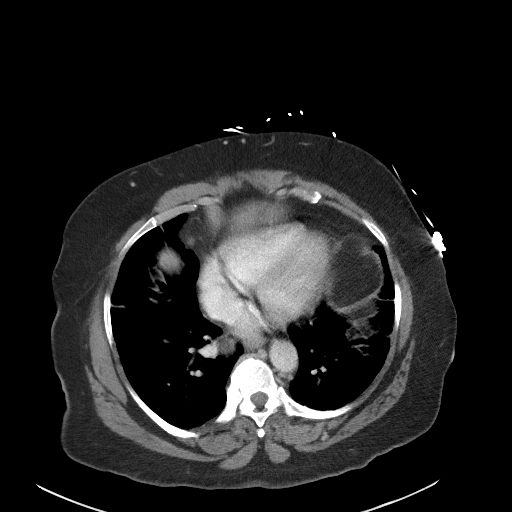

[Series 5: coronal st · coronal · 0.78mm/px · 3 of 104 slices shown]
[im 35/104  soft-tissue]
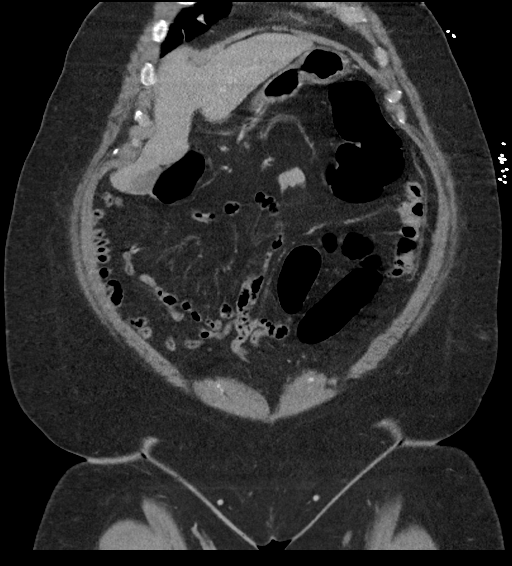
[im 46/104  soft-tissue]
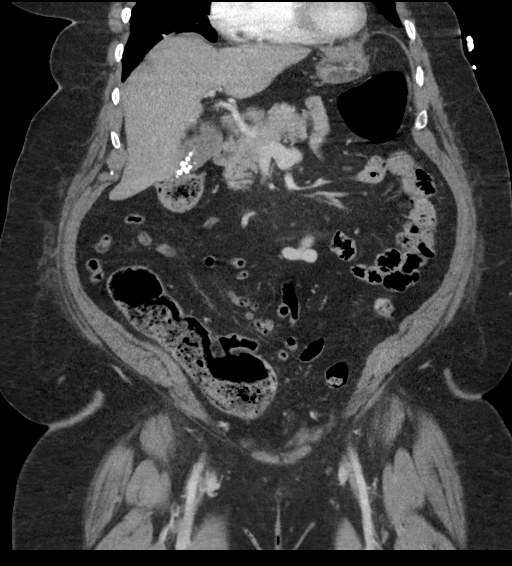
[im 58/104  soft-tissue]
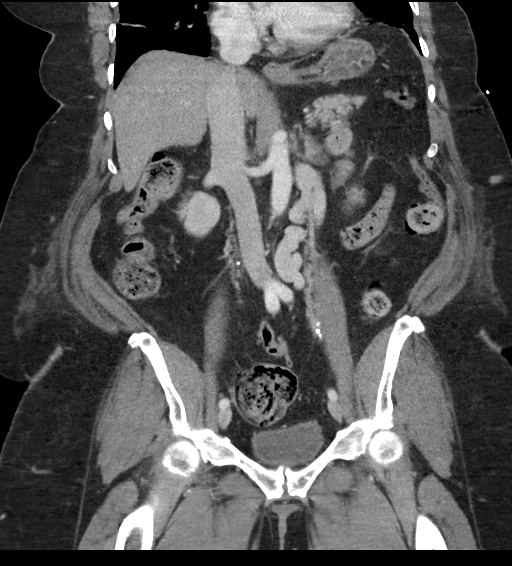

[16 of 46 positions shown; findings below may reference images not displayed]

FINDINGS: Lower chest: Cardiomegaly. Subsegmental atelectasis or scarring at
the bases.

Hepatobiliary: No focal liver abnormality.Cholelithiasis. No
evidence of acute cholecystitis or biliary obstruction

Pancreas: Unremarkable.

Spleen: Unremarkable.

Adrenals/Urinary Tract: Negative adrenals. No hydronephrosis or
stone. 18 mm simple appearing left renal cyst. Unremarkable bladder.

Stomach/Bowel: No obstruction. Moderate colonic gas and stool. No
pericecal or other inflammation.

Vascular/Lymphatic: Narrow appearance of the main portal vein that
is chronic. Chronically seen retroperitoneal varix between the IMV
and left renal vein. No mass or adenopathy.

Reproductive:Hysterectomy and possible right oophorectomy. There is
a left adnexal cyst measuring 4.1 cm. Enhancing tissue along the
left aspect of the wall could be residual ovary. This cyst had
internal complex material in 6559 pelvic sonography. This mass was
also evaluated at [HOSPITAL] per sonographic report June 2009, when
complexity was also noted at that time.

Other: No ascites or pneumoperitoneum.

Musculoskeletal: Spondylosis and facet arthropathy. No acute or
aggressive finding.
IMPRESSION: 1. No acute finding.
2. Cholelithiasis without signs of cholecystitis.
3. Chronic narrow main portal vein with varix between the IMV and
left renal vein.
4. Chronic left adnexal cyst measuring up to 4.1 cm, size stable
from 2334. Recommend yearly pelvic sonography.

## 2018-05-19 IMAGING — CR DG CHEST 2V
2 series · 2 of 2 positions shown · non-contrast
Comparison: Chest x-ray of July 09, 2016

CLINICAL DATA: Recently hospitalized with pneumonia. Patient
returns with shortness of breath. History of hypertension and HIV.

EXAM:
CHEST  2 VIEW

[chest lat]
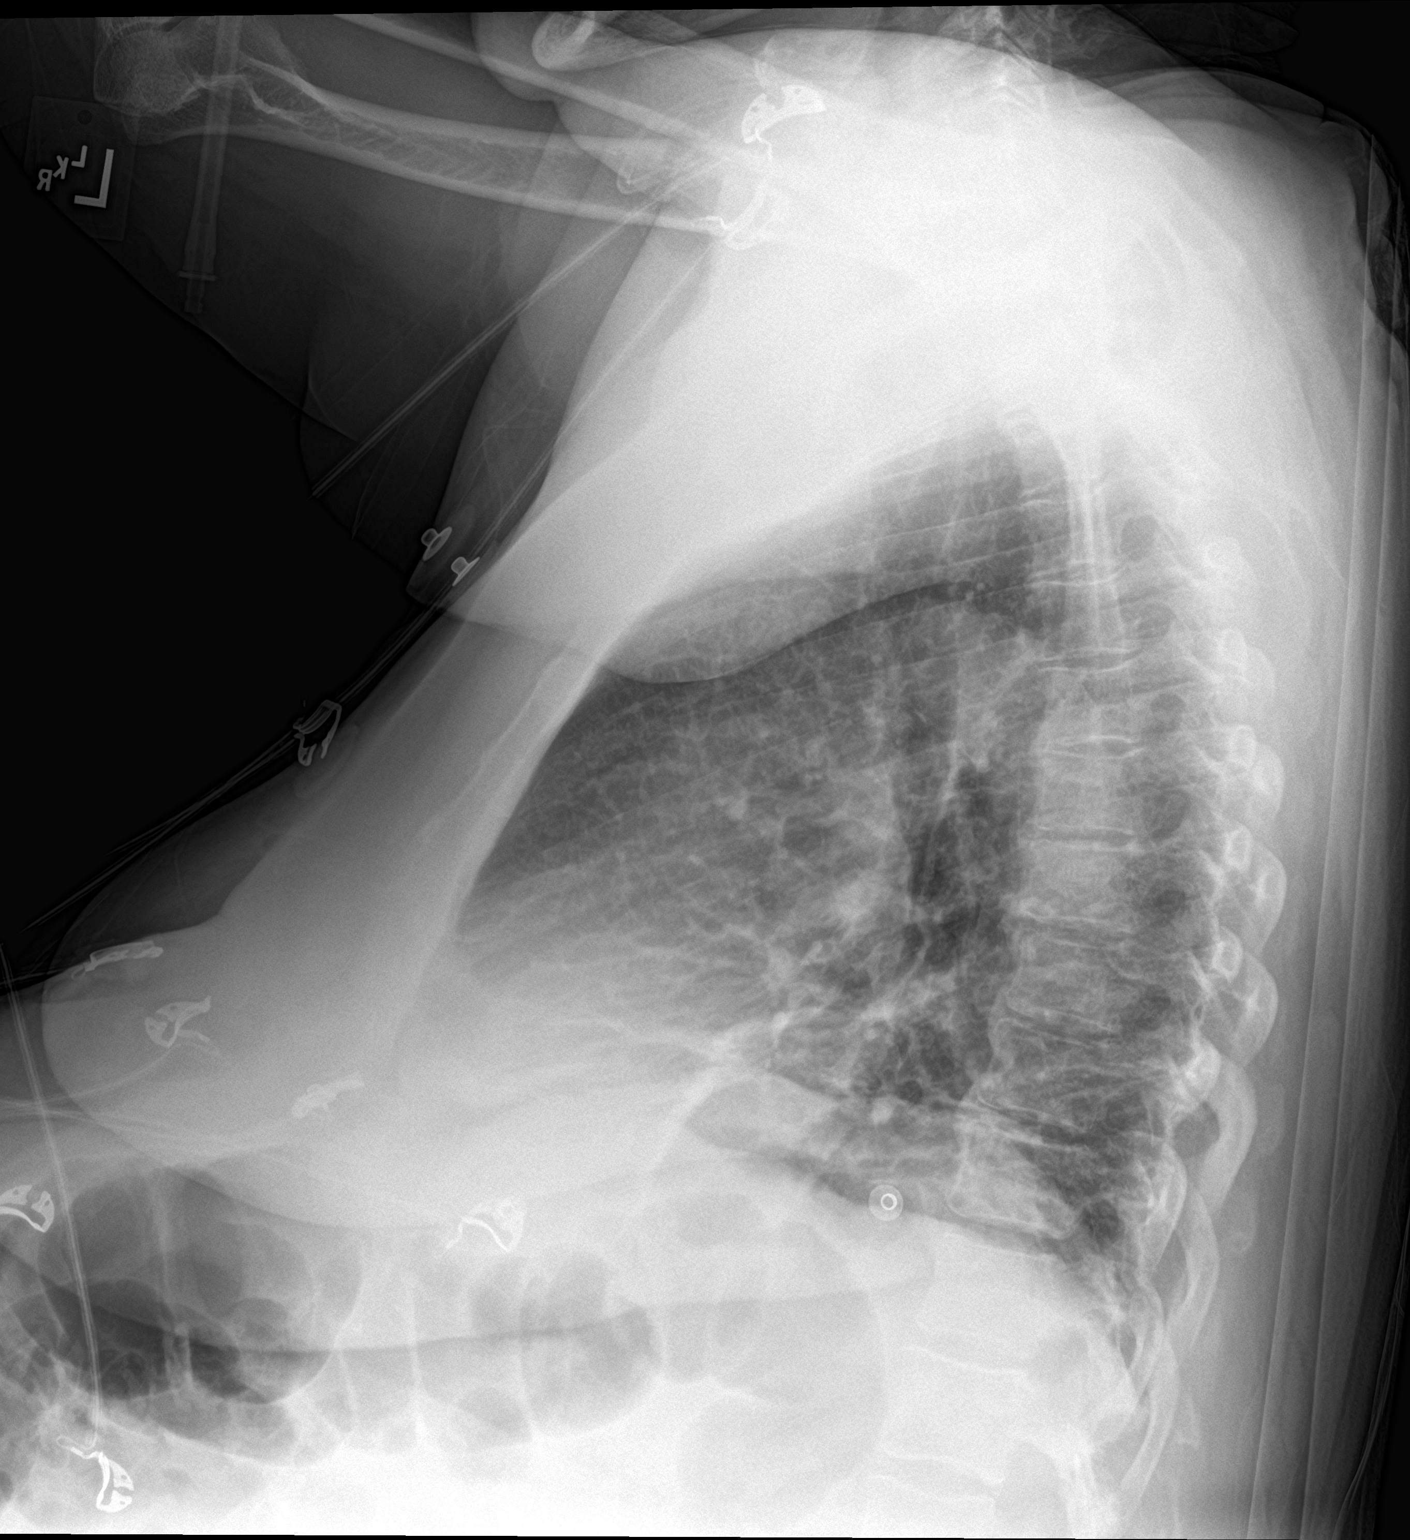

[chest ap]
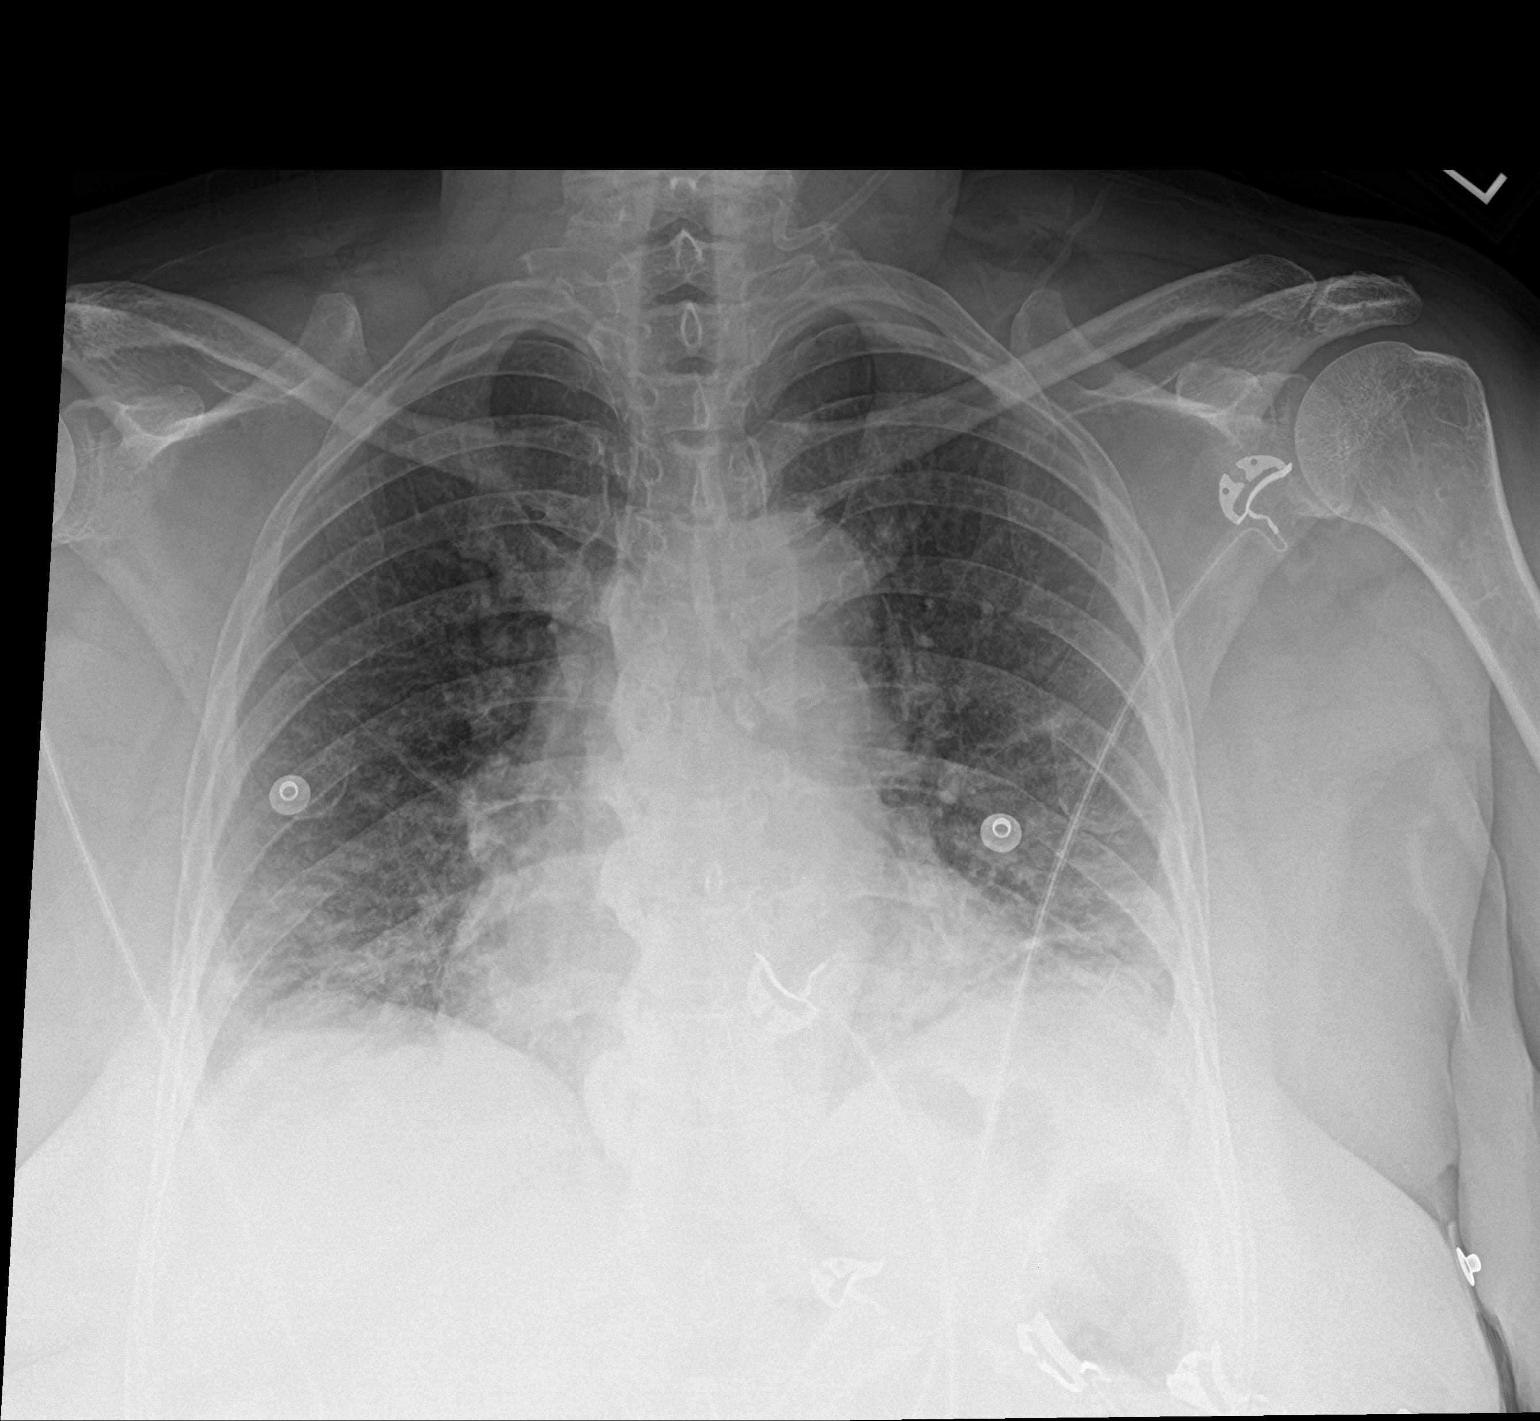

[2 of 2 positions shown; findings below may reference images not displayed]

FINDINGS: The lungs are reasonably well inflated. There is a markings are
increased. There is are areas of atelectasis or linear infiltrate at
the lung bases similar to that seen on [DATE]st. There is no large
pleural effusion. The cardiac silhouette is mildly enlarged. There
is calcification in the wall of the thoracic aorta. There is
multilevel degenerative disc disease of the thoracic spine.
IMPRESSION: Bilateral interstitial pneumonia greatest at the lung bases. No CHF.
The appearance of the chest has deteriorated since the PA and
lateral chest x-ray July 04, 2016 and is slightly worse than on
the the July 09, 2016 study.
